# Patient Record
Sex: Female | Born: 1950
Health system: Southern US, Community
[De-identification: ages and names within clinical notes are randomized; demographics above are authoritative.]

## PROBLEM LIST (undated history)

## (undated) DIAGNOSIS — J449 Chronic obstructive pulmonary disease, unspecified: Secondary | ICD-10-CM

## (undated) DIAGNOSIS — E785 Hyperlipidemia, unspecified: Secondary | ICD-10-CM

## (undated) DIAGNOSIS — E1169 Type 2 diabetes mellitus with other specified complication: Secondary | ICD-10-CM

## (undated) DIAGNOSIS — C50919 Malignant neoplasm of unspecified site of unspecified female breast: Secondary | ICD-10-CM

## (undated) DIAGNOSIS — I10 Essential (primary) hypertension: Secondary | ICD-10-CM

---

## 2006-08-09 ENCOUNTER — Ambulatory Visit: Payer: Self-pay | Admitting: Oncology

## 2006-08-14 ENCOUNTER — Ambulatory Visit: Payer: Self-pay | Admitting: Oncology

## 2006-08-17 ENCOUNTER — Ambulatory Visit: Admission: RE | Admit: 2006-08-17 | Discharge: 2006-10-11 | Payer: Self-pay | Admitting: Radiation Oncology

## 2006-11-01 ENCOUNTER — Ambulatory Visit: Payer: Self-pay | Admitting: Oncology

## 2006-11-07 ENCOUNTER — Ambulatory Visit: Admission: RE | Admit: 2006-11-07 | Discharge: 2006-12-19 | Payer: Self-pay | Admitting: Radiation Oncology

## 2007-02-01 ENCOUNTER — Ambulatory Visit: Payer: Self-pay | Admitting: Oncology

## 2007-02-22 ENCOUNTER — Encounter (INDEPENDENT_AMBULATORY_CARE_PROVIDER_SITE_OTHER): Payer: Self-pay | Admitting: Diagnostic Radiology

## 2007-02-22 ENCOUNTER — Encounter: Admission: RE | Admit: 2007-02-22 | Discharge: 2007-02-22 | Payer: Self-pay | Admitting: Oncology

## 2010-05-29 ENCOUNTER — Encounter: Payer: Self-pay | Admitting: Oncology

## 2014-05-22 DIAGNOSIS — Z23 Encounter for immunization: Secondary | ICD-10-CM | POA: Diagnosis not present

## 2014-05-22 DIAGNOSIS — J449 Chronic obstructive pulmonary disease, unspecified: Secondary | ICD-10-CM | POA: Diagnosis not present

## 2014-05-22 DIAGNOSIS — E780 Pure hypercholesterolemia: Secondary | ICD-10-CM | POA: Diagnosis not present

## 2014-05-22 DIAGNOSIS — E119 Type 2 diabetes mellitus without complications: Secondary | ICD-10-CM | POA: Diagnosis not present

## 2014-05-22 DIAGNOSIS — I5032 Chronic diastolic (congestive) heart failure: Secondary | ICD-10-CM | POA: Diagnosis not present

## 2014-05-22 DIAGNOSIS — I10 Essential (primary) hypertension: Secondary | ICD-10-CM | POA: Diagnosis not present

## 2014-08-21 DIAGNOSIS — Z79899 Other long term (current) drug therapy: Secondary | ICD-10-CM | POA: Diagnosis not present

## 2014-08-21 DIAGNOSIS — I10 Essential (primary) hypertension: Secondary | ICD-10-CM | POA: Diagnosis not present

## 2014-08-21 DIAGNOSIS — Z1239 Encounter for other screening for malignant neoplasm of breast: Secondary | ICD-10-CM | POA: Diagnosis not present

## 2014-08-21 DIAGNOSIS — E780 Pure hypercholesterolemia: Secondary | ICD-10-CM | POA: Diagnosis not present

## 2014-08-21 DIAGNOSIS — J449 Chronic obstructive pulmonary disease, unspecified: Secondary | ICD-10-CM | POA: Diagnosis not present

## 2014-08-21 DIAGNOSIS — I5032 Chronic diastolic (congestive) heart failure: Secondary | ICD-10-CM | POA: Diagnosis not present

## 2014-08-21 DIAGNOSIS — E1129 Type 2 diabetes mellitus with other diabetic kidney complication: Secondary | ICD-10-CM | POA: Diagnosis not present

## 2014-08-26 DIAGNOSIS — J9610 Chronic respiratory failure, unspecified whether with hypoxia or hypercapnia: Secondary | ICD-10-CM | POA: Diagnosis not present

## 2014-08-26 DIAGNOSIS — J449 Chronic obstructive pulmonary disease, unspecified: Secondary | ICD-10-CM | POA: Diagnosis not present

## 2014-09-04 DIAGNOSIS — Z1231 Encounter for screening mammogram for malignant neoplasm of breast: Secondary | ICD-10-CM | POA: Diagnosis not present

## 2015-01-14 DIAGNOSIS — M25561 Pain in right knee: Secondary | ICD-10-CM | POA: Diagnosis not present

## 2015-03-12 DIAGNOSIS — Z23 Encounter for immunization: Secondary | ICD-10-CM | POA: Diagnosis not present

## 2015-06-11 DIAGNOSIS — I10 Essential (primary) hypertension: Secondary | ICD-10-CM | POA: Diagnosis not present

## 2015-06-11 DIAGNOSIS — E1165 Type 2 diabetes mellitus with hyperglycemia: Secondary | ICD-10-CM | POA: Diagnosis not present

## 2015-06-11 DIAGNOSIS — E7800 Pure hypercholesterolemia, unspecified: Secondary | ICD-10-CM | POA: Diagnosis not present

## 2015-06-11 DIAGNOSIS — Z79899 Other long term (current) drug therapy: Secondary | ICD-10-CM | POA: Diagnosis not present

## 2015-06-14 DIAGNOSIS — E7800 Pure hypercholesterolemia, unspecified: Secondary | ICD-10-CM | POA: Diagnosis not present

## 2015-06-14 DIAGNOSIS — E1165 Type 2 diabetes mellitus with hyperglycemia: Secondary | ICD-10-CM | POA: Diagnosis not present

## 2015-06-14 DIAGNOSIS — Z79899 Other long term (current) drug therapy: Secondary | ICD-10-CM | POA: Diagnosis not present

## 2015-07-30 DIAGNOSIS — L98491 Non-pressure chronic ulcer of skin of other sites limited to breakdown of skin: Secondary | ICD-10-CM | POA: Diagnosis not present

## 2015-07-30 DIAGNOSIS — E1129 Type 2 diabetes mellitus with other diabetic kidney complication: Secondary | ICD-10-CM | POA: Diagnosis not present

## 2015-08-04 DIAGNOSIS — L98491 Non-pressure chronic ulcer of skin of other sites limited to breakdown of skin: Secondary | ICD-10-CM | POA: Diagnosis not present

## 2015-09-09 DIAGNOSIS — Z1239 Encounter for other screening for malignant neoplasm of breast: Secondary | ICD-10-CM | POA: Diagnosis not present

## 2015-09-09 DIAGNOSIS — I10 Essential (primary) hypertension: Secondary | ICD-10-CM | POA: Diagnosis not present

## 2015-09-09 DIAGNOSIS — E7800 Pure hypercholesterolemia, unspecified: Secondary | ICD-10-CM | POA: Diagnosis not present

## 2015-09-09 DIAGNOSIS — J449 Chronic obstructive pulmonary disease, unspecified: Secondary | ICD-10-CM | POA: Diagnosis not present

## 2015-09-09 DIAGNOSIS — E1129 Type 2 diabetes mellitus with other diabetic kidney complication: Secondary | ICD-10-CM | POA: Diagnosis not present

## 2015-09-14 DIAGNOSIS — Z1231 Encounter for screening mammogram for malignant neoplasm of breast: Secondary | ICD-10-CM | POA: Diagnosis not present

## 2015-09-21 DIAGNOSIS — N63 Unspecified lump in breast: Secondary | ICD-10-CM | POA: Diagnosis not present

## 2015-09-21 DIAGNOSIS — R928 Other abnormal and inconclusive findings on diagnostic imaging of breast: Secondary | ICD-10-CM | POA: Diagnosis not present

## 2015-09-21 DIAGNOSIS — N6489 Other specified disorders of breast: Secondary | ICD-10-CM | POA: Diagnosis not present

## 2015-10-04 DIAGNOSIS — C50512 Malignant neoplasm of lower-outer quadrant of left female breast: Secondary | ICD-10-CM | POA: Diagnosis not present

## 2015-10-04 DIAGNOSIS — N63 Unspecified lump in breast: Secondary | ICD-10-CM | POA: Diagnosis not present

## 2015-10-04 DIAGNOSIS — C50912 Malignant neoplasm of unspecified site of left female breast: Secondary | ICD-10-CM | POA: Diagnosis not present

## 2015-10-25 DIAGNOSIS — Z9889 Other specified postprocedural states: Secondary | ICD-10-CM | POA: Diagnosis not present

## 2015-10-25 DIAGNOSIS — G4733 Obstructive sleep apnea (adult) (pediatric): Secondary | ICD-10-CM | POA: Diagnosis not present

## 2015-10-25 DIAGNOSIS — J439 Emphysema, unspecified: Secondary | ICD-10-CM | POA: Diagnosis not present

## 2015-10-25 DIAGNOSIS — Z6841 Body Mass Index (BMI) 40.0 and over, adult: Secondary | ICD-10-CM | POA: Diagnosis not present

## 2015-10-25 DIAGNOSIS — C50912 Malignant neoplasm of unspecified site of left female breast: Secondary | ICD-10-CM | POA: Diagnosis not present

## 2015-10-25 DIAGNOSIS — Z923 Personal history of irradiation: Secondary | ICD-10-CM | POA: Diagnosis not present

## 2015-10-29 DIAGNOSIS — G4733 Obstructive sleep apnea (adult) (pediatric): Secondary | ICD-10-CM | POA: Insufficient documentation

## 2015-10-29 DIAGNOSIS — Z923 Personal history of irradiation: Secondary | ICD-10-CM | POA: Insufficient documentation

## 2015-10-29 DIAGNOSIS — J439 Emphysema, unspecified: Secondary | ICD-10-CM | POA: Insufficient documentation

## 2015-10-29 DIAGNOSIS — C773 Secondary and unspecified malignant neoplasm of axilla and upper limb lymph nodes: Secondary | ICD-10-CM

## 2015-10-29 DIAGNOSIS — C50912 Malignant neoplasm of unspecified site of left female breast: Secondary | ICD-10-CM | POA: Insufficient documentation

## 2015-10-29 DIAGNOSIS — E118 Type 2 diabetes mellitus with unspecified complications: Secondary | ICD-10-CM | POA: Insufficient documentation

## 2015-10-29 DIAGNOSIS — Z6841 Body Mass Index (BMI) 40.0 and over, adult: Secondary | ICD-10-CM | POA: Insufficient documentation

## 2015-11-01 DIAGNOSIS — Z01818 Encounter for other preprocedural examination: Secondary | ICD-10-CM | POA: Diagnosis not present

## 2015-11-01 DIAGNOSIS — C50912 Malignant neoplasm of unspecified site of left female breast: Secondary | ICD-10-CM | POA: Diagnosis not present

## 2015-11-02 DIAGNOSIS — C50912 Malignant neoplasm of unspecified site of left female breast: Secondary | ICD-10-CM | POA: Diagnosis not present

## 2015-11-02 DIAGNOSIS — E119 Type 2 diabetes mellitus without complications: Secondary | ICD-10-CM | POA: Diagnosis present

## 2015-11-02 DIAGNOSIS — C50512 Malignant neoplasm of lower-outer quadrant of left female breast: Secondary | ICD-10-CM | POA: Diagnosis not present

## 2015-11-02 DIAGNOSIS — G4733 Obstructive sleep apnea (adult) (pediatric): Secondary | ICD-10-CM | POA: Diagnosis present

## 2015-11-02 DIAGNOSIS — Z6841 Body Mass Index (BMI) 40.0 and over, adult: Secondary | ICD-10-CM | POA: Diagnosis not present

## 2015-11-02 DIAGNOSIS — Z794 Long term (current) use of insulin: Secondary | ICD-10-CM | POA: Diagnosis not present

## 2015-11-02 DIAGNOSIS — I10 Essential (primary) hypertension: Secondary | ICD-10-CM | POA: Diagnosis present

## 2015-11-02 DIAGNOSIS — E785 Hyperlipidemia, unspecified: Secondary | ICD-10-CM | POA: Diagnosis present

## 2015-11-02 DIAGNOSIS — Z7982 Long term (current) use of aspirin: Secondary | ICD-10-CM | POA: Diagnosis not present

## 2015-11-02 DIAGNOSIS — Z803 Family history of malignant neoplasm of breast: Secondary | ICD-10-CM | POA: Diagnosis not present

## 2015-11-02 DIAGNOSIS — Z800 Family history of malignant neoplasm of digestive organs: Secondary | ICD-10-CM | POA: Diagnosis not present

## 2015-11-02 DIAGNOSIS — M1990 Unspecified osteoarthritis, unspecified site: Secondary | ICD-10-CM | POA: Diagnosis present

## 2015-11-02 DIAGNOSIS — Z170 Estrogen receptor positive status [ER+]: Secondary | ICD-10-CM | POA: Diagnosis not present

## 2015-11-02 DIAGNOSIS — J449 Chronic obstructive pulmonary disease, unspecified: Secondary | ICD-10-CM | POA: Diagnosis present

## 2015-11-02 DIAGNOSIS — Z01818 Encounter for other preprocedural examination: Secondary | ICD-10-CM | POA: Diagnosis not present

## 2015-11-19 ENCOUNTER — Encounter: Payer: Self-pay | Admitting: Genetic Counselor

## 2015-11-22 DIAGNOSIS — Z86000 Personal history of in-situ neoplasm of breast: Secondary | ICD-10-CM | POA: Diagnosis not present

## 2015-11-22 DIAGNOSIS — Z803 Family history of malignant neoplasm of breast: Secondary | ICD-10-CM | POA: Diagnosis not present

## 2015-11-22 DIAGNOSIS — C50912 Malignant neoplasm of unspecified site of left female breast: Secondary | ICD-10-CM | POA: Diagnosis not present

## 2015-11-22 DIAGNOSIS — C778 Secondary and unspecified malignant neoplasm of lymph nodes of multiple regions: Secondary | ICD-10-CM | POA: Diagnosis not present

## 2015-11-22 DIAGNOSIS — Z800 Family history of malignant neoplasm of digestive organs: Secondary | ICD-10-CM | POA: Diagnosis not present

## 2015-11-22 DIAGNOSIS — C50512 Malignant neoplasm of lower-outer quadrant of left female breast: Secondary | ICD-10-CM | POA: Diagnosis not present

## 2015-11-23 DIAGNOSIS — C50919 Malignant neoplasm of unspecified site of unspecified female breast: Secondary | ICD-10-CM | POA: Diagnosis not present

## 2015-11-23 DIAGNOSIS — C50912 Malignant neoplasm of unspecified site of left female breast: Secondary | ICD-10-CM | POA: Diagnosis not present

## 2015-11-23 DIAGNOSIS — Z9012 Acquired absence of left breast and nipple: Secondary | ICD-10-CM | POA: Diagnosis not present

## 2015-11-29 DIAGNOSIS — G8918 Other acute postprocedural pain: Secondary | ICD-10-CM | POA: Insufficient documentation

## 2015-12-01 DIAGNOSIS — T451X1A Poisoning by antineoplastic and immunosuppressive drugs, accidental (unintentional), initial encounter: Secondary | ICD-10-CM | POA: Diagnosis not present

## 2015-12-01 DIAGNOSIS — I517 Cardiomegaly: Secondary | ICD-10-CM | POA: Diagnosis not present

## 2015-12-01 DIAGNOSIS — M8589 Other specified disorders of bone density and structure, multiple sites: Secondary | ICD-10-CM | POA: Diagnosis not present

## 2015-12-01 DIAGNOSIS — M8588 Other specified disorders of bone density and structure, other site: Secondary | ICD-10-CM | POA: Diagnosis not present

## 2015-12-01 DIAGNOSIS — C50512 Malignant neoplasm of lower-outer quadrant of left female breast: Secondary | ICD-10-CM | POA: Diagnosis not present

## 2015-12-03 DIAGNOSIS — Z853 Personal history of malignant neoplasm of breast: Secondary | ICD-10-CM | POA: Diagnosis not present

## 2015-12-03 DIAGNOSIS — C50512 Malignant neoplasm of lower-outer quadrant of left female breast: Secondary | ICD-10-CM | POA: Diagnosis not present

## 2015-12-09 DIAGNOSIS — E785 Hyperlipidemia, unspecified: Secondary | ICD-10-CM | POA: Diagnosis not present

## 2015-12-09 DIAGNOSIS — I10 Essential (primary) hypertension: Secondary | ICD-10-CM | POA: Diagnosis not present

## 2015-12-09 DIAGNOSIS — Z86000 Personal history of in-situ neoplasm of breast: Secondary | ICD-10-CM | POA: Diagnosis not present

## 2015-12-09 DIAGNOSIS — Z923 Personal history of irradiation: Secondary | ICD-10-CM | POA: Diagnosis not present

## 2015-12-09 DIAGNOSIS — E118 Type 2 diabetes mellitus with unspecified complications: Secondary | ICD-10-CM | POA: Diagnosis not present

## 2015-12-09 DIAGNOSIS — Z79899 Other long term (current) drug therapy: Secondary | ICD-10-CM | POA: Diagnosis not present

## 2015-12-09 DIAGNOSIS — Z6841 Body Mass Index (BMI) 40.0 and over, adult: Secondary | ICD-10-CM | POA: Diagnosis not present

## 2015-12-09 DIAGNOSIS — Z170 Estrogen receptor positive status [ER+]: Secondary | ICD-10-CM | POA: Diagnosis not present

## 2015-12-09 DIAGNOSIS — J439 Emphysema, unspecified: Secondary | ICD-10-CM | POA: Diagnosis not present

## 2015-12-09 DIAGNOSIS — Z7982 Long term (current) use of aspirin: Secondary | ICD-10-CM | POA: Diagnosis not present

## 2015-12-09 DIAGNOSIS — G4733 Obstructive sleep apnea (adult) (pediatric): Secondary | ICD-10-CM | POA: Diagnosis not present

## 2015-12-09 DIAGNOSIS — T85628A Displacement of other specified internal prosthetic devices, implants and grafts, initial encounter: Secondary | ICD-10-CM | POA: Diagnosis not present

## 2015-12-09 DIAGNOSIS — C50912 Malignant neoplasm of unspecified site of left female breast: Secondary | ICD-10-CM | POA: Diagnosis not present

## 2015-12-09 DIAGNOSIS — D485 Neoplasm of uncertain behavior of skin: Secondary | ICD-10-CM | POA: Diagnosis not present

## 2015-12-09 DIAGNOSIS — E669 Obesity, unspecified: Secondary | ICD-10-CM | POA: Diagnosis not present

## 2015-12-09 DIAGNOSIS — Z9981 Dependence on supplemental oxygen: Secondary | ICD-10-CM | POA: Diagnosis not present

## 2015-12-09 DIAGNOSIS — Z7984 Long term (current) use of oral hypoglycemic drugs: Secondary | ICD-10-CM | POA: Diagnosis not present

## 2015-12-09 DIAGNOSIS — D2361 Other benign neoplasm of skin of right upper limb, including shoulder: Secondary | ICD-10-CM | POA: Diagnosis not present

## 2015-12-09 DIAGNOSIS — Z5111 Encounter for antineoplastic chemotherapy: Secondary | ICD-10-CM | POA: Diagnosis not present

## 2015-12-09 DIAGNOSIS — Z9012 Acquired absence of left breast and nipple: Secondary | ICD-10-CM | POA: Diagnosis not present

## 2015-12-09 DIAGNOSIS — D2261 Melanocytic nevi of right upper limb, including shoulder: Secondary | ICD-10-CM | POA: Diagnosis not present

## 2015-12-09 DIAGNOSIS — Z794 Long term (current) use of insulin: Secondary | ICD-10-CM | POA: Diagnosis not present

## 2015-12-09 DIAGNOSIS — C773 Secondary and unspecified malignant neoplasm of axilla and upper limb lymph nodes: Secondary | ICD-10-CM | POA: Diagnosis not present

## 2015-12-10 DIAGNOSIS — T85628A Displacement of other specified internal prosthetic devices, implants and grafts, initial encounter: Secondary | ICD-10-CM | POA: Diagnosis not present

## 2015-12-10 DIAGNOSIS — Z79899 Other long term (current) drug therapy: Secondary | ICD-10-CM | POA: Diagnosis not present

## 2015-12-10 DIAGNOSIS — C773 Secondary and unspecified malignant neoplasm of axilla and upper limb lymph nodes: Secondary | ICD-10-CM | POA: Diagnosis not present

## 2015-12-10 DIAGNOSIS — C50912 Malignant neoplasm of unspecified site of left female breast: Secondary | ICD-10-CM | POA: Diagnosis not present

## 2015-12-10 DIAGNOSIS — E118 Type 2 diabetes mellitus with unspecified complications: Secondary | ICD-10-CM | POA: Diagnosis not present

## 2015-12-10 DIAGNOSIS — D2361 Other benign neoplasm of skin of right upper limb, including shoulder: Secondary | ICD-10-CM | POA: Diagnosis not present

## 2015-12-29 DIAGNOSIS — Z800 Family history of malignant neoplasm of digestive organs: Secondary | ICD-10-CM | POA: Diagnosis not present

## 2015-12-29 DIAGNOSIS — Z803 Family history of malignant neoplasm of breast: Secondary | ICD-10-CM | POA: Diagnosis not present

## 2015-12-29 DIAGNOSIS — C50512 Malignant neoplasm of lower-outer quadrant of left female breast: Secondary | ICD-10-CM | POA: Diagnosis not present

## 2015-12-29 DIAGNOSIS — M8580 Other specified disorders of bone density and structure, unspecified site: Secondary | ICD-10-CM | POA: Diagnosis not present

## 2015-12-29 DIAGNOSIS — C773 Secondary and unspecified malignant neoplasm of axilla and upper limb lymph nodes: Secondary | ICD-10-CM | POA: Diagnosis not present

## 2015-12-29 DIAGNOSIS — Z853 Personal history of malignant neoplasm of breast: Secondary | ICD-10-CM | POA: Diagnosis not present

## 2016-01-04 DIAGNOSIS — C50212 Malignant neoplasm of upper-inner quadrant of left female breast: Secondary | ICD-10-CM | POA: Diagnosis not present

## 2016-01-05 DIAGNOSIS — C50512 Malignant neoplasm of lower-outer quadrant of left female breast: Secondary | ICD-10-CM | POA: Diagnosis not present

## 2016-01-05 DIAGNOSIS — Z8679 Personal history of other diseases of the circulatory system: Secondary | ICD-10-CM | POA: Diagnosis not present

## 2016-01-05 DIAGNOSIS — Z5111 Encounter for antineoplastic chemotherapy: Secondary | ICD-10-CM | POA: Diagnosis not present

## 2016-01-11 DIAGNOSIS — C50512 Malignant neoplasm of lower-outer quadrant of left female breast: Secondary | ICD-10-CM | POA: Diagnosis not present

## 2016-01-11 DIAGNOSIS — R509 Fever, unspecified: Secondary | ICD-10-CM | POA: Diagnosis not present

## 2016-01-12 ENCOUNTER — Other Ambulatory Visit: Payer: Self-pay

## 2016-01-14 DIAGNOSIS — C50512 Malignant neoplasm of lower-outer quadrant of left female breast: Secondary | ICD-10-CM | POA: Diagnosis not present

## 2016-01-19 DIAGNOSIS — Z23 Encounter for immunization: Secondary | ICD-10-CM | POA: Diagnosis not present

## 2016-01-19 DIAGNOSIS — E7800 Pure hypercholesterolemia, unspecified: Secondary | ICD-10-CM | POA: Diagnosis not present

## 2016-01-19 DIAGNOSIS — I83008 Varicose veins of unspecified lower extremity with ulcer other part of lower leg: Secondary | ICD-10-CM | POA: Diagnosis not present

## 2016-01-19 DIAGNOSIS — J449 Chronic obstructive pulmonary disease, unspecified: Secondary | ICD-10-CM | POA: Diagnosis not present

## 2016-01-19 DIAGNOSIS — E1165 Type 2 diabetes mellitus with hyperglycemia: Secondary | ICD-10-CM | POA: Diagnosis not present

## 2016-01-19 DIAGNOSIS — Z853 Personal history of malignant neoplasm of breast: Secondary | ICD-10-CM | POA: Diagnosis not present

## 2016-01-19 DIAGNOSIS — I10 Essential (primary) hypertension: Secondary | ICD-10-CM | POA: Diagnosis not present

## 2016-01-26 DIAGNOSIS — Z794 Long term (current) use of insulin: Secondary | ICD-10-CM | POA: Diagnosis not present

## 2016-01-26 DIAGNOSIS — C50512 Malignant neoplasm of lower-outer quadrant of left female breast: Secondary | ICD-10-CM | POA: Diagnosis not present

## 2016-01-26 DIAGNOSIS — Z7689 Persons encountering health services in other specified circumstances: Secondary | ICD-10-CM | POA: Diagnosis not present

## 2016-01-26 DIAGNOSIS — E119 Type 2 diabetes mellitus without complications: Secondary | ICD-10-CM | POA: Diagnosis not present

## 2016-01-26 DIAGNOSIS — Z86000 Personal history of in-situ neoplasm of breast: Secondary | ICD-10-CM | POA: Diagnosis not present

## 2016-01-26 DIAGNOSIS — C773 Secondary and unspecified malignant neoplasm of axilla and upper limb lymph nodes: Secondary | ICD-10-CM | POA: Diagnosis not present

## 2016-02-05 DIAGNOSIS — A419 Sepsis, unspecified organism: Secondary | ICD-10-CM | POA: Diagnosis not present

## 2016-02-05 DIAGNOSIS — R42 Dizziness and giddiness: Secondary | ICD-10-CM | POA: Diagnosis not present

## 2016-02-05 DIAGNOSIS — C50911 Malignant neoplasm of unspecified site of right female breast: Secondary | ICD-10-CM | POA: Diagnosis not present

## 2016-02-05 DIAGNOSIS — R000 Tachycardia, unspecified: Secondary | ICD-10-CM | POA: Diagnosis not present

## 2016-02-05 DIAGNOSIS — C50912 Malignant neoplasm of unspecified site of left female breast: Secondary | ICD-10-CM | POA: Diagnosis not present

## 2016-02-05 DIAGNOSIS — R197 Diarrhea, unspecified: Secondary | ICD-10-CM | POA: Diagnosis not present

## 2016-02-05 DIAGNOSIS — E860 Dehydration: Secondary | ICD-10-CM | POA: Diagnosis not present

## 2016-02-06 DIAGNOSIS — I471 Supraventricular tachycardia: Secondary | ICD-10-CM | POA: Diagnosis not present

## 2016-02-06 DIAGNOSIS — E119 Type 2 diabetes mellitus without complications: Secondary | ICD-10-CM | POA: Diagnosis not present

## 2016-02-06 DIAGNOSIS — H00015 Hordeolum externum left lower eyelid: Secondary | ICD-10-CM | POA: Diagnosis not present

## 2016-02-06 DIAGNOSIS — I5032 Chronic diastolic (congestive) heart failure: Secondary | ICD-10-CM | POA: Diagnosis not present

## 2016-02-06 DIAGNOSIS — Z9981 Dependence on supplemental oxygen: Secondary | ICD-10-CM | POA: Diagnosis not present

## 2016-02-06 DIAGNOSIS — I872 Venous insufficiency (chronic) (peripheral): Secondary | ICD-10-CM | POA: Diagnosis not present

## 2016-02-06 DIAGNOSIS — R42 Dizziness and giddiness: Secondary | ICD-10-CM | POA: Diagnosis not present

## 2016-02-06 DIAGNOSIS — Z9012 Acquired absence of left breast and nipple: Secondary | ICD-10-CM | POA: Diagnosis not present

## 2016-02-06 DIAGNOSIS — Z79899 Other long term (current) drug therapy: Secondary | ICD-10-CM | POA: Diagnosis not present

## 2016-02-06 DIAGNOSIS — Z6841 Body Mass Index (BMI) 40.0 and over, adult: Secondary | ICD-10-CM | POA: Diagnosis not present

## 2016-02-06 DIAGNOSIS — Z7982 Long term (current) use of aspirin: Secondary | ICD-10-CM | POA: Diagnosis not present

## 2016-02-06 DIAGNOSIS — C50919 Malignant neoplasm of unspecified site of unspecified female breast: Secondary | ICD-10-CM | POA: Diagnosis not present

## 2016-02-06 DIAGNOSIS — J449 Chronic obstructive pulmonary disease, unspecified: Secondary | ICD-10-CM | POA: Diagnosis not present

## 2016-02-06 DIAGNOSIS — I110 Hypertensive heart disease with heart failure: Secondary | ICD-10-CM | POA: Diagnosis not present

## 2016-02-06 DIAGNOSIS — E860 Dehydration: Secondary | ICD-10-CM | POA: Diagnosis not present

## 2016-02-06 DIAGNOSIS — J9610 Chronic respiratory failure, unspecified whether with hypoxia or hypercapnia: Secondary | ICD-10-CM

## 2016-02-06 DIAGNOSIS — T451X5A Adverse effect of antineoplastic and immunosuppressive drugs, initial encounter: Secondary | ICD-10-CM | POA: Diagnosis not present

## 2016-02-06 DIAGNOSIS — I5030 Unspecified diastolic (congestive) heart failure: Secondary | ICD-10-CM | POA: Diagnosis not present

## 2016-02-06 DIAGNOSIS — Z794 Long term (current) use of insulin: Secondary | ICD-10-CM | POA: Diagnosis not present

## 2016-02-06 DIAGNOSIS — R197 Diarrhea, unspecified: Secondary | ICD-10-CM | POA: Diagnosis not present

## 2016-02-06 DIAGNOSIS — N179 Acute kidney failure, unspecified: Secondary | ICD-10-CM | POA: Diagnosis not present

## 2016-02-06 DIAGNOSIS — R112 Nausea with vomiting, unspecified: Secondary | ICD-10-CM | POA: Diagnosis not present

## 2016-02-07 DIAGNOSIS — J9610 Chronic respiratory failure, unspecified whether with hypoxia or hypercapnia: Secondary | ICD-10-CM | POA: Diagnosis not present

## 2016-02-07 DIAGNOSIS — J449 Chronic obstructive pulmonary disease, unspecified: Secondary | ICD-10-CM | POA: Diagnosis not present

## 2016-02-07 DIAGNOSIS — C50919 Malignant neoplasm of unspecified site of unspecified female breast: Secondary | ICD-10-CM | POA: Diagnosis not present

## 2016-02-07 DIAGNOSIS — I5030 Unspecified diastolic (congestive) heart failure: Secondary | ICD-10-CM | POA: Diagnosis not present

## 2016-02-07 DIAGNOSIS — E860 Dehydration: Secondary | ICD-10-CM | POA: Diagnosis not present

## 2016-02-07 DIAGNOSIS — R112 Nausea with vomiting, unspecified: Secondary | ICD-10-CM | POA: Diagnosis not present

## 2016-02-07 DIAGNOSIS — E119 Type 2 diabetes mellitus without complications: Secondary | ICD-10-CM | POA: Diagnosis not present

## 2016-02-07 DIAGNOSIS — R197 Diarrhea, unspecified: Secondary | ICD-10-CM | POA: Diagnosis not present

## 2016-02-07 DIAGNOSIS — N179 Acute kidney failure, unspecified: Secondary | ICD-10-CM | POA: Diagnosis not present

## 2016-02-14 DIAGNOSIS — H1033 Unspecified acute conjunctivitis, bilateral: Secondary | ICD-10-CM | POA: Diagnosis not present

## 2016-02-16 DIAGNOSIS — K521 Toxic gastroenteritis and colitis: Secondary | ICD-10-CM

## 2016-02-16 DIAGNOSIS — Z86000 Personal history of in-situ neoplasm of breast: Secondary | ICD-10-CM | POA: Diagnosis not present

## 2016-02-16 DIAGNOSIS — C50512 Malignant neoplasm of lower-outer quadrant of left female breast: Secondary | ICD-10-CM | POA: Diagnosis not present

## 2016-02-16 DIAGNOSIS — D098 Carcinoma in situ of other specified sites: Secondary | ICD-10-CM | POA: Diagnosis not present

## 2016-02-16 DIAGNOSIS — D6481 Anemia due to antineoplastic chemotherapy: Secondary | ICD-10-CM

## 2016-02-16 DIAGNOSIS — E860 Dehydration: Secondary | ICD-10-CM | POA: Diagnosis not present

## 2016-02-16 DIAGNOSIS — D0512 Intraductal carcinoma in situ of left breast: Secondary | ICD-10-CM | POA: Diagnosis not present

## 2016-02-18 DIAGNOSIS — C50912 Malignant neoplasm of unspecified site of left female breast: Secondary | ICD-10-CM | POA: Diagnosis not present

## 2016-02-18 DIAGNOSIS — R918 Other nonspecific abnormal finding of lung field: Secondary | ICD-10-CM | POA: Diagnosis not present

## 2016-02-18 DIAGNOSIS — C50512 Malignant neoplasm of lower-outer quadrant of left female breast: Secondary | ICD-10-CM | POA: Diagnosis not present

## 2016-02-21 DIAGNOSIS — H1033 Unspecified acute conjunctivitis, bilateral: Secondary | ICD-10-CM | POA: Diagnosis not present

## 2016-02-21 DIAGNOSIS — I10 Essential (primary) hypertension: Secondary | ICD-10-CM | POA: Diagnosis not present

## 2016-02-21 DIAGNOSIS — S90412A Abrasion, left great toe, initial encounter: Secondary | ICD-10-CM | POA: Diagnosis not present

## 2016-02-21 DIAGNOSIS — E860 Dehydration: Secondary | ICD-10-CM | POA: Diagnosis not present

## 2016-02-23 DIAGNOSIS — C50512 Malignant neoplasm of lower-outer quadrant of left female breast: Secondary | ICD-10-CM | POA: Diagnosis not present

## 2016-02-23 DIAGNOSIS — Z5111 Encounter for antineoplastic chemotherapy: Secondary | ICD-10-CM | POA: Diagnosis not present

## 2016-02-26 DIAGNOSIS — E7800 Pure hypercholesterolemia, unspecified: Secondary | ICD-10-CM | POA: Diagnosis not present

## 2016-02-26 DIAGNOSIS — C50919 Malignant neoplasm of unspecified site of unspecified female breast: Secondary | ICD-10-CM | POA: Diagnosis not present

## 2016-02-26 DIAGNOSIS — E119 Type 2 diabetes mellitus without complications: Secondary | ICD-10-CM | POA: Diagnosis not present

## 2016-02-26 DIAGNOSIS — Z79899 Other long term (current) drug therapy: Secondary | ICD-10-CM | POA: Diagnosis not present

## 2016-02-26 DIAGNOSIS — N189 Chronic kidney disease, unspecified: Secondary | ICD-10-CM | POA: Diagnosis not present

## 2016-02-26 DIAGNOSIS — J449 Chronic obstructive pulmonary disease, unspecified: Secondary | ICD-10-CM | POA: Diagnosis not present

## 2016-02-26 DIAGNOSIS — E11621 Type 2 diabetes mellitus with foot ulcer: Secondary | ICD-10-CM | POA: Diagnosis not present

## 2016-02-26 DIAGNOSIS — Z6841 Body Mass Index (BMI) 40.0 and over, adult: Secondary | ICD-10-CM | POA: Diagnosis not present

## 2016-02-26 DIAGNOSIS — I4892 Unspecified atrial flutter: Secondary | ICD-10-CM | POA: Diagnosis not present

## 2016-02-26 DIAGNOSIS — A419 Sepsis, unspecified organism: Secondary | ICD-10-CM | POA: Diagnosis not present

## 2016-02-26 DIAGNOSIS — R002 Palpitations: Secondary | ICD-10-CM | POA: Diagnosis not present

## 2016-02-26 DIAGNOSIS — L03032 Cellulitis of left toe: Secondary | ICD-10-CM | POA: Diagnosis not present

## 2016-02-26 DIAGNOSIS — R000 Tachycardia, unspecified: Secondary | ICD-10-CM | POA: Diagnosis not present

## 2016-02-26 DIAGNOSIS — C50911 Malignant neoplasm of unspecified site of right female breast: Secondary | ICD-10-CM | POA: Diagnosis not present

## 2016-02-26 DIAGNOSIS — I5030 Unspecified diastolic (congestive) heart failure: Secondary | ICD-10-CM | POA: Diagnosis not present

## 2016-02-26 DIAGNOSIS — I471 Supraventricular tachycardia: Secondary | ICD-10-CM | POA: Diagnosis not present

## 2016-02-26 DIAGNOSIS — I10 Essential (primary) hypertension: Secondary | ICD-10-CM | POA: Diagnosis not present

## 2016-02-26 DIAGNOSIS — S99922A Unspecified injury of left foot, initial encounter: Secondary | ICD-10-CM | POA: Diagnosis not present

## 2016-02-26 DIAGNOSIS — L97529 Non-pressure chronic ulcer of other part of left foot with unspecified severity: Secondary | ICD-10-CM | POA: Diagnosis not present

## 2016-02-26 DIAGNOSIS — I130 Hypertensive heart and chronic kidney disease with heart failure and stage 1 through stage 4 chronic kidney disease, or unspecified chronic kidney disease: Secondary | ICD-10-CM | POA: Diagnosis not present

## 2016-02-26 DIAGNOSIS — C50912 Malignant neoplasm of unspecified site of left female breast: Secondary | ICD-10-CM | POA: Diagnosis not present

## 2016-02-26 DIAGNOSIS — Z794 Long term (current) use of insulin: Secondary | ICD-10-CM | POA: Diagnosis not present

## 2016-02-26 DIAGNOSIS — R110 Nausea: Secondary | ICD-10-CM | POA: Diagnosis not present

## 2016-02-26 DIAGNOSIS — D72829 Elevated white blood cell count, unspecified: Secondary | ICD-10-CM | POA: Diagnosis not present

## 2016-02-26 DIAGNOSIS — I5032 Chronic diastolic (congestive) heart failure: Secondary | ICD-10-CM | POA: Diagnosis not present

## 2016-02-26 DIAGNOSIS — E1122 Type 2 diabetes mellitus with diabetic chronic kidney disease: Secondary | ICD-10-CM | POA: Diagnosis not present

## 2016-02-27 DIAGNOSIS — D72829 Elevated white blood cell count, unspecified: Secondary | ICD-10-CM | POA: Diagnosis not present

## 2016-02-27 DIAGNOSIS — E119 Type 2 diabetes mellitus without complications: Secondary | ICD-10-CM | POA: Diagnosis not present

## 2016-02-27 DIAGNOSIS — I10 Essential (primary) hypertension: Secondary | ICD-10-CM | POA: Diagnosis not present

## 2016-02-27 DIAGNOSIS — L03032 Cellulitis of left toe: Secondary | ICD-10-CM | POA: Diagnosis not present

## 2016-02-27 DIAGNOSIS — I5030 Unspecified diastolic (congestive) heart failure: Secondary | ICD-10-CM | POA: Diagnosis not present

## 2016-02-27 DIAGNOSIS — I471 Supraventricular tachycardia: Secondary | ICD-10-CM | POA: Diagnosis not present

## 2016-02-27 DIAGNOSIS — J449 Chronic obstructive pulmonary disease, unspecified: Secondary | ICD-10-CM | POA: Diagnosis not present

## 2016-02-27 DIAGNOSIS — C50919 Malignant neoplasm of unspecified site of unspecified female breast: Secondary | ICD-10-CM | POA: Diagnosis not present

## 2016-02-28 DIAGNOSIS — D72829 Elevated white blood cell count, unspecified: Secondary | ICD-10-CM | POA: Diagnosis not present

## 2016-02-28 DIAGNOSIS — I471 Supraventricular tachycardia: Secondary | ICD-10-CM | POA: Diagnosis not present

## 2016-02-28 DIAGNOSIS — R000 Tachycardia, unspecified: Secondary | ICD-10-CM | POA: Diagnosis not present

## 2016-02-28 DIAGNOSIS — J449 Chronic obstructive pulmonary disease, unspecified: Secondary | ICD-10-CM | POA: Diagnosis not present

## 2016-02-28 DIAGNOSIS — I10 Essential (primary) hypertension: Secondary | ICD-10-CM | POA: Diagnosis not present

## 2016-02-28 DIAGNOSIS — L03032 Cellulitis of left toe: Secondary | ICD-10-CM | POA: Diagnosis not present

## 2016-02-28 DIAGNOSIS — I5030 Unspecified diastolic (congestive) heart failure: Secondary | ICD-10-CM | POA: Diagnosis not present

## 2016-02-28 DIAGNOSIS — E119 Type 2 diabetes mellitus without complications: Secondary | ICD-10-CM | POA: Diagnosis not present

## 2016-02-28 DIAGNOSIS — C50919 Malignant neoplasm of unspecified site of unspecified female breast: Secondary | ICD-10-CM | POA: Diagnosis not present

## 2016-02-29 DIAGNOSIS — I491 Atrial premature depolarization: Secondary | ICD-10-CM | POA: Diagnosis not present

## 2016-02-29 DIAGNOSIS — I471 Supraventricular tachycardia: Secondary | ICD-10-CM | POA: Diagnosis not present

## 2016-02-29 DIAGNOSIS — I480 Paroxysmal atrial fibrillation: Secondary | ICD-10-CM | POA: Diagnosis not present

## 2016-03-01 DIAGNOSIS — I319 Disease of pericardium, unspecified: Secondary | ICD-10-CM | POA: Diagnosis not present

## 2016-03-01 DIAGNOSIS — I350 Nonrheumatic aortic (valve) stenosis: Secondary | ICD-10-CM | POA: Diagnosis not present

## 2016-03-01 DIAGNOSIS — I5030 Unspecified diastolic (congestive) heart failure: Secondary | ICD-10-CM | POA: Diagnosis not present

## 2016-03-01 DIAGNOSIS — C50919 Malignant neoplasm of unspecified site of unspecified female breast: Secondary | ICD-10-CM | POA: Diagnosis not present

## 2016-03-01 DIAGNOSIS — I471 Supraventricular tachycardia: Secondary | ICD-10-CM | POA: Diagnosis not present

## 2016-03-01 DIAGNOSIS — I480 Paroxysmal atrial fibrillation: Secondary | ICD-10-CM | POA: Diagnosis not present

## 2016-03-01 DIAGNOSIS — E119 Type 2 diabetes mellitus without complications: Secondary | ICD-10-CM | POA: Diagnosis not present

## 2016-03-01 DIAGNOSIS — I10 Essential (primary) hypertension: Secondary | ICD-10-CM | POA: Diagnosis not present

## 2016-03-01 DIAGNOSIS — J449 Chronic obstructive pulmonary disease, unspecified: Secondary | ICD-10-CM | POA: Diagnosis not present

## 2016-03-01 DIAGNOSIS — L03032 Cellulitis of left toe: Secondary | ICD-10-CM | POA: Diagnosis not present

## 2016-03-01 DIAGNOSIS — I119 Hypertensive heart disease without heart failure: Secondary | ICD-10-CM | POA: Diagnosis not present

## 2016-03-01 DIAGNOSIS — D72829 Elevated white blood cell count, unspecified: Secondary | ICD-10-CM | POA: Diagnosis not present

## 2016-03-01 DIAGNOSIS — I361 Nonrheumatic tricuspid (valve) insufficiency: Secondary | ICD-10-CM | POA: Diagnosis not present

## 2016-03-03 DIAGNOSIS — C50512 Malignant neoplasm of lower-outer quadrant of left female breast: Secondary | ICD-10-CM | POA: Diagnosis not present

## 2016-03-08 DIAGNOSIS — C50512 Malignant neoplasm of lower-outer quadrant of left female breast: Secondary | ICD-10-CM | POA: Diagnosis not present

## 2016-03-15 DIAGNOSIS — C773 Secondary and unspecified malignant neoplasm of axilla and upper limb lymph nodes: Secondary | ICD-10-CM | POA: Diagnosis not present

## 2016-03-15 DIAGNOSIS — H109 Unspecified conjunctivitis: Secondary | ICD-10-CM | POA: Diagnosis not present

## 2016-03-15 DIAGNOSIS — C50512 Malignant neoplasm of lower-outer quadrant of left female breast: Secondary | ICD-10-CM | POA: Diagnosis not present

## 2016-03-15 DIAGNOSIS — D6481 Anemia due to antineoplastic chemotherapy: Secondary | ICD-10-CM | POA: Diagnosis not present

## 2016-03-15 DIAGNOSIS — M8580 Other specified disorders of bone density and structure, unspecified site: Secondary | ICD-10-CM | POA: Diagnosis not present

## 2016-03-15 DIAGNOSIS — Z79899 Other long term (current) drug therapy: Secondary | ICD-10-CM | POA: Diagnosis not present

## 2016-03-15 DIAGNOSIS — Z86000 Personal history of in-situ neoplasm of breast: Secondary | ICD-10-CM

## 2016-03-15 DIAGNOSIS — R300 Dysuria: Secondary | ICD-10-CM | POA: Diagnosis not present

## 2016-03-15 DIAGNOSIS — Z0001 Encounter for general adult medical examination with abnormal findings: Secondary | ICD-10-CM | POA: Diagnosis not present

## 2016-03-15 DIAGNOSIS — D649 Anemia, unspecified: Secondary | ICD-10-CM | POA: Diagnosis not present

## 2016-04-03 DIAGNOSIS — I5032 Chronic diastolic (congestive) heart failure: Secondary | ICD-10-CM | POA: Diagnosis not present

## 2016-04-03 DIAGNOSIS — I4892 Unspecified atrial flutter: Secondary | ICD-10-CM | POA: Diagnosis not present

## 2016-04-03 DIAGNOSIS — I10 Essential (primary) hypertension: Secondary | ICD-10-CM | POA: Diagnosis not present

## 2016-04-03 DIAGNOSIS — R0602 Shortness of breath: Secondary | ICD-10-CM | POA: Diagnosis not present

## 2016-04-05 DIAGNOSIS — C50512 Malignant neoplasm of lower-outer quadrant of left female breast: Secondary | ICD-10-CM | POA: Diagnosis not present

## 2016-04-13 DIAGNOSIS — I10 Essential (primary) hypertension: Secondary | ICD-10-CM | POA: Diagnosis not present

## 2016-04-13 DIAGNOSIS — Z6841 Body Mass Index (BMI) 40.0 and over, adult: Secondary | ICD-10-CM | POA: Diagnosis not present

## 2016-04-13 DIAGNOSIS — Z79899 Other long term (current) drug therapy: Secondary | ICD-10-CM | POA: Diagnosis not present

## 2016-04-13 DIAGNOSIS — J449 Chronic obstructive pulmonary disease, unspecified: Secondary | ICD-10-CM | POA: Diagnosis not present

## 2016-04-13 DIAGNOSIS — M25561 Pain in right knee: Secondary | ICD-10-CM | POA: Diagnosis not present

## 2016-04-13 DIAGNOSIS — Z0000 Encounter for general adult medical examination without abnormal findings: Secondary | ICD-10-CM | POA: Diagnosis not present

## 2016-04-13 DIAGNOSIS — Z23 Encounter for immunization: Secondary | ICD-10-CM | POA: Diagnosis not present

## 2016-04-13 DIAGNOSIS — E7800 Pure hypercholesterolemia, unspecified: Secondary | ICD-10-CM | POA: Diagnosis not present

## 2016-04-13 DIAGNOSIS — E1165 Type 2 diabetes mellitus with hyperglycemia: Secondary | ICD-10-CM | POA: Diagnosis not present

## 2016-04-26 DIAGNOSIS — C50512 Malignant neoplasm of lower-outer quadrant of left female breast: Secondary | ICD-10-CM | POA: Diagnosis not present

## 2016-05-17 DIAGNOSIS — Z853 Personal history of malignant neoplasm of breast: Secondary | ICD-10-CM | POA: Diagnosis not present

## 2016-05-17 DIAGNOSIS — Z79811 Long term (current) use of aromatase inhibitors: Secondary | ICD-10-CM | POA: Diagnosis not present

## 2016-05-18 DIAGNOSIS — C50512 Malignant neoplasm of lower-outer quadrant of left female breast: Secondary | ICD-10-CM | POA: Diagnosis not present

## 2016-05-18 DIAGNOSIS — I071 Rheumatic tricuspid insufficiency: Secondary | ICD-10-CM | POA: Diagnosis not present

## 2016-05-18 DIAGNOSIS — T451X1A Poisoning by antineoplastic and immunosuppressive drugs, accidental (unintentional), initial encounter: Secondary | ICD-10-CM | POA: Diagnosis not present

## 2016-05-19 DIAGNOSIS — C50512 Malignant neoplasm of lower-outer quadrant of left female breast: Secondary | ICD-10-CM | POA: Diagnosis not present

## 2016-05-19 DIAGNOSIS — Z5111 Encounter for antineoplastic chemotherapy: Secondary | ICD-10-CM | POA: Diagnosis not present

## 2016-05-29 DIAGNOSIS — L03116 Cellulitis of left lower limb: Secondary | ICD-10-CM | POA: Diagnosis not present

## 2016-05-29 DIAGNOSIS — L03119 Cellulitis of unspecified part of limb: Secondary | ICD-10-CM | POA: Diagnosis not present

## 2016-05-29 DIAGNOSIS — I83008 Varicose veins of unspecified lower extremity with ulcer other part of lower leg: Secondary | ICD-10-CM | POA: Diagnosis not present

## 2016-06-02 DIAGNOSIS — L03116 Cellulitis of left lower limb: Secondary | ICD-10-CM | POA: Diagnosis not present

## 2016-06-02 DIAGNOSIS — I83008 Varicose veins of unspecified lower extremity with ulcer other part of lower leg: Secondary | ICD-10-CM | POA: Diagnosis not present

## 2016-06-07 DIAGNOSIS — Z5111 Encounter for antineoplastic chemotherapy: Secondary | ICD-10-CM | POA: Diagnosis not present

## 2016-06-07 DIAGNOSIS — C50512 Malignant neoplasm of lower-outer quadrant of left female breast: Secondary | ICD-10-CM | POA: Diagnosis not present

## 2016-06-08 DIAGNOSIS — I110 Hypertensive heart disease with heart failure: Secondary | ICD-10-CM | POA: Diagnosis not present

## 2016-06-08 DIAGNOSIS — M1990 Unspecified osteoarthritis, unspecified site: Secondary | ICD-10-CM | POA: Diagnosis not present

## 2016-06-08 DIAGNOSIS — J449 Chronic obstructive pulmonary disease, unspecified: Secondary | ICD-10-CM | POA: Diagnosis not present

## 2016-06-08 DIAGNOSIS — Z794 Long term (current) use of insulin: Secondary | ICD-10-CM | POA: Diagnosis not present

## 2016-06-08 DIAGNOSIS — C50919 Malignant neoplasm of unspecified site of unspecified female breast: Secondary | ICD-10-CM | POA: Diagnosis not present

## 2016-06-08 DIAGNOSIS — L97822 Non-pressure chronic ulcer of other part of left lower leg with fat layer exposed: Secondary | ICD-10-CM | POA: Diagnosis not present

## 2016-06-08 DIAGNOSIS — I509 Heart failure, unspecified: Secondary | ICD-10-CM | POA: Diagnosis not present

## 2016-06-08 DIAGNOSIS — Z79899 Other long term (current) drug therapy: Secondary | ICD-10-CM | POA: Diagnosis not present

## 2016-06-08 DIAGNOSIS — I87312 Chronic venous hypertension (idiopathic) with ulcer of left lower extremity: Secondary | ICD-10-CM | POA: Diagnosis not present

## 2016-06-08 DIAGNOSIS — E11622 Type 2 diabetes mellitus with other skin ulcer: Secondary | ICD-10-CM | POA: Diagnosis not present

## 2016-06-08 DIAGNOSIS — J45909 Unspecified asthma, uncomplicated: Secondary | ICD-10-CM | POA: Diagnosis not present

## 2016-06-13 DIAGNOSIS — R05 Cough: Secondary | ICD-10-CM | POA: Diagnosis not present

## 2016-06-13 DIAGNOSIS — R509 Fever, unspecified: Secondary | ICD-10-CM | POA: Diagnosis not present

## 2016-06-13 DIAGNOSIS — R0602 Shortness of breath: Secondary | ICD-10-CM | POA: Diagnosis not present

## 2016-06-13 DIAGNOSIS — C50512 Malignant neoplasm of lower-outer quadrant of left female breast: Secondary | ICD-10-CM | POA: Diagnosis not present

## 2016-06-15 DIAGNOSIS — I87312 Chronic venous hypertension (idiopathic) with ulcer of left lower extremity: Secondary | ICD-10-CM | POA: Diagnosis not present

## 2016-06-15 DIAGNOSIS — I872 Venous insufficiency (chronic) (peripheral): Secondary | ICD-10-CM | POA: Diagnosis not present

## 2016-06-15 DIAGNOSIS — L97822 Non-pressure chronic ulcer of other part of left lower leg with fat layer exposed: Secondary | ICD-10-CM | POA: Diagnosis not present

## 2016-06-15 DIAGNOSIS — L97821 Non-pressure chronic ulcer of other part of left lower leg limited to breakdown of skin: Secondary | ICD-10-CM | POA: Diagnosis not present

## 2016-06-20 DIAGNOSIS — J449 Chronic obstructive pulmonary disease, unspecified: Secondary | ICD-10-CM | POA: Diagnosis not present

## 2016-06-20 DIAGNOSIS — L97822 Non-pressure chronic ulcer of other part of left lower leg with fat layer exposed: Secondary | ICD-10-CM | POA: Diagnosis not present

## 2016-06-20 DIAGNOSIS — I87312 Chronic venous hypertension (idiopathic) with ulcer of left lower extremity: Secondary | ICD-10-CM | POA: Diagnosis not present

## 2016-06-20 DIAGNOSIS — I110 Hypertensive heart disease with heart failure: Secondary | ICD-10-CM | POA: Diagnosis not present

## 2016-06-20 DIAGNOSIS — J45909 Unspecified asthma, uncomplicated: Secondary | ICD-10-CM | POA: Diagnosis not present

## 2016-06-20 DIAGNOSIS — E11621 Type 2 diabetes mellitus with foot ulcer: Secondary | ICD-10-CM | POA: Diagnosis not present

## 2016-06-20 DIAGNOSIS — L97222 Non-pressure chronic ulcer of left calf with fat layer exposed: Secondary | ICD-10-CM | POA: Diagnosis not present

## 2016-06-20 DIAGNOSIS — M1990 Unspecified osteoarthritis, unspecified site: Secondary | ICD-10-CM | POA: Diagnosis not present

## 2016-06-20 DIAGNOSIS — I509 Heart failure, unspecified: Secondary | ICD-10-CM | POA: Diagnosis not present

## 2016-06-20 DIAGNOSIS — Z9221 Personal history of antineoplastic chemotherapy: Secondary | ICD-10-CM | POA: Diagnosis not present

## 2016-06-20 DIAGNOSIS — I87321 Chronic venous hypertension (idiopathic) with inflammation of right lower extremity: Secondary | ICD-10-CM | POA: Diagnosis not present

## 2016-06-20 DIAGNOSIS — L97129 Non-pressure chronic ulcer of left thigh with unspecified severity: Secondary | ICD-10-CM | POA: Diagnosis not present

## 2016-06-26 DIAGNOSIS — I87312 Chronic venous hypertension (idiopathic) with ulcer of left lower extremity: Secondary | ICD-10-CM | POA: Diagnosis not present

## 2016-06-26 DIAGNOSIS — Z09 Encounter for follow-up examination after completed treatment for conditions other than malignant neoplasm: Secondary | ICD-10-CM | POA: Diagnosis not present

## 2016-06-26 DIAGNOSIS — L97829 Non-pressure chronic ulcer of other part of left lower leg with unspecified severity: Secondary | ICD-10-CM | POA: Diagnosis not present

## 2016-06-28 DIAGNOSIS — C50512 Malignant neoplasm of lower-outer quadrant of left female breast: Secondary | ICD-10-CM | POA: Diagnosis not present

## 2016-06-29 DIAGNOSIS — I87312 Chronic venous hypertension (idiopathic) with ulcer of left lower extremity: Secondary | ICD-10-CM | POA: Diagnosis not present

## 2016-06-29 DIAGNOSIS — I10 Essential (primary) hypertension: Secondary | ICD-10-CM | POA: Diagnosis not present

## 2016-06-29 DIAGNOSIS — I70202 Unspecified atherosclerosis of native arteries of extremities, left leg: Secondary | ICD-10-CM | POA: Diagnosis not present

## 2016-07-19 DIAGNOSIS — C773 Secondary and unspecified malignant neoplasm of axilla and upper limb lymph nodes: Secondary | ICD-10-CM | POA: Diagnosis not present

## 2016-07-19 DIAGNOSIS — M25562 Pain in left knee: Secondary | ICD-10-CM | POA: Diagnosis not present

## 2016-07-19 DIAGNOSIS — C50512 Malignant neoplasm of lower-outer quadrant of left female breast: Secondary | ICD-10-CM | POA: Diagnosis not present

## 2016-07-19 DIAGNOSIS — M25561 Pain in right knee: Secondary | ICD-10-CM | POA: Diagnosis not present

## 2016-07-19 DIAGNOSIS — M8580 Other specified disorders of bone density and structure, unspecified site: Secondary | ICD-10-CM | POA: Diagnosis not present

## 2016-07-19 DIAGNOSIS — Z86000 Personal history of in-situ neoplasm of breast: Secondary | ICD-10-CM | POA: Diagnosis not present

## 2016-07-19 DIAGNOSIS — C50912 Malignant neoplasm of unspecified site of left female breast: Secondary | ICD-10-CM | POA: Diagnosis not present

## 2016-07-19 DIAGNOSIS — Z800 Family history of malignant neoplasm of digestive organs: Secondary | ICD-10-CM | POA: Diagnosis not present

## 2016-07-31 DIAGNOSIS — I872 Venous insufficiency (chronic) (peripheral): Secondary | ICD-10-CM | POA: Diagnosis not present

## 2016-07-31 DIAGNOSIS — L97811 Non-pressure chronic ulcer of other part of right lower leg limited to breakdown of skin: Secondary | ICD-10-CM | POA: Diagnosis not present

## 2016-07-31 DIAGNOSIS — L97822 Non-pressure chronic ulcer of other part of left lower leg with fat layer exposed: Secondary | ICD-10-CM | POA: Diagnosis not present

## 2016-07-31 DIAGNOSIS — L97821 Non-pressure chronic ulcer of other part of left lower leg limited to breakdown of skin: Secondary | ICD-10-CM | POA: Diagnosis not present

## 2016-07-31 DIAGNOSIS — I87313 Chronic venous hypertension (idiopathic) with ulcer of bilateral lower extremity: Secondary | ICD-10-CM | POA: Diagnosis not present

## 2016-07-31 DIAGNOSIS — I890 Lymphedema, not elsewhere classified: Secondary | ICD-10-CM | POA: Diagnosis not present

## 2016-08-07 DIAGNOSIS — L97821 Non-pressure chronic ulcer of other part of left lower leg limited to breakdown of skin: Secondary | ICD-10-CM | POA: Diagnosis not present

## 2016-08-07 DIAGNOSIS — L97822 Non-pressure chronic ulcer of other part of left lower leg with fat layer exposed: Secondary | ICD-10-CM | POA: Diagnosis not present

## 2016-08-07 DIAGNOSIS — I872 Venous insufficiency (chronic) (peripheral): Secondary | ICD-10-CM | POA: Diagnosis not present

## 2016-08-07 DIAGNOSIS — I890 Lymphedema, not elsewhere classified: Secondary | ICD-10-CM | POA: Diagnosis not present

## 2016-08-07 DIAGNOSIS — I87312 Chronic venous hypertension (idiopathic) with ulcer of left lower extremity: Secondary | ICD-10-CM | POA: Diagnosis not present

## 2016-08-09 DIAGNOSIS — C50212 Malignant neoplasm of upper-inner quadrant of left female breast: Secondary | ICD-10-CM | POA: Diagnosis not present

## 2016-08-14 DIAGNOSIS — I872 Venous insufficiency (chronic) (peripheral): Secondary | ICD-10-CM | POA: Diagnosis not present

## 2016-08-14 DIAGNOSIS — I890 Lymphedema, not elsewhere classified: Secondary | ICD-10-CM | POA: Diagnosis not present

## 2016-08-14 DIAGNOSIS — L97822 Non-pressure chronic ulcer of other part of left lower leg with fat layer exposed: Secondary | ICD-10-CM | POA: Diagnosis not present

## 2016-08-14 DIAGNOSIS — I87312 Chronic venous hypertension (idiopathic) with ulcer of left lower extremity: Secondary | ICD-10-CM | POA: Diagnosis not present

## 2016-08-14 DIAGNOSIS — L97821 Non-pressure chronic ulcer of other part of left lower leg limited to breakdown of skin: Secondary | ICD-10-CM | POA: Diagnosis not present

## 2016-08-29 DIAGNOSIS — I361 Nonrheumatic tricuspid (valve) insufficiency: Secondary | ICD-10-CM | POA: Diagnosis not present

## 2016-08-29 DIAGNOSIS — C50512 Malignant neoplasm of lower-outer quadrant of left female breast: Secondary | ICD-10-CM | POA: Diagnosis not present

## 2016-08-29 DIAGNOSIS — L97829 Non-pressure chronic ulcer of other part of left lower leg with unspecified severity: Secondary | ICD-10-CM | POA: Diagnosis not present

## 2016-08-29 DIAGNOSIS — I071 Rheumatic tricuspid insufficiency: Secondary | ICD-10-CM | POA: Diagnosis not present

## 2016-08-29 DIAGNOSIS — T451X1D Poisoning by antineoplastic and immunosuppressive drugs, accidental (unintentional), subsequent encounter: Secondary | ICD-10-CM | POA: Diagnosis not present

## 2016-08-29 DIAGNOSIS — I358 Other nonrheumatic aortic valve disorders: Secondary | ICD-10-CM | POA: Diagnosis not present

## 2016-08-29 DIAGNOSIS — I272 Pulmonary hypertension, unspecified: Secondary | ICD-10-CM | POA: Diagnosis not present

## 2016-08-29 DIAGNOSIS — I890 Lymphedema, not elsewhere classified: Secondary | ICD-10-CM | POA: Diagnosis not present

## 2016-08-29 DIAGNOSIS — I87312 Chronic venous hypertension (idiopathic) with ulcer of left lower extremity: Secondary | ICD-10-CM | POA: Diagnosis not present

## 2016-08-30 DIAGNOSIS — C50512 Malignant neoplasm of lower-outer quadrant of left female breast: Secondary | ICD-10-CM | POA: Diagnosis not present

## 2016-08-30 DIAGNOSIS — R609 Edema, unspecified: Secondary | ICD-10-CM | POA: Diagnosis not present

## 2016-08-30 DIAGNOSIS — C50912 Malignant neoplasm of unspecified site of left female breast: Secondary | ICD-10-CM | POA: Diagnosis not present

## 2016-08-30 DIAGNOSIS — Z9012 Acquired absence of left breast and nipple: Secondary | ICD-10-CM | POA: Diagnosis not present

## 2016-08-30 DIAGNOSIS — Z170 Estrogen receptor positive status [ER+]: Secondary | ICD-10-CM | POA: Diagnosis not present

## 2016-08-30 DIAGNOSIS — B372 Candidiasis of skin and nail: Secondary | ICD-10-CM | POA: Diagnosis not present

## 2016-09-05 DIAGNOSIS — I87312 Chronic venous hypertension (idiopathic) with ulcer of left lower extremity: Secondary | ICD-10-CM | POA: Diagnosis not present

## 2016-09-05 DIAGNOSIS — L97222 Non-pressure chronic ulcer of left calf with fat layer exposed: Secondary | ICD-10-CM | POA: Diagnosis not present

## 2016-09-05 DIAGNOSIS — I872 Venous insufficiency (chronic) (peripheral): Secondary | ICD-10-CM | POA: Diagnosis not present

## 2016-09-05 DIAGNOSIS — L97821 Non-pressure chronic ulcer of other part of left lower leg limited to breakdown of skin: Secondary | ICD-10-CM | POA: Diagnosis not present

## 2016-09-12 DIAGNOSIS — I890 Lymphedema, not elsewhere classified: Secondary | ICD-10-CM | POA: Diagnosis not present

## 2016-09-12 DIAGNOSIS — I87313 Chronic venous hypertension (idiopathic) with ulcer of bilateral lower extremity: Secondary | ICD-10-CM | POA: Diagnosis not present

## 2016-09-12 DIAGNOSIS — Z09 Encounter for follow-up examination after completed treatment for conditions other than malignant neoplasm: Secondary | ICD-10-CM | POA: Diagnosis not present

## 2016-09-12 DIAGNOSIS — L97819 Non-pressure chronic ulcer of other part of right lower leg with unspecified severity: Secondary | ICD-10-CM | POA: Diagnosis not present

## 2016-09-12 DIAGNOSIS — L97229 Non-pressure chronic ulcer of left calf with unspecified severity: Secondary | ICD-10-CM | POA: Diagnosis not present

## 2016-09-20 DIAGNOSIS — C50419 Malignant neoplasm of upper-outer quadrant of unspecified female breast: Secondary | ICD-10-CM | POA: Diagnosis not present

## 2016-09-20 DIAGNOSIS — C50512 Malignant neoplasm of lower-outer quadrant of left female breast: Secondary | ICD-10-CM | POA: Diagnosis not present

## 2016-09-20 DIAGNOSIS — C773 Secondary and unspecified malignant neoplasm of axilla and upper limb lymph nodes: Secondary | ICD-10-CM | POA: Diagnosis not present

## 2016-10-11 DIAGNOSIS — C50512 Malignant neoplasm of lower-outer quadrant of left female breast: Secondary | ICD-10-CM | POA: Diagnosis not present

## 2016-10-18 DIAGNOSIS — E119 Type 2 diabetes mellitus without complications: Secondary | ICD-10-CM | POA: Diagnosis not present

## 2016-10-18 DIAGNOSIS — L97222 Non-pressure chronic ulcer of left calf with fat layer exposed: Secondary | ICD-10-CM | POA: Diagnosis not present

## 2016-10-18 DIAGNOSIS — I509 Heart failure, unspecified: Secondary | ICD-10-CM | POA: Diagnosis not present

## 2016-10-18 DIAGNOSIS — I110 Hypertensive heart disease with heart failure: Secondary | ICD-10-CM | POA: Diagnosis not present

## 2016-10-18 DIAGNOSIS — L97212 Non-pressure chronic ulcer of right calf with fat layer exposed: Secondary | ICD-10-CM | POA: Diagnosis not present

## 2016-10-18 DIAGNOSIS — J449 Chronic obstructive pulmonary disease, unspecified: Secondary | ICD-10-CM | POA: Diagnosis not present

## 2016-10-18 DIAGNOSIS — I890 Lymphedema, not elsewhere classified: Secondary | ICD-10-CM | POA: Diagnosis not present

## 2016-10-18 DIAGNOSIS — I87313 Chronic venous hypertension (idiopathic) with ulcer of bilateral lower extremity: Secondary | ICD-10-CM | POA: Diagnosis not present

## 2016-10-18 DIAGNOSIS — L97822 Non-pressure chronic ulcer of other part of left lower leg with fat layer exposed: Secondary | ICD-10-CM | POA: Diagnosis not present

## 2016-10-18 DIAGNOSIS — J45909 Unspecified asthma, uncomplicated: Secondary | ICD-10-CM | POA: Diagnosis not present

## 2016-10-18 DIAGNOSIS — L97812 Non-pressure chronic ulcer of other part of right lower leg with fat layer exposed: Secondary | ICD-10-CM | POA: Diagnosis not present

## 2016-10-18 DIAGNOSIS — Z794 Long term (current) use of insulin: Secondary | ICD-10-CM | POA: Diagnosis not present

## 2016-10-18 DIAGNOSIS — M1990 Unspecified osteoarthritis, unspecified site: Secondary | ICD-10-CM | POA: Diagnosis not present

## 2016-10-25 DIAGNOSIS — I872 Venous insufficiency (chronic) (peripheral): Secondary | ICD-10-CM | POA: Diagnosis not present

## 2016-10-25 DIAGNOSIS — E11622 Type 2 diabetes mellitus with other skin ulcer: Secondary | ICD-10-CM | POA: Diagnosis not present

## 2016-10-25 DIAGNOSIS — L97222 Non-pressure chronic ulcer of left calf with fat layer exposed: Secondary | ICD-10-CM | POA: Diagnosis not present

## 2016-10-25 DIAGNOSIS — L97812 Non-pressure chronic ulcer of other part of right lower leg with fat layer exposed: Secondary | ICD-10-CM | POA: Diagnosis not present

## 2016-10-25 DIAGNOSIS — I87313 Chronic venous hypertension (idiopathic) with ulcer of bilateral lower extremity: Secondary | ICD-10-CM | POA: Diagnosis not present

## 2016-10-25 DIAGNOSIS — Z794 Long term (current) use of insulin: Secondary | ICD-10-CM | POA: Diagnosis not present

## 2016-10-25 DIAGNOSIS — L97822 Non-pressure chronic ulcer of other part of left lower leg with fat layer exposed: Secondary | ICD-10-CM | POA: Diagnosis not present

## 2016-10-25 DIAGNOSIS — I890 Lymphedema, not elsewhere classified: Secondary | ICD-10-CM | POA: Diagnosis not present

## 2016-10-25 DIAGNOSIS — L97212 Non-pressure chronic ulcer of right calf with fat layer exposed: Secondary | ICD-10-CM | POA: Diagnosis not present

## 2016-11-01 DIAGNOSIS — C50512 Malignant neoplasm of lower-outer quadrant of left female breast: Secondary | ICD-10-CM | POA: Diagnosis not present

## 2016-11-01 DIAGNOSIS — I87313 Chronic venous hypertension (idiopathic) with ulcer of bilateral lower extremity: Secondary | ICD-10-CM | POA: Diagnosis not present

## 2016-11-01 DIAGNOSIS — L97829 Non-pressure chronic ulcer of other part of left lower leg with unspecified severity: Secondary | ICD-10-CM | POA: Diagnosis not present

## 2016-11-01 DIAGNOSIS — L97819 Non-pressure chronic ulcer of other part of right lower leg with unspecified severity: Secondary | ICD-10-CM | POA: Diagnosis not present

## 2016-11-07 DIAGNOSIS — I890 Lymphedema, not elsewhere classified: Secondary | ICD-10-CM | POA: Diagnosis not present

## 2016-11-07 DIAGNOSIS — L97222 Non-pressure chronic ulcer of left calf with fat layer exposed: Secondary | ICD-10-CM | POA: Diagnosis not present

## 2016-11-07 DIAGNOSIS — L97822 Non-pressure chronic ulcer of other part of left lower leg with fat layer exposed: Secondary | ICD-10-CM | POA: Diagnosis not present

## 2016-11-07 DIAGNOSIS — I87313 Chronic venous hypertension (idiopathic) with ulcer of bilateral lower extremity: Secondary | ICD-10-CM | POA: Diagnosis not present

## 2016-11-07 DIAGNOSIS — I872 Venous insufficiency (chronic) (peripheral): Secondary | ICD-10-CM | POA: Diagnosis not present

## 2016-11-07 DIAGNOSIS — E11622 Type 2 diabetes mellitus with other skin ulcer: Secondary | ICD-10-CM | POA: Diagnosis not present

## 2016-11-13 DIAGNOSIS — I319 Disease of pericardium, unspecified: Secondary | ICD-10-CM | POA: Diagnosis not present

## 2016-11-13 DIAGNOSIS — T451X1D Poisoning by antineoplastic and immunosuppressive drugs, accidental (unintentional), subsequent encounter: Secondary | ICD-10-CM | POA: Diagnosis not present

## 2016-11-13 DIAGNOSIS — C50512 Malignant neoplasm of lower-outer quadrant of left female breast: Secondary | ICD-10-CM | POA: Diagnosis not present

## 2016-11-14 DIAGNOSIS — I872 Venous insufficiency (chronic) (peripheral): Secondary | ICD-10-CM | POA: Diagnosis not present

## 2016-11-14 DIAGNOSIS — I87312 Chronic venous hypertension (idiopathic) with ulcer of left lower extremity: Secondary | ICD-10-CM | POA: Diagnosis not present

## 2016-11-14 DIAGNOSIS — L97222 Non-pressure chronic ulcer of left calf with fat layer exposed: Secondary | ICD-10-CM | POA: Diagnosis not present

## 2016-11-21 DIAGNOSIS — I87311 Chronic venous hypertension (idiopathic) with ulcer of right lower extremity: Secondary | ICD-10-CM | POA: Diagnosis not present

## 2016-11-21 DIAGNOSIS — L97812 Non-pressure chronic ulcer of other part of right lower leg with fat layer exposed: Secondary | ICD-10-CM | POA: Diagnosis not present

## 2016-11-22 DIAGNOSIS — Z9012 Acquired absence of left breast and nipple: Secondary | ICD-10-CM | POA: Diagnosis not present

## 2016-11-22 DIAGNOSIS — C50912 Malignant neoplasm of unspecified site of left female breast: Secondary | ICD-10-CM | POA: Diagnosis not present

## 2016-11-22 DIAGNOSIS — D649 Anemia, unspecified: Secondary | ICD-10-CM | POA: Diagnosis not present

## 2016-11-22 DIAGNOSIS — C50512 Malignant neoplasm of lower-outer quadrant of left female breast: Secondary | ICD-10-CM | POA: Diagnosis not present

## 2016-11-22 DIAGNOSIS — Z170 Estrogen receptor positive status [ER+]: Secondary | ICD-10-CM | POA: Diagnosis not present

## 2016-11-28 DIAGNOSIS — R0902 Hypoxemia: Secondary | ICD-10-CM | POA: Diagnosis not present

## 2016-11-28 DIAGNOSIS — I872 Venous insufficiency (chronic) (peripheral): Secondary | ICD-10-CM | POA: Diagnosis not present

## 2016-11-28 DIAGNOSIS — I87311 Chronic venous hypertension (idiopathic) with ulcer of right lower extremity: Secondary | ICD-10-CM | POA: Diagnosis not present

## 2016-11-28 DIAGNOSIS — R51 Headache: Secondary | ICD-10-CM | POA: Diagnosis not present

## 2016-11-28 DIAGNOSIS — L97812 Non-pressure chronic ulcer of other part of right lower leg with fat layer exposed: Secondary | ICD-10-CM | POA: Diagnosis not present

## 2016-11-28 DIAGNOSIS — R42 Dizziness and giddiness: Secondary | ICD-10-CM | POA: Diagnosis not present

## 2016-11-28 DIAGNOSIS — J449 Chronic obstructive pulmonary disease, unspecified: Secondary | ICD-10-CM | POA: Diagnosis not present

## 2016-12-05 DIAGNOSIS — L97812 Non-pressure chronic ulcer of other part of right lower leg with fat layer exposed: Secondary | ICD-10-CM | POA: Diagnosis not present

## 2016-12-05 DIAGNOSIS — I872 Venous insufficiency (chronic) (peripheral): Secondary | ICD-10-CM | POA: Diagnosis not present

## 2016-12-05 DIAGNOSIS — I87311 Chronic venous hypertension (idiopathic) with ulcer of right lower extremity: Secondary | ICD-10-CM | POA: Diagnosis not present

## 2016-12-05 DIAGNOSIS — I890 Lymphedema, not elsewhere classified: Secondary | ICD-10-CM | POA: Diagnosis not present

## 2016-12-06 DIAGNOSIS — I4819 Other persistent atrial fibrillation: Secondary | ICD-10-CM | POA: Insufficient documentation

## 2016-12-06 DIAGNOSIS — Z6841 Body Mass Index (BMI) 40.0 and over, adult: Secondary | ICD-10-CM | POA: Diagnosis not present

## 2016-12-06 DIAGNOSIS — Z794 Long term (current) use of insulin: Secondary | ICD-10-CM | POA: Diagnosis not present

## 2016-12-06 DIAGNOSIS — J438 Other emphysema: Secondary | ICD-10-CM | POA: Diagnosis not present

## 2016-12-06 DIAGNOSIS — D0511 Intraductal carcinoma in situ of right breast: Secondary | ICD-10-CM | POA: Diagnosis not present

## 2016-12-06 DIAGNOSIS — I481 Persistent atrial fibrillation: Secondary | ICD-10-CM | POA: Diagnosis not present

## 2016-12-06 DIAGNOSIS — E118 Type 2 diabetes mellitus with unspecified complications: Secondary | ICD-10-CM | POA: Diagnosis not present

## 2016-12-06 DIAGNOSIS — I272 Pulmonary hypertension, unspecified: Secondary | ICD-10-CM | POA: Insufficient documentation

## 2016-12-13 DIAGNOSIS — I872 Venous insufficiency (chronic) (peripheral): Secondary | ICD-10-CM | POA: Diagnosis not present

## 2016-12-13 DIAGNOSIS — I87311 Chronic venous hypertension (idiopathic) with ulcer of right lower extremity: Secondary | ICD-10-CM | POA: Diagnosis not present

## 2016-12-13 DIAGNOSIS — C50512 Malignant neoplasm of lower-outer quadrant of left female breast: Secondary | ICD-10-CM | POA: Diagnosis not present

## 2016-12-13 DIAGNOSIS — Z79811 Long term (current) use of aromatase inhibitors: Secondary | ICD-10-CM | POA: Diagnosis not present

## 2016-12-13 DIAGNOSIS — I890 Lymphedema, not elsewhere classified: Secondary | ICD-10-CM | POA: Diagnosis not present

## 2016-12-13 DIAGNOSIS — L97812 Non-pressure chronic ulcer of other part of right lower leg with fat layer exposed: Secondary | ICD-10-CM | POA: Diagnosis not present

## 2016-12-13 DIAGNOSIS — R531 Weakness: Secondary | ICD-10-CM | POA: Diagnosis not present

## 2016-12-25 DIAGNOSIS — Z09 Encounter for follow-up examination after completed treatment for conditions other than malignant neoplasm: Secondary | ICD-10-CM | POA: Diagnosis not present

## 2016-12-25 DIAGNOSIS — L97819 Non-pressure chronic ulcer of other part of right lower leg with unspecified severity: Secondary | ICD-10-CM | POA: Diagnosis not present

## 2016-12-25 DIAGNOSIS — I890 Lymphedema, not elsewhere classified: Secondary | ICD-10-CM | POA: Diagnosis not present

## 2016-12-25 DIAGNOSIS — I87311 Chronic venous hypertension (idiopathic) with ulcer of right lower extremity: Secondary | ICD-10-CM | POA: Diagnosis not present

## 2016-12-31 DIAGNOSIS — J9621 Acute and chronic respiratory failure with hypoxia: Secondary | ICD-10-CM | POA: Diagnosis not present

## 2016-12-31 DIAGNOSIS — J441 Chronic obstructive pulmonary disease with (acute) exacerbation: Secondary | ICD-10-CM | POA: Diagnosis not present

## 2016-12-31 DIAGNOSIS — Z6841 Body Mass Index (BMI) 40.0 and over, adult: Secondary | ICD-10-CM | POA: Diagnosis not present

## 2016-12-31 DIAGNOSIS — I482 Chronic atrial fibrillation: Secondary | ICD-10-CM | POA: Diagnosis present

## 2016-12-31 DIAGNOSIS — I110 Hypertensive heart disease with heart failure: Secondary | ICD-10-CM | POA: Diagnosis present

## 2016-12-31 DIAGNOSIS — J9610 Chronic respiratory failure, unspecified whether with hypoxia or hypercapnia: Secondary | ICD-10-CM | POA: Diagnosis not present

## 2016-12-31 DIAGNOSIS — J189 Pneumonia, unspecified organism: Secondary | ICD-10-CM | POA: Diagnosis not present

## 2016-12-31 DIAGNOSIS — R0602 Shortness of breath: Secondary | ICD-10-CM | POA: Diagnosis not present

## 2016-12-31 DIAGNOSIS — R0603 Acute respiratory distress: Secondary | ICD-10-CM | POA: Diagnosis not present

## 2016-12-31 DIAGNOSIS — I5030 Unspecified diastolic (congestive) heart failure: Secondary | ICD-10-CM | POA: Diagnosis not present

## 2016-12-31 DIAGNOSIS — Z888 Allergy status to other drugs, medicaments and biological substances status: Secondary | ICD-10-CM | POA: Diagnosis not present

## 2016-12-31 DIAGNOSIS — E119 Type 2 diabetes mellitus without complications: Secondary | ICD-10-CM | POA: Diagnosis not present

## 2016-12-31 DIAGNOSIS — Z794 Long term (current) use of insulin: Secondary | ICD-10-CM | POA: Diagnosis not present

## 2016-12-31 DIAGNOSIS — N179 Acute kidney failure, unspecified: Secondary | ICD-10-CM | POA: Diagnosis not present

## 2016-12-31 DIAGNOSIS — Z885 Allergy status to narcotic agent status: Secondary | ICD-10-CM | POA: Diagnosis not present

## 2016-12-31 DIAGNOSIS — Z79899 Other long term (current) drug therapy: Secondary | ICD-10-CM | POA: Diagnosis not present

## 2016-12-31 DIAGNOSIS — I10 Essential (primary) hypertension: Secondary | ICD-10-CM | POA: Diagnosis not present

## 2016-12-31 DIAGNOSIS — C50919 Malignant neoplasm of unspecified site of unspecified female breast: Secondary | ICD-10-CM | POA: Diagnosis not present

## 2016-12-31 DIAGNOSIS — Z9981 Dependence on supplemental oxygen: Secondary | ICD-10-CM | POA: Diagnosis not present

## 2016-12-31 DIAGNOSIS — Z9012 Acquired absence of left breast and nipple: Secondary | ICD-10-CM | POA: Diagnosis not present

## 2016-12-31 DIAGNOSIS — E7800 Pure hypercholesterolemia, unspecified: Secondary | ICD-10-CM | POA: Diagnosis present

## 2016-12-31 DIAGNOSIS — I5032 Chronic diastolic (congestive) heart failure: Secondary | ICD-10-CM | POA: Diagnosis present

## 2016-12-31 DIAGNOSIS — R05 Cough: Secondary | ICD-10-CM | POA: Diagnosis not present

## 2016-12-31 DIAGNOSIS — J449 Chronic obstructive pulmonary disease, unspecified: Secondary | ICD-10-CM | POA: Diagnosis not present

## 2016-12-31 DIAGNOSIS — J440 Chronic obstructive pulmonary disease with acute lower respiratory infection: Secondary | ICD-10-CM | POA: Diagnosis present

## 2016-12-31 DIAGNOSIS — M1990 Unspecified osteoarthritis, unspecified site: Secondary | ICD-10-CM | POA: Diagnosis present

## 2017-01-04 DIAGNOSIS — Z7982 Long term (current) use of aspirin: Secondary | ICD-10-CM | POA: Diagnosis not present

## 2017-01-04 DIAGNOSIS — Z794 Long term (current) use of insulin: Secondary | ICD-10-CM | POA: Diagnosis not present

## 2017-01-04 DIAGNOSIS — I5030 Unspecified diastolic (congestive) heart failure: Secondary | ICD-10-CM | POA: Diagnosis not present

## 2017-01-04 DIAGNOSIS — J9610 Chronic respiratory failure, unspecified whether with hypoxia or hypercapnia: Secondary | ICD-10-CM | POA: Diagnosis not present

## 2017-01-04 DIAGNOSIS — E119 Type 2 diabetes mellitus without complications: Secondary | ICD-10-CM | POA: Diagnosis not present

## 2017-01-04 DIAGNOSIS — C50919 Malignant neoplasm of unspecified site of unspecified female breast: Secondary | ICD-10-CM | POA: Diagnosis not present

## 2017-01-04 DIAGNOSIS — Z9981 Dependence on supplemental oxygen: Secondary | ICD-10-CM | POA: Diagnosis not present

## 2017-01-04 DIAGNOSIS — I110 Hypertensive heart disease with heart failure: Secondary | ICD-10-CM | POA: Diagnosis not present

## 2017-01-04 DIAGNOSIS — J441 Chronic obstructive pulmonary disease with (acute) exacerbation: Secondary | ICD-10-CM | POA: Diagnosis not present

## 2017-01-04 DIAGNOSIS — J189 Pneumonia, unspecified organism: Secondary | ICD-10-CM | POA: Diagnosis not present

## 2017-01-04 DIAGNOSIS — J440 Chronic obstructive pulmonary disease with acute lower respiratory infection: Secondary | ICD-10-CM | POA: Diagnosis not present

## 2017-01-05 DIAGNOSIS — Z79811 Long term (current) use of aromatase inhibitors: Secondary | ICD-10-CM | POA: Diagnosis not present

## 2017-01-05 DIAGNOSIS — J441 Chronic obstructive pulmonary disease with (acute) exacerbation: Secondary | ICD-10-CM | POA: Diagnosis not present

## 2017-01-05 DIAGNOSIS — D649 Anemia, unspecified: Secondary | ICD-10-CM | POA: Diagnosis not present

## 2017-01-05 DIAGNOSIS — C50919 Malignant neoplasm of unspecified site of unspecified female breast: Secondary | ICD-10-CM | POA: Diagnosis not present

## 2017-01-05 DIAGNOSIS — C50912 Malignant neoplasm of unspecified site of left female breast: Secondary | ICD-10-CM | POA: Diagnosis not present

## 2017-01-05 DIAGNOSIS — J9610 Chronic respiratory failure, unspecified whether with hypoxia or hypercapnia: Secondary | ICD-10-CM | POA: Diagnosis not present

## 2017-01-05 DIAGNOSIS — J189 Pneumonia, unspecified organism: Secondary | ICD-10-CM | POA: Diagnosis not present

## 2017-01-05 DIAGNOSIS — C50512 Malignant neoplasm of lower-outer quadrant of left female breast: Secondary | ICD-10-CM | POA: Diagnosis not present

## 2017-01-05 DIAGNOSIS — J440 Chronic obstructive pulmonary disease with acute lower respiratory infection: Secondary | ICD-10-CM | POA: Diagnosis not present

## 2017-01-05 DIAGNOSIS — I110 Hypertensive heart disease with heart failure: Secondary | ICD-10-CM | POA: Diagnosis not present

## 2017-01-05 DIAGNOSIS — Z170 Estrogen receptor positive status [ER+]: Secondary | ICD-10-CM | POA: Diagnosis not present

## 2017-01-09 DIAGNOSIS — J441 Chronic obstructive pulmonary disease with (acute) exacerbation: Secondary | ICD-10-CM | POA: Diagnosis not present

## 2017-01-09 DIAGNOSIS — J189 Pneumonia, unspecified organism: Secondary | ICD-10-CM | POA: Diagnosis not present

## 2017-01-09 DIAGNOSIS — J9610 Chronic respiratory failure, unspecified whether with hypoxia or hypercapnia: Secondary | ICD-10-CM | POA: Diagnosis not present

## 2017-01-09 DIAGNOSIS — I110 Hypertensive heart disease with heart failure: Secondary | ICD-10-CM | POA: Diagnosis not present

## 2017-01-09 DIAGNOSIS — J440 Chronic obstructive pulmonary disease with acute lower respiratory infection: Secondary | ICD-10-CM | POA: Diagnosis not present

## 2017-01-09 DIAGNOSIS — C50919 Malignant neoplasm of unspecified site of unspecified female breast: Secondary | ICD-10-CM | POA: Diagnosis not present

## 2017-01-10 DIAGNOSIS — L97812 Non-pressure chronic ulcer of other part of right lower leg with fat layer exposed: Secondary | ICD-10-CM | POA: Diagnosis not present

## 2017-01-10 DIAGNOSIS — I87313 Chronic venous hypertension (idiopathic) with ulcer of bilateral lower extremity: Secondary | ICD-10-CM | POA: Diagnosis not present

## 2017-01-10 DIAGNOSIS — L97822 Non-pressure chronic ulcer of other part of left lower leg with fat layer exposed: Secondary | ICD-10-CM | POA: Diagnosis not present

## 2017-01-11 DIAGNOSIS — C50919 Malignant neoplasm of unspecified site of unspecified female breast: Secondary | ICD-10-CM | POA: Diagnosis not present

## 2017-01-11 DIAGNOSIS — J189 Pneumonia, unspecified organism: Secondary | ICD-10-CM | POA: Diagnosis not present

## 2017-01-11 DIAGNOSIS — J9610 Chronic respiratory failure, unspecified whether with hypoxia or hypercapnia: Secondary | ICD-10-CM | POA: Diagnosis not present

## 2017-01-11 DIAGNOSIS — I110 Hypertensive heart disease with heart failure: Secondary | ICD-10-CM | POA: Diagnosis not present

## 2017-01-11 DIAGNOSIS — J440 Chronic obstructive pulmonary disease with acute lower respiratory infection: Secondary | ICD-10-CM | POA: Diagnosis not present

## 2017-01-11 DIAGNOSIS — J441 Chronic obstructive pulmonary disease with (acute) exacerbation: Secondary | ICD-10-CM | POA: Diagnosis not present

## 2017-01-12 DIAGNOSIS — E7800 Pure hypercholesterolemia, unspecified: Secondary | ICD-10-CM | POA: Diagnosis not present

## 2017-01-12 DIAGNOSIS — Z23 Encounter for immunization: Secondary | ICD-10-CM | POA: Diagnosis not present

## 2017-01-12 DIAGNOSIS — J189 Pneumonia, unspecified organism: Secondary | ICD-10-CM | POA: Diagnosis not present

## 2017-01-12 DIAGNOSIS — E1165 Type 2 diabetes mellitus with hyperglycemia: Secondary | ICD-10-CM | POA: Diagnosis not present

## 2017-01-12 DIAGNOSIS — J449 Chronic obstructive pulmonary disease, unspecified: Secondary | ICD-10-CM | POA: Diagnosis not present

## 2017-01-12 DIAGNOSIS — B3789 Other sites of candidiasis: Secondary | ICD-10-CM | POA: Diagnosis not present

## 2017-01-15 DIAGNOSIS — J9610 Chronic respiratory failure, unspecified whether with hypoxia or hypercapnia: Secondary | ICD-10-CM | POA: Diagnosis not present

## 2017-01-15 DIAGNOSIS — J440 Chronic obstructive pulmonary disease with acute lower respiratory infection: Secondary | ICD-10-CM | POA: Diagnosis not present

## 2017-01-15 DIAGNOSIS — J441 Chronic obstructive pulmonary disease with (acute) exacerbation: Secondary | ICD-10-CM | POA: Diagnosis not present

## 2017-01-15 DIAGNOSIS — J189 Pneumonia, unspecified organism: Secondary | ICD-10-CM | POA: Diagnosis not present

## 2017-01-16 DIAGNOSIS — J189 Pneumonia, unspecified organism: Secondary | ICD-10-CM | POA: Diagnosis not present

## 2017-01-16 DIAGNOSIS — I110 Hypertensive heart disease with heart failure: Secondary | ICD-10-CM | POA: Diagnosis not present

## 2017-01-16 DIAGNOSIS — E1165 Type 2 diabetes mellitus with hyperglycemia: Secondary | ICD-10-CM | POA: Diagnosis not present

## 2017-01-16 DIAGNOSIS — E7800 Pure hypercholesterolemia, unspecified: Secondary | ICD-10-CM | POA: Diagnosis not present

## 2017-01-16 DIAGNOSIS — J440 Chronic obstructive pulmonary disease with acute lower respiratory infection: Secondary | ICD-10-CM | POA: Diagnosis not present

## 2017-01-16 DIAGNOSIS — J441 Chronic obstructive pulmonary disease with (acute) exacerbation: Secondary | ICD-10-CM | POA: Diagnosis not present

## 2017-01-16 DIAGNOSIS — J9610 Chronic respiratory failure, unspecified whether with hypoxia or hypercapnia: Secondary | ICD-10-CM | POA: Diagnosis not present

## 2017-01-16 DIAGNOSIS — C50919 Malignant neoplasm of unspecified site of unspecified female breast: Secondary | ICD-10-CM | POA: Diagnosis not present

## 2017-01-17 DIAGNOSIS — Z872 Personal history of diseases of the skin and subcutaneous tissue: Secondary | ICD-10-CM | POA: Diagnosis not present

## 2017-01-17 DIAGNOSIS — I87313 Chronic venous hypertension (idiopathic) with ulcer of bilateral lower extremity: Secondary | ICD-10-CM | POA: Diagnosis not present

## 2017-01-17 DIAGNOSIS — I890 Lymphedema, not elsewhere classified: Secondary | ICD-10-CM | POA: Diagnosis not present

## 2017-01-17 DIAGNOSIS — L97829 Non-pressure chronic ulcer of other part of left lower leg with unspecified severity: Secondary | ICD-10-CM | POA: Diagnosis not present

## 2017-01-17 DIAGNOSIS — L97819 Non-pressure chronic ulcer of other part of right lower leg with unspecified severity: Secondary | ICD-10-CM | POA: Diagnosis not present

## 2017-01-17 DIAGNOSIS — Z09 Encounter for follow-up examination after completed treatment for conditions other than malignant neoplasm: Secondary | ICD-10-CM | POA: Diagnosis not present

## 2017-01-18 DIAGNOSIS — I110 Hypertensive heart disease with heart failure: Secondary | ICD-10-CM | POA: Diagnosis not present

## 2017-01-18 DIAGNOSIS — J9610 Chronic respiratory failure, unspecified whether with hypoxia or hypercapnia: Secondary | ICD-10-CM | POA: Diagnosis not present

## 2017-01-18 DIAGNOSIS — J189 Pneumonia, unspecified organism: Secondary | ICD-10-CM | POA: Diagnosis not present

## 2017-01-18 DIAGNOSIS — J440 Chronic obstructive pulmonary disease with acute lower respiratory infection: Secondary | ICD-10-CM | POA: Diagnosis not present

## 2017-01-18 DIAGNOSIS — C50919 Malignant neoplasm of unspecified site of unspecified female breast: Secondary | ICD-10-CM | POA: Diagnosis not present

## 2017-01-18 DIAGNOSIS — J441 Chronic obstructive pulmonary disease with (acute) exacerbation: Secondary | ICD-10-CM | POA: Diagnosis not present

## 2017-01-23 DIAGNOSIS — J189 Pneumonia, unspecified organism: Secondary | ICD-10-CM | POA: Diagnosis not present

## 2017-01-23 DIAGNOSIS — I110 Hypertensive heart disease with heart failure: Secondary | ICD-10-CM | POA: Diagnosis not present

## 2017-01-23 DIAGNOSIS — J9610 Chronic respiratory failure, unspecified whether with hypoxia or hypercapnia: Secondary | ICD-10-CM | POA: Diagnosis not present

## 2017-01-23 DIAGNOSIS — J440 Chronic obstructive pulmonary disease with acute lower respiratory infection: Secondary | ICD-10-CM | POA: Diagnosis not present

## 2017-01-23 DIAGNOSIS — J441 Chronic obstructive pulmonary disease with (acute) exacerbation: Secondary | ICD-10-CM | POA: Diagnosis not present

## 2017-01-23 DIAGNOSIS — C50919 Malignant neoplasm of unspecified site of unspecified female breast: Secondary | ICD-10-CM | POA: Diagnosis not present

## 2017-01-25 DIAGNOSIS — I110 Hypertensive heart disease with heart failure: Secondary | ICD-10-CM | POA: Diagnosis not present

## 2017-01-25 DIAGNOSIS — J189 Pneumonia, unspecified organism: Secondary | ICD-10-CM | POA: Diagnosis not present

## 2017-01-25 DIAGNOSIS — C50919 Malignant neoplasm of unspecified site of unspecified female breast: Secondary | ICD-10-CM | POA: Diagnosis not present

## 2017-01-25 DIAGNOSIS — J441 Chronic obstructive pulmonary disease with (acute) exacerbation: Secondary | ICD-10-CM | POA: Diagnosis not present

## 2017-01-25 DIAGNOSIS — J440 Chronic obstructive pulmonary disease with acute lower respiratory infection: Secondary | ICD-10-CM | POA: Diagnosis not present

## 2017-01-25 DIAGNOSIS — J9610 Chronic respiratory failure, unspecified whether with hypoxia or hypercapnia: Secondary | ICD-10-CM | POA: Diagnosis not present

## 2017-01-29 DIAGNOSIS — J440 Chronic obstructive pulmonary disease with acute lower respiratory infection: Secondary | ICD-10-CM | POA: Diagnosis not present

## 2017-01-29 DIAGNOSIS — J9610 Chronic respiratory failure, unspecified whether with hypoxia or hypercapnia: Secondary | ICD-10-CM | POA: Diagnosis not present

## 2017-01-29 DIAGNOSIS — I110 Hypertensive heart disease with heart failure: Secondary | ICD-10-CM | POA: Diagnosis not present

## 2017-01-29 DIAGNOSIS — J189 Pneumonia, unspecified organism: Secondary | ICD-10-CM | POA: Diagnosis not present

## 2017-01-29 DIAGNOSIS — J441 Chronic obstructive pulmonary disease with (acute) exacerbation: Secondary | ICD-10-CM | POA: Diagnosis not present

## 2017-01-29 DIAGNOSIS — C50919 Malignant neoplasm of unspecified site of unspecified female breast: Secondary | ICD-10-CM | POA: Diagnosis not present

## 2017-02-05 DIAGNOSIS — J441 Chronic obstructive pulmonary disease with (acute) exacerbation: Secondary | ICD-10-CM | POA: Diagnosis not present

## 2017-02-05 DIAGNOSIS — C50919 Malignant neoplasm of unspecified site of unspecified female breast: Secondary | ICD-10-CM | POA: Diagnosis not present

## 2017-02-05 DIAGNOSIS — I110 Hypertensive heart disease with heart failure: Secondary | ICD-10-CM | POA: Diagnosis not present

## 2017-02-05 DIAGNOSIS — J9610 Chronic respiratory failure, unspecified whether with hypoxia or hypercapnia: Secondary | ICD-10-CM | POA: Diagnosis not present

## 2017-02-05 DIAGNOSIS — J440 Chronic obstructive pulmonary disease with acute lower respiratory infection: Secondary | ICD-10-CM | POA: Diagnosis not present

## 2017-02-05 DIAGNOSIS — J189 Pneumonia, unspecified organism: Secondary | ICD-10-CM | POA: Diagnosis not present

## 2017-02-19 DIAGNOSIS — E7800 Pure hypercholesterolemia, unspecified: Secondary | ICD-10-CM | POA: Diagnosis not present

## 2017-02-19 DIAGNOSIS — J449 Chronic obstructive pulmonary disease, unspecified: Secondary | ICD-10-CM | POA: Diagnosis not present

## 2017-02-19 DIAGNOSIS — Z1211 Encounter for screening for malignant neoplasm of colon: Secondary | ICD-10-CM | POA: Diagnosis not present

## 2017-02-19 DIAGNOSIS — E1165 Type 2 diabetes mellitus with hyperglycemia: Secondary | ICD-10-CM | POA: Diagnosis not present

## 2017-02-26 DIAGNOSIS — L97822 Non-pressure chronic ulcer of other part of left lower leg with fat layer exposed: Secondary | ICD-10-CM | POA: Diagnosis not present

## 2017-02-26 DIAGNOSIS — I87312 Chronic venous hypertension (idiopathic) with ulcer of left lower extremity: Secondary | ICD-10-CM | POA: Diagnosis not present

## 2017-02-26 DIAGNOSIS — I890 Lymphedema, not elsewhere classified: Secondary | ICD-10-CM | POA: Diagnosis not present

## 2017-02-26 DIAGNOSIS — I872 Venous insufficiency (chronic) (peripheral): Secondary | ICD-10-CM | POA: Diagnosis not present

## 2017-03-02 DIAGNOSIS — C50512 Malignant neoplasm of lower-outer quadrant of left female breast: Secondary | ICD-10-CM | POA: Diagnosis not present

## 2017-03-02 DIAGNOSIS — C50919 Malignant neoplasm of unspecified site of unspecified female breast: Secondary | ICD-10-CM | POA: Diagnosis not present

## 2017-03-02 DIAGNOSIS — T451X1D Poisoning by antineoplastic and immunosuppressive drugs, accidental (unintentional), subsequent encounter: Secondary | ICD-10-CM | POA: Diagnosis not present

## 2017-03-02 DIAGNOSIS — I517 Cardiomegaly: Secondary | ICD-10-CM | POA: Diagnosis not present

## 2017-03-02 DIAGNOSIS — I272 Pulmonary hypertension, unspecified: Secondary | ICD-10-CM | POA: Diagnosis not present

## 2017-03-02 DIAGNOSIS — I081 Rheumatic disorders of both mitral and tricuspid valves: Secondary | ICD-10-CM | POA: Diagnosis not present

## 2017-03-07 DIAGNOSIS — L97829 Non-pressure chronic ulcer of other part of left lower leg with unspecified severity: Secondary | ICD-10-CM | POA: Diagnosis not present

## 2017-03-07 DIAGNOSIS — I890 Lymphedema, not elsewhere classified: Secondary | ICD-10-CM | POA: Diagnosis not present

## 2017-03-07 DIAGNOSIS — I87303 Chronic venous hypertension (idiopathic) without complications of bilateral lower extremity: Secondary | ICD-10-CM | POA: Diagnosis not present

## 2017-03-07 DIAGNOSIS — I87312 Chronic venous hypertension (idiopathic) with ulcer of left lower extremity: Secondary | ICD-10-CM | POA: Diagnosis not present

## 2017-03-07 DIAGNOSIS — Z872 Personal history of diseases of the skin and subcutaneous tissue: Secondary | ICD-10-CM | POA: Diagnosis not present

## 2017-03-07 DIAGNOSIS — Z09 Encounter for follow-up examination after completed treatment for conditions other than malignant neoplasm: Secondary | ICD-10-CM | POA: Diagnosis not present

## 2017-03-10 DIAGNOSIS — J9690 Respiratory failure, unspecified, unspecified whether with hypoxia or hypercapnia: Secondary | ICD-10-CM | POA: Diagnosis not present

## 2017-03-12 DIAGNOSIS — J449 Chronic obstructive pulmonary disease, unspecified: Secondary | ICD-10-CM | POA: Diagnosis not present

## 2017-03-12 DIAGNOSIS — D649 Anemia, unspecified: Secondary | ICD-10-CM | POA: Diagnosis not present

## 2017-03-12 DIAGNOSIS — C50512 Malignant neoplasm of lower-outer quadrant of left female breast: Secondary | ICD-10-CM | POA: Diagnosis not present

## 2017-03-12 DIAGNOSIS — Z9981 Dependence on supplemental oxygen: Secondary | ICD-10-CM | POA: Diagnosis not present

## 2017-03-13 DIAGNOSIS — L97812 Non-pressure chronic ulcer of other part of right lower leg with fat layer exposed: Secondary | ICD-10-CM | POA: Diagnosis not present

## 2017-03-13 DIAGNOSIS — L97822 Non-pressure chronic ulcer of other part of left lower leg with fat layer exposed: Secondary | ICD-10-CM | POA: Diagnosis not present

## 2017-03-13 DIAGNOSIS — I87313 Chronic venous hypertension (idiopathic) with ulcer of bilateral lower extremity: Secondary | ICD-10-CM | POA: Diagnosis not present

## 2017-03-13 DIAGNOSIS — I890 Lymphedema, not elsewhere classified: Secondary | ICD-10-CM | POA: Diagnosis not present

## 2017-03-23 DIAGNOSIS — L97812 Non-pressure chronic ulcer of other part of right lower leg with fat layer exposed: Secondary | ICD-10-CM | POA: Diagnosis not present

## 2017-03-23 DIAGNOSIS — L97822 Non-pressure chronic ulcer of other part of left lower leg with fat layer exposed: Secondary | ICD-10-CM | POA: Diagnosis not present

## 2017-03-23 DIAGNOSIS — I87313 Chronic venous hypertension (idiopathic) with ulcer of bilateral lower extremity: Secondary | ICD-10-CM | POA: Diagnosis not present

## 2017-03-23 DIAGNOSIS — I890 Lymphedema, not elsewhere classified: Secondary | ICD-10-CM | POA: Diagnosis not present

## 2017-03-30 DIAGNOSIS — I87313 Chronic venous hypertension (idiopathic) with ulcer of bilateral lower extremity: Secondary | ICD-10-CM | POA: Diagnosis not present

## 2017-03-30 DIAGNOSIS — L97829 Non-pressure chronic ulcer of other part of left lower leg with unspecified severity: Secondary | ICD-10-CM | POA: Diagnosis not present

## 2017-03-30 DIAGNOSIS — I872 Venous insufficiency (chronic) (peripheral): Secondary | ICD-10-CM | POA: Diagnosis not present

## 2017-03-30 DIAGNOSIS — L97819 Non-pressure chronic ulcer of other part of right lower leg with unspecified severity: Secondary | ICD-10-CM | POA: Diagnosis not present

## 2017-03-30 DIAGNOSIS — I890 Lymphedema, not elsewhere classified: Secondary | ICD-10-CM | POA: Diagnosis not present

## 2017-04-05 ENCOUNTER — Ambulatory Visit (INDEPENDENT_AMBULATORY_CARE_PROVIDER_SITE_OTHER)
Admission: RE | Admit: 2017-04-05 | Discharge: 2017-04-05 | Disposition: A | Discharge status: Home / Self Care | Payer: Medicare Other | Source: Ambulatory Visit | Attending: Internal Medicine | Admitting: Internal Medicine

## 2017-04-05 ENCOUNTER — Encounter: Payer: Self-pay | Admitting: Internal Medicine

## 2017-04-05 ENCOUNTER — Ambulatory Visit (INDEPENDENT_AMBULATORY_CARE_PROVIDER_SITE_OTHER): Payer: Medicare Other | Admitting: Internal Medicine

## 2017-04-05 ENCOUNTER — Other Ambulatory Visit (INDEPENDENT_AMBULATORY_CARE_PROVIDER_SITE_OTHER): Payer: Medicare Other

## 2017-04-05 VITALS — BP 132/74 | HR 77 | Ht 61.5 in | Wt 287.0 lb

## 2017-04-05 DIAGNOSIS — J45991 Cough variant asthma: Secondary | ICD-10-CM | POA: Diagnosis not present

## 2017-04-05 DIAGNOSIS — I10 Essential (primary) hypertension: Secondary | ICD-10-CM | POA: Diagnosis not present

## 2017-04-05 DIAGNOSIS — R0609 Other forms of dyspnea: Secondary | ICD-10-CM

## 2017-04-05 DIAGNOSIS — J9612 Chronic respiratory failure with hypercapnia: Secondary | ICD-10-CM | POA: Diagnosis not present

## 2017-04-05 DIAGNOSIS — J9611 Chronic respiratory failure with hypoxia: Secondary | ICD-10-CM | POA: Diagnosis not present

## 2017-04-05 DIAGNOSIS — R0602 Shortness of breath: Secondary | ICD-10-CM | POA: Diagnosis not present

## 2017-04-05 DIAGNOSIS — I2781 Cor pulmonale (chronic): Secondary | ICD-10-CM

## 2017-04-05 LAB — CBC WITH DIFFERENTIAL/PLATELET
BASOS ABS: 0.1 10*3/uL (ref 0.0–0.1)
Basophils Relative: 1.4 % (ref 0.0–3.0)
Eosinophils Absolute: 0.2 10*3/uL (ref 0.0–0.7)
Eosinophils Relative: 4.7 % (ref 0.0–5.0)
HEMATOCRIT: 38.3 % (ref 36.0–46.0)
Hemoglobin: 12.1 g/dL (ref 12.0–15.0)
LYMPHS PCT: 17 % (ref 12.0–46.0)
Lymphs Abs: 0.8 10*3/uL (ref 0.7–4.0)
MCHC: 31.6 g/dL (ref 30.0–36.0)
MCV: 87 fl (ref 78.0–100.0)
MONOS PCT: 9.8 % (ref 3.0–12.0)
Monocytes Absolute: 0.5 10*3/uL (ref 0.1–1.0)
NEUTROS ABS: 3.1 10*3/uL (ref 1.4–7.7)
Neutrophils Relative %: 67.1 % (ref 43.0–77.0)
PLATELETS: 200 10*3/uL (ref 150.0–400.0)
RBC: 4.41 Mil/uL (ref 3.87–5.11)
RDW: 14.4 % (ref 11.5–15.5)
WBC: 4.7 10*3/uL (ref 4.0–10.5)

## 2017-04-05 LAB — SEDIMENTATION RATE: SED RATE: 47 mm/h — AB (ref 0–30)

## 2017-04-05 MED ORDER — LOSARTAN POTASSIUM 100 MG PO TABS: 100.0000 mg | ORAL_TABLET | Freq: Every day | ORAL | 11 refills | Status: DC

## 2017-04-05 MED ORDER — BISOPROLOL FUMARATE 5 MG PO TABS: 5.0000 mg | ORAL_TABLET | Freq: Every day | ORAL | 2 refills | Status: DC

## 2017-04-05 NOTE — Progress Notes (Signed)
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 acei (she's not sure)   with baseline wt around 260 started on 02 at hs with dx of copd/ rx with symbicort seemed better but a lot worse since early 2018 > hosp at [place] with dx of "flu" or "double pna" summer 2018  and referred to pulmonary clinic 04/05/2017 by Dr   Burnett Sheng with chronic resp failure/ [place] on Echo 10 03/02/17     04/05/2017  f/u ov/Daren Yeagle re:  Chief Complaint  Patient presents with  . Pulmonary Consult    Referred by Dickey Gave, PA for eval of pulmonary hypertension. Pt states she has had SOB and has been on o2 "for years".  She states her breathing has been worse for the past 3 months. She states she gets SOB just walking from room to room at home.    30 degrees sleeping in recliner x years and doe x across the room so mostly w/c bound as well at this point on 2lpm 24/7 and denies ever being eval for osa but carries dx of copd in [facility] records  On chemo for breast ca summer of 8280 complicated by svt > rx amiodarone started and xarelto resulted in nose bleeds so she stopped this ? ? How long.   Has chronic daytime dry cough x years - this plus progressive sob no better on inhalers and no obvious day to day or daytime variability or assoc excess/ purulent sputum or mucus plugs or hemoptysis or cp or chest tightness, subjective wheeze or overt sinus or hb symptoms. No unusual exposure hx or h/o childhood pna/ asthma or knowledge of premature birth.  Sleeping ok in recliner at 30 degrees/ 02 2lpm  without nocturnal  or early am exacerbation  of respiratory  c/o's or need for noct saba. Also denies any obvious fluctuation of symptoms with weather or environmental changes or other aggravating or alleviating factors except as outlined above   Current Allergies, Complete  Past Medical History, Past Surgical History, Family History, and Social History were reviewed in [employer] record.  ROS  The following are not active complaints unless bolded Hoarseness, sore throat, dysphagia, dental problems, itching, sneezing,  nasal congestion or discharge of excess mucus or purulent secretions, ear ache,   fever, chills, sweats, unintended wt loss or wt gain, classically pleuritic or exertional cp,  orthopnea pnd or leg swelling, presyncope, palpitations, abdominal pain, anorexia, nausea, vomiting, diarrhea  or change in bowel habits or change in bladder habits, change in stools or change in urine, dysuria, hematuria,  rash, arthralgias, visual complaints, headache, numbness, weakness or ataxia or problems with walking or coordination,  change in mood/affect or memory.        Current Meds  Medication Sig  . acetaminophen (TYLENOL) 325 MG tablet Take 650 mg by mouth every 6 (six) hours as needed.  [name] Kitchen albuterol (PROVENTIL HFA;VENTOLIN HFA) 108 (90 Base) MCG/ACT inhaler Inhale 1 puff into the lungs every 6 (six) hours as needed for wheezing or shortness of breath.  [name] Kitchen amiodarone (PACERONE) 200 MG tablet Take 200 mg by mouth daily.  [name] Kitchen anastrozole (ARIMIDEX) 1 MG tablet Take 1 mg by mouth daily.  [name] Kitchen aspirin EC 81 MG tablet Take 81 mg by mouth daily.  [name] Kitchen atorvastatin (LIPITOR) 20 MG tablet  Take 20 mg by mouth daily.  . budesonide-formoterol (SYMBICORT) 160-4.5 MCG/ACT inhaler Inhale 2 puffs into the lungs 2 (two) times daily.  . carvedilol (COREG) 12.5 MG tablet Take 12.5 mg by mouth 2 (two) times daily with a meal.  . furosemide (LASIX) 20 MG tablet 2 am and 1 pm  . insulin glargine (LANTUS) 100 UNIT/ML injection Inject 50 Units into the skin at bedtime.  . insulin lispro (HUMALOG) 100 UNIT/ML injection Inject 10 Units into the skin daily.  [name] Kitchen ipratropium-albuterol (DUONEB) 0.5-2.5 (3) MG/3ML SOLN Take 3 mLs by nebulization every 4 (four) hours as needed.  [name] Kitchen  lisinopril (PRINIVIL,ZESTRIL) 10 MG tablet Take 10 mg by mouth daily.  . meclizine (ANTIVERT) 25 MG tablet Take 25 mg by mouth 3 (three) times daily as needed for dizziness.  . OXYGEN Oxygen 2lpm 24/7  AHC  . saxagliptin HCl (ONGLYZA) 2.5 MG TABS tablet Take 2.5 mg by mouth daily.  [name] Kitchen tiotropium (SPIRIVA) 18 MCG inhalation capsule Place 18 mcg into inhaler and inhale daily.        Review of Systems     Objective:   Physical Exam  W/c bound obese elderly wf mild voice fatigue  Wt Readings from Last 3 Encounters:  04/05/17 287 lb (130.2 kg)    Vital signs reviewed - Note on arrival 02 sats  97% on 2lpm    HEENT: nl   turbinates bilaterally, and oropharynx. Nl external ear canals without cough reflex - very poor dentition    NECK :  without JVD/Nodes/TM/ nl carotid upstrokes bilaterally   LUNGS: no acc muscle use,  Nl contour chest which is clear to A and P bilaterally without cough on insp or exp maneuvers   CV:  IRIR   no s3 or murmur or increase in P2, and legs wrapped tightly bilaterally so no examined for edema  ABD:  Massively obese/ soft and nontender with nl inspiratory excursion in the supine position. No bruits or organomegaly appreciated, bowel sounds nl  MS:   ext warm without deformities, calf tenderness, cyanosis or clubbing No obvious joint restrictions   SKIN: warm and dry without lesions  - legs wrapped to knee level so not examined   NEURO:  alert, approp, nl sensorium with  no motor or cerebellar deficits apparent.      CXR PA and Lateral:   04/05/2017 :    I personally reviewed images and  impression as follows:    small lung vol with plate like compressive atx R > L esp RML area   Labs ordered/ reviewed:      Chemistry      Component Value Date/Time   NA 140 04/05/2017 1710   K 4.2 04/05/2017 1710   CL 99 04/05/2017 1710   CO2 35 (H) 04/05/2017 1710   BUN 32 (H) 04/05/2017 1710   CREATININE 1.18 04/05/2017 1710      Component Value  Date/Time   CALCIUM 9.4 04/05/2017 1710        Lab Results  Component Value Date   WBC 4.7 04/05/2017   HGB 12.1 04/05/2017   HCT 38.3 04/05/2017   MCV 87.0 04/05/2017   PLT 200.0 04/05/2017        Lab Results  Component Value Date   TSH 1.93 04/05/2017     Lab Results  Component Value Date   PROBNP 109.0 (H) 04/05/2017       Lab Results  Component Value Date   ESRSEDRATE 47 (H) 04/05/2017  Assessment:

## 2017-04-05 NOTE — Patient Instructions (Addendum)
Plan A = Automatic = stop spiriva and continue Symbicort 160 Take 2 puffs first thing in am and then another 2 puffs about 12 hours later.    Work on inhaler technique:  relax and gently blow all the way out then take a nice smooth deep breath back in, triggering the inhaler at same time you start breathing in.  Hold for up to 5 seconds if you can. Blow out thru nose. Rinse and gargle with water when done      Plan B = Backup Only use your albuterol (Ventolin) as a rescue medication to be used if you can't catch your breath by resting or doing a relaxed purse lip breathing pattern.  - The less you use it, the better it will work when you need it. - Ok to use the inhaler up to 2 puffs  every 4 hours if you must but call for appointment if use goes up over your usual need - Don't leave home without it !!  (think of it like the spare tire for your car)   Plan C = Crisis - only use your albuterol nebulizer if you first try Plan B and it fails to help > ok to use the nebulizer up to every 4 hours but if start needing it regularly call for immediate appointment  Stop lisinopril and start losartan 100 mg daily  And bisoprolol 5 mg daily   See Tammy NP w/in 2 weeks with all your medications, even over the counter meds, separated in two separate bags, the ones you take no matter what vs the ones you stop once you feel better and take only as needed when you feel you need them.   Tammy  will generate for you a new user friendly medication calendar that will put [place] all on the same page re: your medication use.     Without this process, it simply isn't possible to assure that we are providing  your outpatient care  with  the attention to detail we feel you deserve.   If we cannot assure that you're getting that kind of care,  then we cannot manage your problem effectively from this clinic.  Once you have seen Tammy and we are sure that we're all on the same page with your medication use she will arrange  follow up with me.    Please remember to go to the lab and x-ray department downstairs in the basement  for your tests - we will call you with the results when they are available. - add:  ono on 2lpm added

## 2017-04-06 ENCOUNTER — Encounter: Payer: Self-pay | Admitting: Internal Medicine

## 2017-04-06 ENCOUNTER — Other Ambulatory Visit: Payer: Self-pay | Admitting: Internal Medicine

## 2017-04-06 DIAGNOSIS — J45909 Unspecified asthma, uncomplicated: Secondary | ICD-10-CM | POA: Insufficient documentation

## 2017-04-06 DIAGNOSIS — J9611 Chronic respiratory failure with hypoxia: Secondary | ICD-10-CM | POA: Insufficient documentation

## 2017-04-06 DIAGNOSIS — I2781 Cor pulmonale (chronic): Secondary | ICD-10-CM | POA: Insufficient documentation

## 2017-04-06 DIAGNOSIS — J9612 Chronic respiratory failure with hypercapnia: Secondary | ICD-10-CM

## 2017-04-06 DIAGNOSIS — R0609 Other forms of dyspnea: Principal | ICD-10-CM

## 2017-04-06 LAB — BASIC METABOLIC PANEL
BUN: 32 mg/dL — AB (ref 6–23)
CHLORIDE: 99 meq/L (ref 96–112)
CO2: 35 meq/L — AB (ref 19–32)
Calcium: 9.4 mg/dL (ref 8.4–10.5)
Creatinine, Ser: 1.18 mg/dL (ref 0.40–1.20)
GFR: 48.41 mL/min — ABNORMAL LOW (ref >60.00)
GLUCOSE: 87 mg/dL (ref 70–99)
POTASSIUM: 4.2 meq/L (ref 3.5–5.1)
Sodium: 140 mEq/L (ref 135–145)

## 2017-04-06 LAB — BRAIN NATRIURETIC PEPTIDE: Pro B Natriuretic peptide (BNP): 109 pg/mL — ABNORMAL HIGH (ref 0.0–100.0)

## 2017-04-06 LAB — TSH: TSH: 1.93 u[IU]/mL (ref 0.35–4.50)

## 2017-04-06 NOTE — Assessment & Plan Note (Signed)
Try off acei 04/05/2017 due to pseudocopd and off corgard due to suspected asthma   In the best review of chronic cough to date ( NEJM 2016 375 [id number]) ,  ACEi are now felt to cause cough in up to  20% of pts which is a 4 fold increase from previous reports and does not include the variety of non-specific complaints we see in pulmonary clinic in pts on ACEi but previously attributed to another dx like  Copd/asthma and  include PNDS, throat and chest congestion, "bronchitis", unexplained dyspnea and noct "strangling" sensations, and hoarseness, but also  atypical /refractory GERD symptoms like dysphagia and "bad heartburn"   The only way I know  to prove this is not an "ACEi Case" is a trial off ACEi x a minimum of 6 weeks then regroup.   Try losartan 100 mg x 6 weeks   In the setting of respiratory symptoms of unknown etiology,  It would be preferable to use bystolic, the most beta -1  selective Beta blocker available in sample form, with bisoprolol the most selective generic choice  on the market, at least on a trial basis, to make sure the spillover Beta 2 effects of the less specific Beta blockers are not contributing to this patient's symptoms.  Try bisoprolol 27m daily and recheck in 2 weeks

## 2017-04-06 NOTE — Progress Notes (Signed)
Spoke with pt and notified of results per Dr. Wert. Pt verbalized understanding and denied any questions. 

## 2017-04-06 NOTE — Assessment & Plan Note (Signed)
See Echo 03/02/17 with [place] in setting of moderate diastolic dysfunction with mod LAE so would need RHC to document   In meantime need to be sure we optimize lung function and assure 24h adequate 02   Start with ONO on 2lpm

## 2017-04-06 NOTE — Assessment & Plan Note (Addendum)
Body mass index is 53.35 kg/m.  -   Lab Results  Component Value Date   TSH 1.93 04/05/2017     Contributing to gerd risk/ doe/reviewed the need and the process to achieve and maintain neg calorie balance > defer f/u primary care including intermittently monitoring thyroid status

## 2017-04-06 NOTE — Assessment & Plan Note (Signed)
HCO3   04/05/2017  =    35   Though somewhat paradoxic, when the lung fails to clear C02 properly and pC02 rises the lung then becomes a more efficient scavenger of C02 allowing lower work of breathing and  better C02 clearance albeit at a higher serum pC02 level - this is why pts can look a lot better than their ABG's would suggest and why it's so difficult to prognosticate endstage dz.  It's also why I strongly rec DNI status (ventilating pts down to a nl pC02 adversely affects this compensatory mechanism)   In view of [place] may need to be more aggressive here and consider NIV but only as a last option and for now just check ono on 2lpm

## 2017-04-06 NOTE — Assessment & Plan Note (Signed)
04/05/2017  After extensive coaching HFA effectiveness =    75% from a baseline on < 25% - Spirometry 04/05/2017  FEV1 0.89 (42%)  Ratio 71 with mild curvature p am symb/spiriva   She clearly does not have copd and at this point not sure how much asthma she has either but will sort this out over the next few weeks  In meantime - Try off acei  and off corgard  And regroup in 2 weeks   see avs for instructions unique to this ov re ABC contingency plans/ reviewed

## 2017-04-06 NOTE — Assessment & Plan Note (Addendum)
Symptoms are markedly disproportionate to objective findings and not clear this is actually much of a  lung problem but pt does appear to have difficult to sort out respiratory symptoms of unknown origin for which  DDX  = almost all start with A and  include Adherence, Ace Inhibitors, Acid Reflux, Active Sinus Disease, Alpha 1 Antitripsin deficiency, Anxiety masquerading as Airways dz,  ABPA,  Allergy(esp in young), Aspiration (esp in elderly), Adverse effects of meds,  Active smokers, Anemia/ thyroid dz,  A bunch of PE's (a small clot burden can't cause this syndrome unless there is already severe underlying pulm or vascular dz with poor reserve) plus two Bs  = Bronchiectasis and Beta blocker use..and one C= CHF    Adherence is always the initial "prime suspect" and is a multilayered concern that requires a "trust but verify" approach in every patient - starting with knowing how to use medications, especially inhalers, correctly, keeping up with refills and understanding the fundamental difference between maintenance and prns vs those medications only taken for a very short course and then stopped and not refilled.  - see hfa teaching  - return in 2 weeks with all meds in hand using a trust but verify approach to confirm accurate Medication  Reconciliation The principal here is that until we are certain that the  patients are doing what we've asked, it makes no sense to ask them to do more.   ? ACEi effects > top of the usual list of suspects for "pseudoCOPD" > only way to know is try off (see separate a/p)   ? Asthma > continue symbicort 160 / no role for spiriva  ? Adverse effects of meds > note on amiodarone but no evidence of ILD  ? A bunch of PE's > high risk but nothing to suggest for now  Anemia/ thyroid dz > ruled out this ov   ? BB effects on mod doses corgard >  See hbp  ? CHF > has mod diastolic dysfunction and prob element of cor pulmale (see separate a/p)    Total time devoted to  counseling  > 50 % of initial 60 min office visit:  review case with pt/ discussion of options/alternatives/ personally creating written customized instructions  in presence of pt  then going over those specific  Instructions directly with the pt including how to use all of the meds but in particular covering each new medication in detail and the difference between the maintenance= "automatic" meds and the prns using an action plan format for the latter (If this problem/symptom => do that organization reading Left to right).  Please see AVS from this visit for a full list of these instructions which I personally wrote for this pt and  are unique to this visit.

## 2017-04-09 DIAGNOSIS — J9690 Respiratory failure, unspecified, unspecified whether with hypoxia or hypercapnia: Secondary | ICD-10-CM | POA: Diagnosis not present

## 2017-04-19 ENCOUNTER — Ambulatory Visit: Payer: Medicare Other | Admitting: Adult Health

## 2017-04-19 ENCOUNTER — Encounter: Payer: Self-pay | Admitting: Adult Health

## 2017-04-19 VITALS — BP 122/64 | HR 77 | Ht 62.0 in | Wt 284.8 lb

## 2017-04-19 DIAGNOSIS — J9612 Chronic respiratory failure with hypercapnia: Secondary | ICD-10-CM

## 2017-04-19 DIAGNOSIS — J45991 Cough variant asthma: Secondary | ICD-10-CM

## 2017-04-19 DIAGNOSIS — J9611 Chronic respiratory failure with hypoxia: Secondary | ICD-10-CM

## 2017-04-19 DIAGNOSIS — I2781 Cor pulmonale (chronic): Secondary | ICD-10-CM

## 2017-04-19 DIAGNOSIS — R0683 Snoring: Secondary | ICD-10-CM

## 2017-04-19 NOTE — Assessment & Plan Note (Addendum)
Appears to be improved control off of ACE inhibitor.  She is to continue on Symbicort twice daily. .Patient's medications were reviewed today and patient education was given. Computerized medication calendar was adjusted/completed

## 2017-04-19 NOTE — Assessment & Plan Note (Signed)
Suspect patient has some underlying OSA and OHS.  We will set patient up for a sleep study split night.  Patient is continue on oxygen to keep O2 saturations greater than 88-90%.

## 2017-04-19 NOTE — Progress Notes (Signed)
_0  ID: Tammy Mathews, female    DOB: 05/08/1951, 66 y.o.   MRN: [id number]  Chief Complaint  Patient presents with  . Follow-up    dyspnea     Referring provider: Greig Right, MD  HPI: 66 year old female never smoker seen for pulmonary consult April 05, 2017 for hypoxia and pulmonary hypertension with shortness of breath and chronic cough Breast cancer diagnosed 2018 treated with mastectomy and chemo . On Arimidex.  Previous Breast Cancer on right w/ XRT .  SVT diagnosed 2018 treated with amiodarone . Was unable to tolerate Xarelto due to epistaxis.   TEST  Spirometry 04/05/2017  FEV1 0.89 (42%)  Ratio 71 with mild curvature p am symb/spiriva  See Echo 03/02/17 with [facility] in setting of moderate diastolic dysfunction with mod LAE so would need RHC to document    04/19/2017 Follow up : O2 RF/Pulmonary HTN/Med Review  Patient returns for a 2-week follow-up.  She was seen last visit for a pulmonary consult for hypoxemia and pulmonary hypertension.  Patient says that she has been on oxygen for several years mainly at bedtime.  But recently has started needing oxygen throughout the day and night.  She is followed by cardiology in [facility].  She is suspected to have pulmonary hypertension.  Patient says she was told that she would have a sleep study but has never had this set up.  She denies any significant daytime sleepiness.  But does have some intermittent snoring.  She also has atrial fib.  Patient is a never smoker.  Says she has been told that she had COPD in the past.  She is on Symbicort.  Patient previously worked in the [employer].  Spirometry showed moderate to severe restriction with no airflow obstruction.  Patient has multiple medical problems including previous history of breast cancer hypertension diabetes and morbid obesity.  She has significant deconditioning and is very sedentary. Last visit patient was changed from lisinopril to losartan as she  was having cough and wheezing.  She says that this has improved significantly.  Her cough is almost totally resolved.  She has had decreased use of her albuterol.  She was also changed from her beta-blocker to bisoprolol.  Patient says she does feel that her breathing is somewhat improved.  She feels that her oxygen levels are not dropping as much.  And she is not as shortness of breath.  She denies any hemoptysis chest pain orthopnea.  She does have chronic leg swelling.  We reviewed all her medications.  Organize them into a medication calendar.  Patient education was given. She appears to be taking correctly.  Allergies  Allergen Reactions  . Codeine Nausea And Vomiting and Other (See Comments)    Unknown     Immunization History  Administered Date(s) Administered  . Influenza Split 03/06/2017  . Pneumococcal Conjugate-13 03/06/2017    History reviewed. No pertinent past medical history.  Tobacco History: Social History   Tobacco Use  Smoking Status Never Smoker  Smokeless Tobacco Never Used   Counseling given: Not Answered   Outpatient Encounter Medications as of 04/19/2017  Medication Sig  . acetaminophen (TYLENOL) 325 MG tablet Take 650 mg by mouth every 6 (six) hours as needed.  [name] Kitchen albuterol (PROVENTIL HFA;VENTOLIN HFA) 108 (90 Base) MCG/ACT inhaler Inhale 1 puff into the lungs every 6 (six) hours as needed for wheezing or shortness of breath.  [name] Kitchen amiodarone (PACERONE) 200 MG tablet Take 200 mg by mouth daily.  [name] Kitchen  anastrozole (ARIMIDEX) 1 MG tablet Take 1 mg by mouth daily.  [name] Kitchen aspirin EC 81 MG tablet Take 81 mg by mouth daily.  [name] Kitchen atorvastatin (LIPITOR) 20 MG tablet Take 20 mg by mouth daily.  . bisoprolol (ZEBETA) 5 MG tablet Take 1 tablet (5 mg total) by mouth daily.  . budesonide-formoterol (SYMBICORT) 160-4.5 MCG/ACT inhaler Inhale 2 puffs into the lungs 2 (two) times daily.  . furosemide (LASIX) 20 MG tablet 2 am and 1 pm  . insulin glargine (LANTUS) 100 UNIT/ML  injection Inject 50 Units into the skin at bedtime.  . insulin lispro (HUMALOG) 100 UNIT/ML injection Inject 10 Units into the skin daily.  [name] Kitchen ipratropium-albuterol (DUONEB) 0.5-2.5 (3) MG/3ML SOLN Take 3 mLs by nebulization every 4 (four) hours as needed.  [name] Kitchen losartan (COZAAR) 100 MG tablet Take 1 tablet (100 mg total) by mouth daily.  . meclizine (ANTIVERT) 25 MG tablet Take 25 mg by mouth 3 (three) times daily as needed for dizziness.  . OXYGEN Oxygen 2lpm 24/7  AHC  . saxagliptin HCl (ONGLYZA) 2.5 MG TABS tablet Take 2.5 mg by mouth daily.   No facility-administered encounter medications on file as of 04/19/2017.      Review of Systems  Constitutional:   No  weight loss, night sweats,  Fevers, chills, + fatigue, or  lassitude.  HEENT:   No headaches,  Difficulty swallowing,  Tooth/dental problems, or  Sore throat,                No sneezing, itching, ear ache, nasal congestion, post nasal drip,   CV:  No chest pain,  Orthopnea, PND, swelling in lower extremities, anasarca, dizziness, palpitations, syncope.   GI  No heartburn, indigestion, abdominal pain, nausea, vomiting, diarrhea, change in bowel habits, loss of appetite, bloody stools.   Resp:    No chest wall deformity  Skin: no rash or lesions.  GU: no dysuria, change in color of urine, no urgency or frequency.  No flank pain, no hematuria   MS:  No joint pain or swelling.  No decreased range of motion.  No back pain.    Physical Exam  BP 122/64 (BP Location: Right Arm, Cuff Size: Normal)   Pulse 77   Ht _0  (1.575 m)   Wt 284 lb 12.8 oz (129.2 kg)   SpO2 98%   BMI 52.09 kg/m   GEN: A/Ox3; pleasant , NAD, elderly , obese in wc    HEENT:  [place]/AT,  EACs-clear, TMs-wnl, NOSE-clear, THROAT-clear, no lesions, no postnasal drip or exudate noted.   NECK:  Supple w/ fair ROM; no JVD; normal carotid impulses w/o bruits; no thyromegaly or nodules palpated; no lymphadenopathy.    RESP  Clear  P & A; w/o, wheezes/ rales/  or rhonchi. no accessory muscle use, no dullness to percussion  CARD:  RRR, no m/r/g, 1+ peripheral edema, pulses intact, no cyanosis or clubbing.  GI:   Soft & nt; nml bowel sounds; no organomegaly or masses detected.   Musco: Warm bil, no deformities or joint swelling noted.   Neuro: alert, no focal deficits noted.    Skin: Warm, no lesions or rashes    Lab Results:  CBC    Component Value Date/Time   WBC 4.7 04/05/2017 1710   RBC 4.41 04/05/2017 1710   HGB 12.1 04/05/2017 1710   HCT 38.3 04/05/2017 1710   PLT 200.0 04/05/2017 1710   MCV 87.0 04/05/2017 1710   MCHC 31.6 04/05/2017 1710   RDW  14.4 04/05/2017 1710   LYMPHSABS 0.8 04/05/2017 1710   MONOABS 0.5 04/05/2017 1710   EOSABS 0.2 04/05/2017 1710   BASOSABS 0.1 04/05/2017 1710    BMET    Component Value Date/Time   NA 140 04/05/2017 1710   K 4.2 04/05/2017 1710   CL 99 04/05/2017 1710   CO2 35 (H) 04/05/2017 1710   GLUCOSE 87 04/05/2017 1710   BUN 32 (H) 04/05/2017 1710   CREATININE 1.18 04/05/2017 1710   CALCIUM 9.4 04/05/2017 1710    BNP No results found for: BNP  ProBNP    Component Value Date/Time   PROBNP 109.0 (H) 04/05/2017 1710    Imaging: Dg Chest 2 View  Result Date: 04/06/2017 CLINICAL DATA:  66 year old female with progressive chronic shortness of breath. Shortness of breath on exertion. EXAM: CHEST  2 VIEW COMPARISON:  [facility] chest radiographs 03/02/2017, and earlier. Chest CTA 12/31/2016. FINDINGS: Chronically low lung volumes, mildly improved today versus October. Stable right chest porta cath. Cardiac size and mediastinal contours are stable and within normal limits. Visualized tracheal air column is within normal limits. Chronic bilateral mid lung streaky linear opacity most resembles atelectasis and/or scarring and is stable. No pneumothorax, pulmonary edema, pleural effusion or acute pulmonary opacity. No acute osseous abnormality identified. Negative visible bowel gas  pattern. IMPRESSION: 1. Chronically low lung volumes with mid lung atelectasis or scarring. 2.  No acute cardiopulmonary abnormality. Electronically Signed   By: Genevie Ann M.D.   On: 04/06/2017 09:07     Assessment & Plan:   Chronic respiratory failure with hypoxia and hypercapnia ([place]) Suspect patient has some underlying OSA and OHS.  We will set patient up for a sleep study split night.  Patient is continue on oxygen to keep O2 saturations greater than 88-90%.   Cough variant asthma  vs UACS from ACEi  Appears to be improved control off of ACE inhibitor.  She is to continue on Symbicort twice daily. .Patient's medications were reviewed today and patient education was given. Computerized medication calendar was adjusted/completed    Cor pulmonale (chronic) ([place]) Patient does not appear to be in decompensated congestive heart failure.  She is continue on her current regimen.  She may have underlying obstructive sleep apnea that may be contributing to her P HTN . For now would set up for sleep study to see if OSA. Would do in lab study as on oxygen and D CHF .       Rexene Edison, NP 04/19/2017

## 2017-04-19 NOTE — Patient Instructions (Signed)
Continue on current regimen .  Set up for Sleep study .  Follow med calendar closely and bring to each visit.  Follow up with with Dr. Melvyn Novas  In 6-8 weeks and As needed   Please contact office for sooner follow up if symptoms do not improve or worsen or seek emergency care

## 2017-04-19 NOTE — Assessment & Plan Note (Signed)
Patient does not appear to be in decompensated congestive heart failure.  She is continue on her current regimen.  She may have underlying obstructive sleep apnea that may be contributing to her P HTN . For now would set up for sleep study to see if OSA. Would do in lab study as on oxygen and D CHF .

## 2017-04-20 DIAGNOSIS — E785 Hyperlipidemia, unspecified: Secondary | ICD-10-CM | POA: Insufficient documentation

## 2017-04-20 NOTE — Progress Notes (Signed)
Chart and office note reviewed in detail  > agree with a/p as outlined

## 2017-04-22 DIAGNOSIS — J449 Chronic obstructive pulmonary disease, unspecified: Secondary | ICD-10-CM | POA: Diagnosis not present

## 2017-04-24 NOTE — Addendum Note (Signed)
Addended by: Parke Poisson E on: 04/24/2017 04:52 PM   Modules accepted: Orders

## 2017-05-10 DIAGNOSIS — J9690 Respiratory failure, unspecified, unspecified whether with hypoxia or hypercapnia: Secondary | ICD-10-CM | POA: Diagnosis not present

## 2017-05-15 ENCOUNTER — Ambulatory Visit (HOSPITAL_BASED_OUTPATIENT_CLINIC_OR_DEPARTMENT_OTHER): Payer: Medicare Other | Attending: Adult Health | Admitting: Pulmonary Disease

## 2017-05-15 DIAGNOSIS — E119 Type 2 diabetes mellitus without complications: Secondary | ICD-10-CM | POA: Diagnosis not present

## 2017-05-15 DIAGNOSIS — Z6841 Body Mass Index (BMI) 40.0 and over, adult: Secondary | ICD-10-CM | POA: Diagnosis not present

## 2017-05-15 DIAGNOSIS — E669 Obesity, unspecified: Secondary | ICD-10-CM | POA: Diagnosis not present

## 2017-05-15 DIAGNOSIS — I10 Essential (primary) hypertension: Secondary | ICD-10-CM | POA: Insufficient documentation

## 2017-05-15 DIAGNOSIS — R0683 Snoring: Secondary | ICD-10-CM | POA: Insufficient documentation

## 2017-05-15 DIAGNOSIS — I493 Ventricular premature depolarization: Secondary | ICD-10-CM | POA: Insufficient documentation

## 2017-05-16 ENCOUNTER — Telehealth: Payer: Self-pay | Admitting: Internal Medicine

## 2017-05-16 NOTE — Telephone Encounter (Signed)
OV note has been faxed. Nothing further was needed.

## 2017-05-18 NOTE — Procedures (Signed)
Patient Name: Tammy Mathews, Tammy Mathews Date: 05/15/2017   Gender: Female  D.O.B: 18-Dec-1950  Age (years): 76  Referring Provider: Tammy Parrett  Height (inches): 61  Interpreting Physician: Chesley Mires MD, ABSM  Weight (lbs): 282  RPSGT: Carolin Coy  BMI: 31  MRN: [id number]  Neck Size: 15.50   CLINICAL INFORMATION  Sleep Study Type: NPSG Indication for sleep study: Diabetes, Hypertension, Obesity, Snoring Epworth Sleepiness Score: SLEEP STUDY TECHNIQUE  As per the AASM Manual for the Scoring of Sleep and Associated Events v2.3 (April 2016) with a hypopnea requiring 4% desaturations. The channels recorded and monitored were frontal, central and occipital EEG, electrooculogram (EOG), submentalis EMG (chin), nasal and oral airflow, thoracic and abdominal wall motion, anterior tibialis EMG, snore microphone, electrocardiogram, and pulse oximetry. MEDICATIONS  Medications self-administered by patient taken the night of the study : N/A SLEEP ARCHITECTURE  The study was initiated at 11:09:09 PM and ended at 5:08:22 AM. Sleep onset time was 19.4 minutes and the sleep efficiency was 56.1%. The total sleep time was 201.5 minutes. Stage REM latency was 114.0 minutes. The patient spent 12.66% of the night in stage N1 sleep, 65.01% in stage N2 sleep, 0.00% in stage N3 and 22.33% in REM. Alpha intrusion was absent. Supine sleep was 0.00%. RESPIRATORY PARAMETERS  The overall apnea/hypopnea index (AHI) was 1.2 per hour. There were 1 total apneas, including 1 obstructive, 0 central and 0 mixed apneas. There were 3 hypopneas and 9 RERAs. The AHI during Stage REM sleep was 5.3 per hour. AHI while supine was N/A per hour. The mean oxygen saturation was 95.97%. The minimum SpO2 during sleep was 92.00%. The study was performed with her using her baseline therapy of 2 liters oxygen. soft snoring was noted during this study. CARDIAC DATA  The 2 lead EKG demonstrated sinus rhythm. The mean  heart rate was 61.50 beats per minute. Other EKG findings include: PVCs.  LEG MOVEMENT DATA  The total PLMS were 0 with a resulting PLMS index of 0.00. Associated arousal with leg movement index was 0.0 . IMPRESSIONS  - No significant obstructive sleep apnea occurred during this study (AHI = 1.2/h). - No significant central sleep apnea occurred during this study (CAI = 0.0/h).  - Mild oxygen desaturation was noted during this study (Min O2 = 92.00%).  - The patient snored with soft snoring volume.  - EKG findings include PVCs.  - Clinically significant periodic limb movements did not occur during sleep. No significant associated arousals. DIAGNOSIS  - Snoring (R06.83) RECOMMENDATIONS  - Avoid alcohol, sedatives and other CNS depressants that may worsen sleep apnea and disrupt normal sleep architecture. - Sleep hygiene should be reviewed to assess factors that may improve sleep quality.  - Weight management and regular exercise should be initiated or continued if appropriate. [Electronically signed] 05/18/2017 04:36 AM Chesley Mires MD, ABSM  Diplomate, American Board of Sleep Medicine  NPI: [id number]

## 2017-05-23 DIAGNOSIS — J449 Chronic obstructive pulmonary disease, unspecified: Secondary | ICD-10-CM | POA: Diagnosis not present

## 2017-05-30 DIAGNOSIS — I119 Hypertensive heart disease without heart failure: Secondary | ICD-10-CM | POA: Diagnosis not present

## 2017-05-30 DIAGNOSIS — I480 Paroxysmal atrial fibrillation: Secondary | ICD-10-CM | POA: Diagnosis not present

## 2017-05-30 DIAGNOSIS — I071 Rheumatic tricuspid insufficiency: Secondary | ICD-10-CM | POA: Diagnosis not present

## 2017-05-30 DIAGNOSIS — J45909 Unspecified asthma, uncomplicated: Secondary | ICD-10-CM | POA: Diagnosis not present

## 2017-05-30 DIAGNOSIS — E785 Hyperlipidemia, unspecified: Secondary | ICD-10-CM | POA: Diagnosis not present

## 2017-05-30 DIAGNOSIS — I5189 Other ill-defined heart diseases: Secondary | ICD-10-CM | POA: Insufficient documentation

## 2017-06-01 ENCOUNTER — Encounter: Payer: Self-pay | Admitting: Internal Medicine

## 2017-06-01 ENCOUNTER — Other Ambulatory Visit (INDEPENDENT_AMBULATORY_CARE_PROVIDER_SITE_OTHER): Payer: Medicare Other

## 2017-06-01 ENCOUNTER — Ambulatory Visit (INDEPENDENT_AMBULATORY_CARE_PROVIDER_SITE_OTHER): Payer: Medicare Other | Admitting: Internal Medicine

## 2017-06-01 VITALS — BP 130/60 | HR 74 | Ht 61.5 in | Wt 290.2 lb

## 2017-06-01 DIAGNOSIS — R0609 Other forms of dyspnea: Secondary | ICD-10-CM | POA: Diagnosis not present

## 2017-06-01 DIAGNOSIS — J9612 Chronic respiratory failure with hypercapnia: Secondary | ICD-10-CM | POA: Diagnosis not present

## 2017-06-01 DIAGNOSIS — I2781 Cor pulmonale (chronic): Secondary | ICD-10-CM

## 2017-06-01 DIAGNOSIS — I10 Essential (primary) hypertension: Secondary | ICD-10-CM | POA: Diagnosis not present

## 2017-06-01 DIAGNOSIS — J45991 Cough variant asthma: Secondary | ICD-10-CM | POA: Diagnosis not present

## 2017-06-01 DIAGNOSIS — J9611 Chronic respiratory failure with hypoxia: Secondary | ICD-10-CM

## 2017-06-01 LAB — BASIC METABOLIC PANEL
BUN: 36 mg/dL — AB (ref 6–23)
CALCIUM: 9 mg/dL (ref 8.4–10.5)
CO2: 33 meq/L — AB (ref 19–32)
CREATININE: 1.27 mg/dL — AB (ref 0.40–1.20)
Chloride: 99 mEq/L (ref 96–112)
GFR: 44.45 mL/min — ABNORMAL LOW (ref >60.00)
GLUCOSE: 240 mg/dL — AB (ref 70–99)
Potassium: 4.5 mEq/L (ref 3.5–5.1)
Sodium: 138 mEq/L (ref 135–145)

## 2017-06-01 LAB — SEDIMENTATION RATE: Sed Rate: 34 mm/hr — ABNORMAL HIGH (ref 0–30)

## 2017-06-01 MED ORDER — BUDESONIDE-FORMOTEROL FUMARATE 80-4.5 MCG/ACT IN AERO: INHALATION_SPRAY | RESPIRATORY_TRACT | 12 refills | Status: AC

## 2017-06-01 NOTE — Patient Instructions (Addendum)
When you refill it, reduce symbicort to 80 Take 2 puffs first thing in am and then another 2 puffs about 12 hours later.   Work on inhaler technique:  relax and gently blow all the way out then take a nice smooth deep breath back in, triggering the inhaler at same time you start breathing in.  Hold for up to 5 seconds if you can. Blow out thru nose. Rinse and gargle with water when done    Please remember to go to the lab department downstairs in the basement  for your tests - we will call you with the results when they are available.      Please schedule a follow up visit in 3 months but call sooner if needed

## 2017-06-01 NOTE — Progress Notes (Signed)
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 acei (she's not sure)   with baseline wt around 260 started on 02 at hs with dx of copd/ rx with symbicort seemed better but a lot worse since early 2018 > hosp at [place] with dx of "flu" or "double pna" summer 2018  and referred to pulmonary clinic 04/05/2017 by Dr   Burnett Sheng with chronic resp failure/ [place] on Echo 10 03/02/17 and min airflow obst on initial spiriometry 04/05/17 which was restrictive presumably related to OHS     04/05/2017  f/u ov/Mera Gunkel re:  Chief Complaint  Patient presents with  . Pulmonary Consult    Referred by Dickey Gave, PA for eval of pulmonary hypertension. Pt states she has had SOB and has been on o2 "for years".  She states her breathing has been worse for the past 3 months. She states she gets SOB just walking from room to room at home.    30 degrees sleeping in recliner x years and doe x across the room so mostly w/c bound as well at this point on 2lpm 24/7 and denies ever being eval for osa but carries dx of copd in [place] records On chemo for breast ca summer of 6063 complicated by svt > rx amiodarone started and xarelto resulted in nose bleeds so she stopped this ? ? How long rec Plan A = Automatic = stop spiriva and continue Symbicort 160 Take 2 puffs first thing in am and then another 2 puffs about 12 hours later.  Work on inhaler technique:  relax and gently blow all the way out then take a nice smooth deep breath back in, triggering the inhaler at same time you start breathing in.  Hold for up to 5 seconds if you can. Blow out thru nose. Rinse and gargle with water when done Plan B = Backup Only use your albuterol (Ventolin) as a rescue medication Plan C = Crisis - only use your albuterol nebulizer if you first try Plan B and it fails to help > ok to  use the nebulizer up to every 4 hours but if start needing it regularly call for immediate appointment Stop lisinopril and start losartan 100 mg daily  And bisoprolol 5 mg daily  See Tammy NP w/in 2 weeks with all your medications     04/19/17 NP rec Continue on current regimen .  Set up for Sleep study .  Follow med calendar closely and bring to each visit.     06/01/2017  f/u ov/Adeana Grilliot re:  02 x 6 m/ no med calendar Chief Complaint  Patient presents with  . Follow-up    f/u pt states she is much better, she has not had to use her rescue inhaler much   no cough/ breathing better but still sleeping in recliner on 2lpm   No obvious day to day or daytime variability or assoc excess/ purulent sputum or mucus plugs or hemoptysis or cp or chest tightness, subjective wheeze or overt sinus or hb symptoms. No unusual exposure hx or h/o childhood pna/ asthma or knowledge of premature birth.  Sleeping ok in reclner  without nocturnal  or early am exacerbation  of respiratory  c/o's or need for noct saba. Also denies any obvious fluctuation of symptoms with weather or environmental changes or  other aggravating or alleviating factors except as outlined above   Current Allergies, Complete Past Medical History, Past Surgical History, Family History, and Social History were reviewed in [facility] record.  ROS  The following are not active complaints unless bolded Hoarseness, sore throat, dysphagia, dental problems, itching, sneezing,  nasal congestion or discharge of excess mucus or purulent secretions, ear ache,   fever, chills, sweats, unintended wt loss or wt gain, classically pleuritic or exertional cp,  orthopnea pnd or leg swelling, presyncope, palpitations, abdominal pain, anorexia, nausea, vomiting, diarrhea  or change in bowel habits or change in bladder habits, change in stools or change in urine, dysuria, hematuria,  rash, arthralgias, visual complaints, headache,  numbness, weakness or ataxia or problems with walking or coordination,  change in mood/affect or memory.        Current Meds  Medication Sig  . albuterol (PROVENTIL HFA;VENTOLIN HFA) 108 (90 Base) MCG/ACT inhaler Inhale 2 puffs into the lungs every 4 (four) hours as needed for wheezing or shortness of breath.   [name] Kitchen amiodarone (PACERONE) 200 MG tablet Take 200 mg by mouth daily.  [name] Kitchen anastrozole (ARIMIDEX) 1 MG tablet Take 1 mg by mouth daily.  [name] Kitchen aspirin EC 81 MG tablet Take 81 mg by mouth daily.  [name] Kitchen atorvastatin (LIPITOR) 20 MG tablet Take 20 mg by mouth daily.  . bisoprolol (ZEBETA) 5 MG tablet Take 1 tablet (5 mg total) by mouth daily.  . furosemide (LASIX) 20 MG tablet 2 am and 1 pm  . insulin glargine (LANTUS) 100 UNIT/ML injection Inject 50 Units into the skin at bedtime.  . insulin lispro (HUMALOG) 100 UNIT/ML injection Inject 10 Units into the skin every evening.   [name] Kitchen losartan (COZAAR) 100 MG tablet Take 1 tablet (100 mg total) by mouth daily.  . meclizine (ANTIVERT) 25 MG tablet Take 25 mg by mouth every 6 (six) hours as needed for dizziness.   . OXYGEN Oxygen 2lpm 24/7  [facility]  . [  budesonide-formoterol (SYMBICORT) 160-4.5 MCG/ACT inhaler Inhale 2 puffs into the lungs 2 (two) times daily.                 Objective:   Physical Exam  W/c bound elderly obese wf nad  Wt Readings from Last 3 Encounters:  06/01/17 290 lb 3.2 oz (131.6 kg)  05/15/17 282 lb (127.9 kg)  04/19/17 284 lb 12.8 oz (129.2 kg)     Vital signs reviewed - Note on arrival 02 sats  99% on RA        HEENT: very poordentition,  nl turbinates bilaterally, and oropharynx. Nl external ear canals without cough reflex   NECK :  without JVD/Nodes/TM/ nl carotid upstrokes bilaterally   LUNGS: no acc muscle use,  Nl contour chest which is clear to A and P though bs distant bilaterally    CV:  RRR  no s3 or murmur or increase in P2, and  - both lower ext tightly wrapped    ABD:  soft and nontender with nl  inspiratory excursion in the supine position. No bruits or organomegaly appreciated, bowel sounds nl  MS:    ext warm without deformities, calf tenderness, cyanosis or clubbing No obvious joint restrictions   SKIN: warm and dry without lesions    NEURO:  alert, approp, nl sensorium with  no motor or cerebellar deficits apparent.            Labs ordered/ Reviewed 06/01/2017       Chemistry  Component Value Date/Time   NA 138 06/01/2017 1412   K 4.5 06/01/2017 1412   CL 99 06/01/2017 1412   CO2 33 (H) 06/01/2017 1412   BUN 36 (H) 06/01/2017 1412   CREATININE 1.27 (H) 06/01/2017 1412      Component Value Date/Time   CALCIUM 9.0 06/01/2017 1412                 Lab Results  Component Value Date   ESRSEDRATE 34 (H) 06/01/2017   ESRSEDRATE 47 (H) 04/05/2017           Assessment:

## 2017-06-03 ENCOUNTER — Encounter: Payer: Self-pay | Admitting: Internal Medicine

## 2017-06-03 NOTE — Assessment & Plan Note (Signed)
04/05/2017  After extensive coaching HFA effectiveness =    75% from a baseline on < 25% - Try off acei 04/05/2017 and off corgard same date  - Spirometry 04/05/2017  FEV1 0.89 (42%)  Ratio 71 with mild curvature p am symb/spiriva    - 06/01/2017  After extensive coaching inhaler device  effectiveness =  50  % from baseline near 0 so reduce symb to80 2bid and consider d/c at next ov   Clearly better off acei so this is more likely uacs than asthma and her problems now are mostly restrictive/ not obstructive.    I had an extended discussion with the patient reviewing all relevant studies completed to date and  lasting 15 to 20 minutes of a 25 minute visit    Each maintenance medication was reviewed in detail including most importantly the difference between maintenance and prns and under what circumstances the prns are to be triggered using an action plan format that is not reflected in the computer generated alphabetically organized AVS.    Please see AVS for specific instructions unique to this visit that I personally wrote and verbalized to the the pt in detail and then reviewed with pt  by my nurse highlighting any  changes in therapy recommended at today's visit to their plan of care.

## 2017-06-03 NOTE — Assessment & Plan Note (Signed)
See Echo 03/02/17 with [place] in setting of moderate diastolic dysfunction with mod LAE so would need [facility] to document   Really moot issue now because if she has it at all likely WHO III where recs are to treat the underlying problem > OHS

## 2017-06-03 NOTE — Assessment & Plan Note (Signed)
Try off acei 04/05/2017 due to pseudocopd and off corgard due to suspected asthma   Adequate control on present rx, reviewed in detail with pt > no change in rx needed  > Follow up per Primary Care planned

## 2017-06-03 NOTE — Assessment & Plan Note (Signed)
HCO3   04/05/2017  = 35   - ONO 2lpm  04/06/2017 >>> not done as of 06/01/2017 > repeat request   - HCO3 06/01/2017 = 33  So trending down slightly

## 2017-06-03 NOTE — Assessment & Plan Note (Signed)
Trial off acei, corgard, and spiriva dpi 04/05/17> improved 06/01/2017   Limited by obesity mostly now

## 2017-06-05 NOTE — Progress Notes (Signed)
Left detailed msg on her VM

## 2017-06-06 DIAGNOSIS — I10 Essential (primary) hypertension: Secondary | ICD-10-CM | POA: Diagnosis not present

## 2017-06-06 DIAGNOSIS — E1129 Type 2 diabetes mellitus with other diabetic kidney complication: Secondary | ICD-10-CM | POA: Diagnosis not present

## 2017-06-06 DIAGNOSIS — E1165 Type 2 diabetes mellitus with hyperglycemia: Secondary | ICD-10-CM | POA: Diagnosis not present

## 2017-06-06 DIAGNOSIS — J449 Chronic obstructive pulmonary disease, unspecified: Secondary | ICD-10-CM | POA: Diagnosis not present

## 2017-06-06 DIAGNOSIS — R0600 Dyspnea, unspecified: Secondary | ICD-10-CM | POA: Diagnosis not present

## 2017-06-06 DIAGNOSIS — E7800 Pure hypercholesterolemia, unspecified: Secondary | ICD-10-CM | POA: Diagnosis not present

## 2017-06-10 DIAGNOSIS — J9690 Respiratory failure, unspecified, unspecified whether with hypoxia or hypercapnia: Secondary | ICD-10-CM | POA: Diagnosis not present

## 2017-06-12 ENCOUNTER — Telehealth: Payer: Self-pay | Admitting: Internal Medicine

## 2017-06-12 NOTE — Telephone Encounter (Signed)
Not clear what's been done so far - spit night study and ono RA both ordered - the former by Willette Brace NP

## 2017-06-12 NOTE — Telephone Encounter (Signed)
Pt is requesting sleep study results from 05/24/17  MW please advise. Thanks.   AVS 06/01/17: Patient Instructions    When you refill it, reduce symbicort to 80 Take 2 puffs first thing in am and then another 2 puffs about 12 hours later.   Work on inhaler technique:  relax and gently blow all the way out then take a nice smooth deep breath back in, triggering the inhaler at same time you start breathing in.  Hold for up to 5 seconds if you can. Blow out thru nose. Rinse and gargle with water when done    Please remember to go to the lab department downstairs in the basement  for your tests - we will call you with the results when they are available.      Please schedule a follow up visit in 3 months but call sooner if needed            After Visit Summary (Printed 06/01/2017)

## 2017-06-13 NOTE — Telephone Encounter (Signed)
Called patient, unable to reach LMTCB x 1

## 2017-06-14 NOTE — Telephone Encounter (Signed)
It appears that sleep study is still "in process" and has not yet been interpreted.  Will await results.

## 2017-06-16 ENCOUNTER — Other Ambulatory Visit: Payer: Self-pay | Admitting: Internal Medicine

## 2017-06-19 NOTE — Telephone Encounter (Signed)
?  Where are the studies ordered?

## 2017-06-19 NOTE — Telephone Encounter (Signed)
Sleep study is neg for sleep apnea.  Did well on O2 at 2l/m    Sleep study    No significant obstructive sleep apnea occurred during this study (AHI = 1.2/h).  - No significant central sleep apnea occurred during this study (CAI = 0.0/h).   - Mild oxygen desaturation was noted during this study (Min O2 = 92.00%).   - The patient snored with soft snoring volume.   - EKG findings include PVCs.   - Clinically significant periodic limb movements did not occur during sleep. No significant associated arousals.

## 2017-06-19 NOTE — Telephone Encounter (Signed)
Called and spoke with patient, she is aware nothing further needed.

## 2017-06-19 NOTE — Telephone Encounter (Signed)
TP please advise on the split night study results, ordered on 1.8.19  Please be advised the results are under the notes tab not the procedures. Thanks!

## 2017-06-19 NOTE — Telephone Encounter (Signed)
MW please advise on this.

## 2017-06-20 DIAGNOSIS — I890 Lymphedema, not elsewhere classified: Secondary | ICD-10-CM | POA: Diagnosis not present

## 2017-06-23 DIAGNOSIS — J449 Chronic obstructive pulmonary disease, unspecified: Secondary | ICD-10-CM | POA: Diagnosis not present

## 2017-07-02 DIAGNOSIS — C50912 Malignant neoplasm of unspecified site of left female breast: Secondary | ICD-10-CM | POA: Diagnosis not present

## 2017-07-02 DIAGNOSIS — R748 Abnormal levels of other serum enzymes: Secondary | ICD-10-CM | POA: Diagnosis not present

## 2017-07-02 DIAGNOSIS — Z170 Estrogen receptor positive status [ER+]: Secondary | ICD-10-CM | POA: Diagnosis not present

## 2017-07-02 DIAGNOSIS — C50512 Malignant neoplasm of lower-outer quadrant of left female breast: Secondary | ICD-10-CM | POA: Diagnosis not present

## 2017-07-02 DIAGNOSIS — Z79811 Long term (current) use of aromatase inhibitors: Secondary | ICD-10-CM | POA: Diagnosis not present

## 2017-07-02 DIAGNOSIS — Z9221 Personal history of antineoplastic chemotherapy: Secondary | ICD-10-CM | POA: Diagnosis not present

## 2017-07-08 DIAGNOSIS — J9690 Respiratory failure, unspecified, unspecified whether with hypoxia or hypercapnia: Secondary | ICD-10-CM | POA: Diagnosis not present

## 2017-07-19 DIAGNOSIS — C50919 Malignant neoplasm of unspecified site of unspecified female breast: Secondary | ICD-10-CM | POA: Diagnosis not present

## 2017-07-19 DIAGNOSIS — I87313 Chronic venous hypertension (idiopathic) with ulcer of bilateral lower extremity: Secondary | ICD-10-CM | POA: Diagnosis not present

## 2017-07-19 DIAGNOSIS — I890 Lymphedema, not elsewhere classified: Secondary | ICD-10-CM | POA: Diagnosis not present

## 2017-07-19 DIAGNOSIS — L97812 Non-pressure chronic ulcer of other part of right lower leg with fat layer exposed: Secondary | ICD-10-CM | POA: Diagnosis not present

## 2017-07-19 DIAGNOSIS — J45909 Unspecified asthma, uncomplicated: Secondary | ICD-10-CM | POA: Diagnosis not present

## 2017-07-19 DIAGNOSIS — M1990 Unspecified osteoarthritis, unspecified site: Secondary | ICD-10-CM | POA: Diagnosis not present

## 2017-07-19 DIAGNOSIS — L97822 Non-pressure chronic ulcer of other part of left lower leg with fat layer exposed: Secondary | ICD-10-CM | POA: Diagnosis not present

## 2017-07-19 DIAGNOSIS — Z794 Long term (current) use of insulin: Secondary | ICD-10-CM | POA: Diagnosis not present

## 2017-07-19 DIAGNOSIS — I110 Hypertensive heart disease with heart failure: Secondary | ICD-10-CM | POA: Diagnosis not present

## 2017-07-19 DIAGNOSIS — E119 Type 2 diabetes mellitus without complications: Secondary | ICD-10-CM | POA: Diagnosis not present

## 2017-07-19 DIAGNOSIS — I509 Heart failure, unspecified: Secondary | ICD-10-CM | POA: Diagnosis not present

## 2017-07-19 DIAGNOSIS — Z79899 Other long term (current) drug therapy: Secondary | ICD-10-CM | POA: Diagnosis not present

## 2017-07-21 DIAGNOSIS — J449 Chronic obstructive pulmonary disease, unspecified: Secondary | ICD-10-CM | POA: Diagnosis not present

## 2017-07-24 DIAGNOSIS — I10 Essential (primary) hypertension: Secondary | ICD-10-CM | POA: Diagnosis not present

## 2017-07-24 DIAGNOSIS — E119 Type 2 diabetes mellitus without complications: Secondary | ICD-10-CM | POA: Diagnosis not present

## 2017-07-24 DIAGNOSIS — I890 Lymphedema, not elsewhere classified: Secondary | ICD-10-CM | POA: Diagnosis not present

## 2017-07-24 DIAGNOSIS — I87313 Chronic venous hypertension (idiopathic) with ulcer of bilateral lower extremity: Secondary | ICD-10-CM | POA: Diagnosis not present

## 2017-08-02 DIAGNOSIS — L97822 Non-pressure chronic ulcer of other part of left lower leg with fat layer exposed: Secondary | ICD-10-CM | POA: Diagnosis not present

## 2017-08-02 DIAGNOSIS — I890 Lymphedema, not elsewhere classified: Secondary | ICD-10-CM | POA: Diagnosis not present

## 2017-08-02 DIAGNOSIS — L97812 Non-pressure chronic ulcer of other part of right lower leg with fat layer exposed: Secondary | ICD-10-CM | POA: Diagnosis not present

## 2017-08-08 DIAGNOSIS — J9690 Respiratory failure, unspecified, unspecified whether with hypoxia or hypercapnia: Secondary | ICD-10-CM | POA: Diagnosis not present

## 2017-08-09 DIAGNOSIS — E119 Type 2 diabetes mellitus without complications: Secondary | ICD-10-CM | POA: Diagnosis not present

## 2017-08-09 DIAGNOSIS — I890 Lymphedema, not elsewhere classified: Secondary | ICD-10-CM | POA: Diagnosis not present

## 2017-08-09 DIAGNOSIS — L97822 Non-pressure chronic ulcer of other part of left lower leg with fat layer exposed: Secondary | ICD-10-CM | POA: Diagnosis not present

## 2017-08-09 DIAGNOSIS — I872 Venous insufficiency (chronic) (peripheral): Secondary | ICD-10-CM | POA: Diagnosis not present

## 2017-08-20 DIAGNOSIS — I87311 Chronic venous hypertension (idiopathic) with ulcer of right lower extremity: Secondary | ICD-10-CM | POA: Diagnosis not present

## 2017-08-20 DIAGNOSIS — E119 Type 2 diabetes mellitus without complications: Secondary | ICD-10-CM | POA: Diagnosis not present

## 2017-08-20 DIAGNOSIS — L97821 Non-pressure chronic ulcer of other part of left lower leg limited to breakdown of skin: Secondary | ICD-10-CM | POA: Diagnosis not present

## 2017-08-20 DIAGNOSIS — I890 Lymphedema, not elsewhere classified: Secondary | ICD-10-CM | POA: Diagnosis not present

## 2017-08-21 DIAGNOSIS — J449 Chronic obstructive pulmonary disease, unspecified: Secondary | ICD-10-CM | POA: Diagnosis not present

## 2017-08-21 DIAGNOSIS — Z1231 Encounter for screening mammogram for malignant neoplasm of breast: Secondary | ICD-10-CM | POA: Diagnosis not present

## 2017-08-23 ENCOUNTER — Other Ambulatory Visit: Payer: Self-pay | Admitting: Internal Medicine

## 2017-08-23 DIAGNOSIS — E119 Type 2 diabetes mellitus without complications: Secondary | ICD-10-CM | POA: Diagnosis not present

## 2017-08-23 DIAGNOSIS — I890 Lymphedema, not elsewhere classified: Secondary | ICD-10-CM | POA: Diagnosis not present

## 2017-08-27 DIAGNOSIS — Z09 Encounter for follow-up examination after completed treatment for conditions other than malignant neoplasm: Secondary | ICD-10-CM | POA: Diagnosis not present

## 2017-08-27 DIAGNOSIS — E119 Type 2 diabetes mellitus without complications: Secondary | ICD-10-CM | POA: Diagnosis not present

## 2017-08-27 DIAGNOSIS — Z8679 Personal history of other diseases of the circulatory system: Secondary | ICD-10-CM | POA: Diagnosis not present

## 2017-08-27 DIAGNOSIS — L97829 Non-pressure chronic ulcer of other part of left lower leg with unspecified severity: Secondary | ICD-10-CM | POA: Diagnosis not present

## 2017-08-27 DIAGNOSIS — Z872 Personal history of diseases of the skin and subcutaneous tissue: Secondary | ICD-10-CM | POA: Diagnosis not present

## 2017-08-27 DIAGNOSIS — I87312 Chronic venous hypertension (idiopathic) with ulcer of left lower extremity: Secondary | ICD-10-CM | POA: Diagnosis not present

## 2017-08-31 ENCOUNTER — Ambulatory Visit (INDEPENDENT_AMBULATORY_CARE_PROVIDER_SITE_OTHER): Payer: Medicare Other | Admitting: Internal Medicine

## 2017-08-31 ENCOUNTER — Encounter: Payer: Self-pay | Admitting: Internal Medicine

## 2017-08-31 VITALS — BP 122/76 | HR 75 | Ht 61.0 in | Wt 286.0 lb

## 2017-08-31 DIAGNOSIS — J9611 Chronic respiratory failure with hypoxia: Secondary | ICD-10-CM | POA: Diagnosis not present

## 2017-08-31 DIAGNOSIS — J9612 Chronic respiratory failure with hypercapnia: Secondary | ICD-10-CM | POA: Diagnosis not present

## 2017-08-31 DIAGNOSIS — I2781 Cor pulmonale (chronic): Secondary | ICD-10-CM | POA: Diagnosis not present

## 2017-08-31 DIAGNOSIS — J45991 Cough variant asthma: Secondary | ICD-10-CM

## 2017-08-31 NOTE — Patient Instructions (Addendum)
Please see patient coordinator before you leave today  to schedule ambulatory 02 titration to see if eligible for POC    Goal is to keep your 02 sats above 90% at all times    Please schedule a follow up visit in 6  months but call sooner if needed

## 2017-08-31 NOTE — Progress Notes (Signed)
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 acei (she's not sure)   with baseline wt around 260 started on 02 at hs with dx of copd/ rx with symbicort seemed better but a lot worse since early 2018 > hosp at [place] with dx of "flu" or "double pna" summer 2018  and referred to pulmonary clinic 04/05/2017 by Dr   Burnett Sheng with chronic resp failure/ [place] on Echo 10 03/02/17 and min airflow obst on initial spiriometry 04/05/17 which was restrictive presumably related to OHS    History of Present Illness  04/05/2017  f/u ov/Tenea Sens re:  Chief Complaint  Patient presents with  . Pulmonary Consult    Referred by Dickey Gave, PA for eval of pulmonary hypertension. Pt states she has had SOB and has been on o2 "for years".  She states her breathing has been worse for the past 3 months. She states she gets SOB just walking from room to room at home.    30 degrees sleeping in recliner x years and doe x across the room so mostly w/c bound as well at this point on 2lpm 24/7 and denies ever being eval for osa but carries dx of copd in [place] records On chemo for breast ca summer of 4098 complicated by svt > rx amiodarone started and xarelto resulted in nose bleeds so she stopped this ? ? How long rec Plan A = Automatic = stop spiriva and continue Symbicort 160 Take 2 puffs first thing in am and then another 2 puffs about 12 hours later.  Work on inhaler technique:  relax and gently blow all the way out then take a nice smooth deep breath back in, triggering the inhaler at same time you start breathing in.  Hold for up to 5 seconds if you can. Blow out thru nose. Rinse and gargle with water when done Plan B = Backup Only use your albuterol (Ventolin) as a rescue medication Plan C = Crisis - only use your albuterol nebulizer if  you first try Plan B and it fails to help > ok to use the nebulizer up to every 4 hours but if start needing it regularly call for immediate appointment Stop lisinopril and start losartan 100 mg daily  And bisoprolol 5 mg daily  See Tammy NP w/in 2 weeks with all your medications     04/19/17 NP rec Continue on current regimen .  Set up for Sleep study .  Follow med calendar closely and bring to each visit.     06/01/2017  f/u ov/Irisa Grimsley re:  02 x 6 m/ no med calendar Chief Complaint  Patient presents with  . Follow-up    f/u pt states she is much better, she has not had to use her rescue inhaler much   no cough/ breathing better but still sleeping in recliner on 2lpm  rec When you refill it, reduce symbicort to 80 Take 2 puffs first thing in am and then another 2 puffs about 12 hours later. Work on inhaler technique:     08/31/2017  f/u ov/Biff Rutigliano re: OHS/ no med calendar 0 dep at 2lpm but does not monitor sats with activity as rec  Chief Complaint  Patient presents with  . Follow-up    Breathing is overall doing well. She  is using her albuterol inhaler maybe once per wk on average.   Dyspnea:  Walk across [place] and into church s stopping but this is about all she ever does outside the house  Cough: none Sleep: recliner 45 degrees on 2lpm  SABA use:   Only once a week and no difference since changed to  lower strength symb    No obvious day to day or daytime variability or assoc excess/ purulent sputum or mucus plugs or hemoptysis or cp or chest tightness, subjective wheeze or overt sinus or hb symptoms. No unusual exposure hx or h/o childhood pna/ asthma or knowledge of premature birth.  Sleeping  Recliner 45 degrees on 2lpm   without nocturnal  or early am exacerbation  of respiratory  c/o's or need for noct saba. Also denies any obvious fluctuation of symptoms with weather or environmental changes or other aggravating or alleviating factors except as outlined above   Current  Allergies, Complete Past Medical History, Past Surgical History, Family History, and Social History were reviewed in [employer] record.  ROS  The following are not active complaints unless bolded Hoarseness, sore throat, dysphagia, dental problems, itching, sneezing,  nasal congestion or discharge of excess mucus or purulent secretions, ear ache,   fever, chills, sweats, unintended wt loss or wt gain, classically pleuritic or exertional cp,  orthopnea pnd or arm/hand swelling  or leg swelling, presyncope, palpitations, abdominal pain, anorexia, nausea, vomiting, diarrhea  or change in bowel habits or change in bladder habits, change in stools or change in urine, dysuria, hematuria,  rash, arthralgias, visual complaints, headache, numbness, weakness or ataxia or problems with walking or coordination,  change in mood or  memory.        Current Meds  Medication Sig  . albuterol (PROVENTIL HFA;VENTOLIN HFA) 108 (90 Base) MCG/ACT inhaler Inhale 2 puffs into the lungs every 4 (four) hours as needed for wheezing or shortness of breath.   [name] Kitchen amiodarone (PACERONE) 200 MG tablet Take 200 mg by mouth daily.  [name] Kitchen anastrozole (ARIMIDEX) 1 MG tablet Take 1 mg by mouth daily.  [name] Kitchen aspirin EC 81 MG tablet Take 81 mg by mouth daily.  [name] Kitchen atorvastatin (LIPITOR) 20 MG tablet Take 20 mg by mouth daily.  . bisoprolol (ZEBETA) 5 MG tablet TAKE ONE TABLET BY MOUTH DAILY  . budesonide-formoterol (SYMBICORT) 80-4.5 MCG/ACT inhaler Take 2 puffs first thing in am and then another 2 puffs about 12 hours later.  . furosemide (LASIX) 20 MG tablet 2 am and 1 pm  . insulin glargine (LANTUS) 100 UNIT/ML injection Inject 50 Units into the skin at bedtime.  . insulin lispro (HUMALOG) 100 UNIT/ML injection Inject 10 Units into the skin every evening.   [name] Kitchen ipratropium-albuterol (DUONEB) 0.5-2.5 (3) MG/3ML SOLN Take 3 mLs by nebulization every 4 (four) hours as needed.  [name] Kitchen losartan (COZAAR) 100 MG tablet Take 1 tablet  (100 mg total) by mouth daily.  . meclizine (ANTIVERT) 25 MG tablet Take 25 mg by mouth every 6 (six) hours as needed for dizziness.   . OXYGEN Oxygen 2lpm 24/7  [facility]                       Objective:   Physical Exam  W/c bound pleasant wf nad   08/31/2017       286   06/01/17 290 lb 3.2 oz (131.6 kg)  05/15/17 282 lb (127.9 kg)  04/19/17 284 lb 12.8 oz (129.2 kg)  Vital signs reviewed - Note on arrival 02 sats  99% on 2lpm      HEENT: very poor  Dentition, nl  turbinates bilaterally, and oropharynx. Nl external ear canals without cough reflex   NECK :  without JVD/Nodes/TM/ nl carotid upstrokes bilaterally   LUNGS: no acc muscle use,  Nl contour chest with distant bs  bilaterally without wheeze or cough on insp or exp maneuvers   CV:  RRR  no s3 or murmur or increase in P2, and - both lower ext tightly wrapped in ace  ABD:  soft and nontender with nl inspiratory excursion in the supine position. No bruits or organomegaly appreciated, bowel sounds nl  MS:  Nl gait/ ext warm without deformities, calf tenderness, cyanosis or clubbing No obvious joint restrictions   SKIN: warm and dry without lesions    NEURO:  alert, approp, nl sensorium with  no motor or cerebellar deficits apparent.            Labs reviewed:   Lab Results on Amiodarone   Component Value Date   ESRSEDRATE 34 (H) 06/01/2017   ESRSEDRATE 47 (H) 04/05/2017                 Assessment:

## 2017-09-01 ENCOUNTER — Encounter: Payer: Self-pay | Admitting: Internal Medicine

## 2017-09-01 DIAGNOSIS — K7689 Other specified diseases of liver: Secondary | ICD-10-CM | POA: Diagnosis not present

## 2017-09-01 DIAGNOSIS — J449 Chronic obstructive pulmonary disease, unspecified: Secondary | ICD-10-CM | POA: Diagnosis not present

## 2017-09-01 NOTE — Assessment & Plan Note (Signed)
Body mass index is 54.04 kg/m.  -  trending down slightly  Lab Results  Component Value Date   TSH 1.93 04/05/2017     Contributing to gerd risk/ doe/reviewed the need and the process to achieve and maintain neg calorie balance which includes using enough 02 with activity to help burn fat > defer f/u primary care including intermittently monitoring thyroid status

## 2017-09-01 NOTE — Assessment & Plan Note (Signed)
Main rx is assure adequate 02 > ono on 2lpm requested

## 2017-09-01 NOTE — Assessment & Plan Note (Addendum)
04/05/2017  After extensive coaching HFA effectiveness =    75% from a baseline on < 25% - Try off acei 04/05/2017 and off corgard same date  - Spirometry 04/05/2017  FEV1 0.89 (42%)  Ratio 71 with mild curvature p am symb/spiriva   - 06/01/2017    reduced symb to 80 2bid     - The proper method of use, as well as anticipated side effects, of a metered-dose inhaler are discussed and demonstrated to the patient. Still only about 50% effective   Despite suboptimal hfa >> all goals of chronic asthma control met including optimal function and elimination of symptoms with minimal need for rescue therapy.  Contingencies discussed in full including contacting this office immediately if not controlling the symptoms using the rule of two's.

## 2017-09-01 NOTE — Assessment & Plan Note (Addendum)
-  HCO3 06/01/2017 = 33    Need to repeat ONO on 2lpm as never apparently done and titrate 02 daytime to keep > 90%  No evidence of amiodarone toxicity here/ ok to continue despite chronic resp failure

## 2017-09-03 ENCOUNTER — Other Ambulatory Visit: Payer: Self-pay | Admitting: Internal Medicine

## 2017-09-03 DIAGNOSIS — J9612 Chronic respiratory failure with hypercapnia: Principal | ICD-10-CM

## 2017-09-03 DIAGNOSIS — E7800 Pure hypercholesterolemia, unspecified: Secondary | ICD-10-CM | POA: Diagnosis not present

## 2017-09-03 DIAGNOSIS — J9611 Chronic respiratory failure with hypoxia: Secondary | ICD-10-CM

## 2017-09-03 DIAGNOSIS — I10 Essential (primary) hypertension: Secondary | ICD-10-CM | POA: Diagnosis not present

## 2017-09-03 DIAGNOSIS — E1122 Type 2 diabetes mellitus with diabetic chronic kidney disease: Secondary | ICD-10-CM | POA: Diagnosis not present

## 2017-09-07 DIAGNOSIS — J9690 Respiratory failure, unspecified, unspecified whether with hypoxia or hypercapnia: Secondary | ICD-10-CM | POA: Diagnosis not present

## 2017-09-20 DIAGNOSIS — J449 Chronic obstructive pulmonary disease, unspecified: Secondary | ICD-10-CM | POA: Diagnosis not present

## 2017-10-01 DIAGNOSIS — J449 Chronic obstructive pulmonary disease, unspecified: Secondary | ICD-10-CM | POA: Diagnosis not present

## 2017-10-01 DIAGNOSIS — K7689 Other specified diseases of liver: Secondary | ICD-10-CM | POA: Diagnosis not present

## 2017-10-08 DIAGNOSIS — J9690 Respiratory failure, unspecified, unspecified whether with hypoxia or hypercapnia: Secondary | ICD-10-CM | POA: Diagnosis not present

## 2017-10-11 DIAGNOSIS — Z79811 Long term (current) use of aromatase inhibitors: Secondary | ICD-10-CM | POA: Diagnosis not present

## 2017-10-11 DIAGNOSIS — N189 Chronic kidney disease, unspecified: Secondary | ICD-10-CM | POA: Diagnosis not present

## 2017-10-11 DIAGNOSIS — D631 Anemia in chronic kidney disease: Secondary | ICD-10-CM | POA: Diagnosis not present

## 2017-10-11 DIAGNOSIS — Z853 Personal history of malignant neoplasm of breast: Secondary | ICD-10-CM | POA: Diagnosis not present

## 2017-10-15 DIAGNOSIS — Z1211 Encounter for screening for malignant neoplasm of colon: Secondary | ICD-10-CM | POA: Diagnosis not present

## 2017-10-15 DIAGNOSIS — E1129 Type 2 diabetes mellitus with other diabetic kidney complication: Secondary | ICD-10-CM | POA: Diagnosis not present

## 2017-10-15 DIAGNOSIS — Z0000 Encounter for general adult medical examination without abnormal findings: Secondary | ICD-10-CM | POA: Diagnosis not present

## 2017-10-21 DIAGNOSIS — J449 Chronic obstructive pulmonary disease, unspecified: Secondary | ICD-10-CM | POA: Diagnosis not present

## 2017-11-05 ENCOUNTER — Other Ambulatory Visit: Payer: Self-pay | Admitting: Internal Medicine

## 2017-11-07 DIAGNOSIS — J9690 Respiratory failure, unspecified, unspecified whether with hypoxia or hypercapnia: Secondary | ICD-10-CM | POA: Diagnosis not present

## 2017-11-13 DIAGNOSIS — J45909 Unspecified asthma, uncomplicated: Secondary | ICD-10-CM | POA: Diagnosis not present

## 2017-11-13 DIAGNOSIS — Z794 Long term (current) use of insulin: Secondary | ICD-10-CM | POA: Diagnosis not present

## 2017-11-13 DIAGNOSIS — M1990 Unspecified osteoarthritis, unspecified site: Secondary | ICD-10-CM | POA: Diagnosis not present

## 2017-11-13 DIAGNOSIS — I509 Heart failure, unspecified: Secondary | ICD-10-CM | POA: Diagnosis not present

## 2017-11-13 DIAGNOSIS — I890 Lymphedema, not elsewhere classified: Secondary | ICD-10-CM | POA: Diagnosis not present

## 2017-11-13 DIAGNOSIS — I110 Hypertensive heart disease with heart failure: Secondary | ICD-10-CM | POA: Diagnosis not present

## 2017-11-13 DIAGNOSIS — C50919 Malignant neoplasm of unspecified site of unspecified female breast: Secondary | ICD-10-CM | POA: Diagnosis not present

## 2017-11-13 DIAGNOSIS — L97812 Non-pressure chronic ulcer of other part of right lower leg with fat layer exposed: Secondary | ICD-10-CM | POA: Diagnosis not present

## 2017-11-13 DIAGNOSIS — E11622 Type 2 diabetes mellitus with other skin ulcer: Secondary | ICD-10-CM | POA: Diagnosis not present

## 2017-11-20 DIAGNOSIS — I87313 Chronic venous hypertension (idiopathic) with ulcer of bilateral lower extremity: Secondary | ICD-10-CM | POA: Diagnosis not present

## 2017-11-20 DIAGNOSIS — J449 Chronic obstructive pulmonary disease, unspecified: Secondary | ICD-10-CM | POA: Diagnosis not present

## 2017-11-20 DIAGNOSIS — L97822 Non-pressure chronic ulcer of other part of left lower leg with fat layer exposed: Secondary | ICD-10-CM | POA: Diagnosis not present

## 2017-11-20 DIAGNOSIS — L97812 Non-pressure chronic ulcer of other part of right lower leg with fat layer exposed: Secondary | ICD-10-CM | POA: Diagnosis not present

## 2017-11-20 DIAGNOSIS — E11622 Type 2 diabetes mellitus with other skin ulcer: Secondary | ICD-10-CM | POA: Diagnosis not present

## 2017-11-27 DIAGNOSIS — L97822 Non-pressure chronic ulcer of other part of left lower leg with fat layer exposed: Secondary | ICD-10-CM | POA: Diagnosis not present

## 2017-11-27 DIAGNOSIS — L97812 Non-pressure chronic ulcer of other part of right lower leg with fat layer exposed: Secondary | ICD-10-CM | POA: Diagnosis not present

## 2017-11-27 DIAGNOSIS — E11622 Type 2 diabetes mellitus with other skin ulcer: Secondary | ICD-10-CM | POA: Diagnosis not present

## 2017-12-04 DIAGNOSIS — L97819 Non-pressure chronic ulcer of other part of right lower leg with unspecified severity: Secondary | ICD-10-CM | POA: Diagnosis not present

## 2017-12-04 DIAGNOSIS — L97829 Non-pressure chronic ulcer of other part of left lower leg with unspecified severity: Secondary | ICD-10-CM | POA: Diagnosis not present

## 2017-12-04 DIAGNOSIS — I87313 Chronic venous hypertension (idiopathic) with ulcer of bilateral lower extremity: Secondary | ICD-10-CM | POA: Diagnosis not present

## 2017-12-04 DIAGNOSIS — I890 Lymphedema, not elsewhere classified: Secondary | ICD-10-CM | POA: Diagnosis not present

## 2017-12-04 DIAGNOSIS — Z8639 Personal history of other endocrine, nutritional and metabolic disease: Secondary | ICD-10-CM | POA: Diagnosis not present

## 2017-12-04 DIAGNOSIS — Z8679 Personal history of other diseases of the circulatory system: Secondary | ICD-10-CM | POA: Diagnosis not present

## 2017-12-04 DIAGNOSIS — Z872 Personal history of diseases of the skin and subcutaneous tissue: Secondary | ICD-10-CM | POA: Diagnosis not present

## 2017-12-04 DIAGNOSIS — Z09 Encounter for follow-up examination after completed treatment for conditions other than malignant neoplasm: Secondary | ICD-10-CM | POA: Diagnosis not present

## 2017-12-08 DIAGNOSIS — J9690 Respiratory failure, unspecified, unspecified whether with hypoxia or hypercapnia: Secondary | ICD-10-CM | POA: Diagnosis not present

## 2017-12-11 DIAGNOSIS — L97812 Non-pressure chronic ulcer of other part of right lower leg with fat layer exposed: Secondary | ICD-10-CM | POA: Diagnosis not present

## 2017-12-11 DIAGNOSIS — L97822 Non-pressure chronic ulcer of other part of left lower leg with fat layer exposed: Secondary | ICD-10-CM | POA: Diagnosis not present

## 2017-12-11 DIAGNOSIS — L089 Local infection of the skin and subcutaneous tissue, unspecified: Secondary | ICD-10-CM | POA: Diagnosis not present

## 2017-12-11 DIAGNOSIS — I872 Venous insufficiency (chronic) (peripheral): Secondary | ICD-10-CM | POA: Diagnosis not present

## 2017-12-11 DIAGNOSIS — E11622 Type 2 diabetes mellitus with other skin ulcer: Secondary | ICD-10-CM | POA: Diagnosis not present

## 2017-12-11 DIAGNOSIS — I890 Lymphedema, not elsewhere classified: Secondary | ICD-10-CM | POA: Diagnosis not present

## 2017-12-18 DIAGNOSIS — I890 Lymphedema, not elsewhere classified: Secondary | ICD-10-CM | POA: Diagnosis not present

## 2017-12-18 DIAGNOSIS — L97812 Non-pressure chronic ulcer of other part of right lower leg with fat layer exposed: Secondary | ICD-10-CM | POA: Diagnosis not present

## 2017-12-18 DIAGNOSIS — I87313 Chronic venous hypertension (idiopathic) with ulcer of bilateral lower extremity: Secondary | ICD-10-CM | POA: Diagnosis not present

## 2017-12-18 DIAGNOSIS — E11622 Type 2 diabetes mellitus with other skin ulcer: Secondary | ICD-10-CM | POA: Diagnosis not present

## 2017-12-18 DIAGNOSIS — I872 Venous insufficiency (chronic) (peripheral): Secondary | ICD-10-CM | POA: Diagnosis not present

## 2017-12-18 DIAGNOSIS — L97822 Non-pressure chronic ulcer of other part of left lower leg with fat layer exposed: Secondary | ICD-10-CM | POA: Diagnosis not present

## 2017-12-21 DIAGNOSIS — J449 Chronic obstructive pulmonary disease, unspecified: Secondary | ICD-10-CM | POA: Diagnosis not present

## 2017-12-23 DIAGNOSIS — I480 Paroxysmal atrial fibrillation: Secondary | ICD-10-CM | POA: Diagnosis not present

## 2017-12-23 DIAGNOSIS — I10 Essential (primary) hypertension: Secondary | ICD-10-CM | POA: Diagnosis not present

## 2017-12-23 DIAGNOSIS — K5900 Constipation, unspecified: Secondary | ICD-10-CM | POA: Diagnosis not present

## 2017-12-23 DIAGNOSIS — I110 Hypertensive heart disease with heart failure: Secondary | ICD-10-CM | POA: Diagnosis not present

## 2017-12-23 DIAGNOSIS — Z743 Need for continuous supervision: Secondary | ICD-10-CM | POA: Diagnosis not present

## 2017-12-23 DIAGNOSIS — Z79899 Other long term (current) drug therapy: Secondary | ICD-10-CM | POA: Diagnosis not present

## 2017-12-23 DIAGNOSIS — R279 Unspecified lack of coordination: Secondary | ICD-10-CM | POA: Diagnosis not present

## 2017-12-23 DIAGNOSIS — L03116 Cellulitis of left lower limb: Secondary | ICD-10-CM | POA: Diagnosis not present

## 2017-12-23 DIAGNOSIS — I509 Heart failure, unspecified: Secondary | ICD-10-CM | POA: Diagnosis not present

## 2017-12-23 DIAGNOSIS — R110 Nausea: Secondary | ICD-10-CM | POA: Diagnosis not present

## 2017-12-23 DIAGNOSIS — R0902 Hypoxemia: Secondary | ICD-10-CM | POA: Diagnosis not present

## 2017-12-23 DIAGNOSIS — N179 Acute kidney failure, unspecified: Secondary | ICD-10-CM | POA: Diagnosis not present

## 2017-12-23 DIAGNOSIS — R278 Other lack of coordination: Secondary | ICD-10-CM | POA: Diagnosis not present

## 2017-12-23 DIAGNOSIS — Z7982 Long term (current) use of aspirin: Secondary | ICD-10-CM | POA: Diagnosis not present

## 2017-12-23 DIAGNOSIS — R0602 Shortness of breath: Secondary | ICD-10-CM | POA: Diagnosis not present

## 2017-12-23 DIAGNOSIS — Z794 Long term (current) use of insulin: Secondary | ICD-10-CM | POA: Diagnosis not present

## 2017-12-23 DIAGNOSIS — Z853 Personal history of malignant neoplasm of breast: Secondary | ICD-10-CM | POA: Diagnosis not present

## 2017-12-23 DIAGNOSIS — I83029 Varicose veins of left lower extremity with ulcer of unspecified site: Secondary | ICD-10-CM | POA: Diagnosis not present

## 2017-12-23 DIAGNOSIS — R531 Weakness: Secondary | ICD-10-CM | POA: Diagnosis not present

## 2017-12-23 DIAGNOSIS — Z9981 Dependence on supplemental oxygen: Secondary | ICD-10-CM | POA: Diagnosis not present

## 2017-12-23 DIAGNOSIS — R5383 Other fatigue: Secondary | ICD-10-CM | POA: Diagnosis not present

## 2017-12-23 DIAGNOSIS — E785 Hyperlipidemia, unspecified: Secondary | ICD-10-CM | POA: Diagnosis not present

## 2017-12-23 DIAGNOSIS — R2689 Other abnormalities of gait and mobility: Secondary | ICD-10-CM | POA: Diagnosis not present

## 2017-12-23 DIAGNOSIS — N183 Chronic kidney disease, stage 3 (moderate): Secondary | ICD-10-CM | POA: Diagnosis not present

## 2017-12-23 DIAGNOSIS — E1122 Type 2 diabetes mellitus with diabetic chronic kidney disease: Secondary | ICD-10-CM | POA: Diagnosis not present

## 2017-12-23 DIAGNOSIS — L97929 Non-pressure chronic ulcer of unspecified part of left lower leg with unspecified severity: Secondary | ICD-10-CM | POA: Diagnosis not present

## 2017-12-23 DIAGNOSIS — D72819 Decreased white blood cell count, unspecified: Secondary | ICD-10-CM | POA: Diagnosis not present

## 2017-12-23 DIAGNOSIS — I5032 Chronic diastolic (congestive) heart failure: Secondary | ICD-10-CM | POA: Diagnosis not present

## 2017-12-23 DIAGNOSIS — I5033 Acute on chronic diastolic (congestive) heart failure: Secondary | ICD-10-CM | POA: Diagnosis not present

## 2017-12-23 DIAGNOSIS — I130 Hypertensive heart and chronic kidney disease with heart failure and stage 1 through stage 4 chronic kidney disease, or unspecified chronic kidney disease: Secondary | ICD-10-CM | POA: Diagnosis not present

## 2017-12-23 DIAGNOSIS — J9621 Acute and chronic respiratory failure with hypoxia: Secondary | ICD-10-CM | POA: Diagnosis not present

## 2017-12-23 DIAGNOSIS — R55 Syncope and collapse: Secondary | ICD-10-CM | POA: Diagnosis not present

## 2017-12-23 DIAGNOSIS — M6281 Muscle weakness (generalized): Secondary | ICD-10-CM | POA: Diagnosis not present

## 2017-12-23 DIAGNOSIS — J9611 Chronic respiratory failure with hypoxia: Secondary | ICD-10-CM | POA: Diagnosis not present

## 2017-12-23 DIAGNOSIS — D696 Thrombocytopenia, unspecified: Secondary | ICD-10-CM | POA: Diagnosis not present

## 2017-12-23 DIAGNOSIS — E119 Type 2 diabetes mellitus without complications: Secondary | ICD-10-CM | POA: Diagnosis not present

## 2017-12-24 DIAGNOSIS — I509 Heart failure, unspecified: Secondary | ICD-10-CM

## 2017-12-28 DIAGNOSIS — R2689 Other abnormalities of gait and mobility: Secondary | ICD-10-CM | POA: Diagnosis not present

## 2017-12-28 DIAGNOSIS — R5383 Other fatigue: Secondary | ICD-10-CM | POA: Diagnosis not present

## 2017-12-28 DIAGNOSIS — R55 Syncope and collapse: Secondary | ICD-10-CM | POA: Diagnosis not present

## 2017-12-28 DIAGNOSIS — I5040 Unspecified combined systolic (congestive) and diastolic (congestive) heart failure: Secondary | ICD-10-CM | POA: Diagnosis not present

## 2017-12-28 DIAGNOSIS — E1159 Type 2 diabetes mellitus with other circulatory complications: Secondary | ICD-10-CM | POA: Diagnosis not present

## 2017-12-28 DIAGNOSIS — N179 Acute kidney failure, unspecified: Secondary | ICD-10-CM | POA: Diagnosis not present

## 2017-12-28 DIAGNOSIS — R279 Unspecified lack of coordination: Secondary | ICD-10-CM | POA: Diagnosis not present

## 2017-12-28 DIAGNOSIS — E785 Hyperlipidemia, unspecified: Secondary | ICD-10-CM | POA: Diagnosis not present

## 2017-12-28 DIAGNOSIS — Z9981 Dependence on supplemental oxygen: Secondary | ICD-10-CM | POA: Diagnosis not present

## 2017-12-28 DIAGNOSIS — N183 Chronic kidney disease, stage 3 (moderate): Secondary | ICD-10-CM | POA: Diagnosis not present

## 2017-12-28 DIAGNOSIS — I480 Paroxysmal atrial fibrillation: Secondary | ICD-10-CM | POA: Diagnosis not present

## 2017-12-28 DIAGNOSIS — Z743 Need for continuous supervision: Secondary | ICD-10-CM | POA: Diagnosis not present

## 2017-12-28 DIAGNOSIS — M6281 Muscle weakness (generalized): Secondary | ICD-10-CM | POA: Diagnosis not present

## 2017-12-28 DIAGNOSIS — J9621 Acute and chronic respiratory failure with hypoxia: Secondary | ICD-10-CM | POA: Diagnosis not present

## 2017-12-28 DIAGNOSIS — I10 Essential (primary) hypertension: Secondary | ICD-10-CM | POA: Diagnosis not present

## 2017-12-28 DIAGNOSIS — I509 Heart failure, unspecified: Secondary | ICD-10-CM | POA: Diagnosis not present

## 2017-12-28 DIAGNOSIS — R278 Other lack of coordination: Secondary | ICD-10-CM | POA: Diagnosis not present

## 2017-12-28 DIAGNOSIS — E119 Type 2 diabetes mellitus without complications: Secondary | ICD-10-CM | POA: Diagnosis not present

## 2017-12-28 DIAGNOSIS — R5381 Other malaise: Secondary | ICD-10-CM | POA: Diagnosis not present

## 2017-12-28 DIAGNOSIS — J9611 Chronic respiratory failure with hypoxia: Secondary | ICD-10-CM | POA: Diagnosis not present

## 2017-12-28 DIAGNOSIS — Z79899 Other long term (current) drug therapy: Secondary | ICD-10-CM | POA: Diagnosis not present

## 2017-12-28 DIAGNOSIS — I5032 Chronic diastolic (congestive) heart failure: Secondary | ICD-10-CM | POA: Diagnosis not present

## 2018-01-01 DIAGNOSIS — N179 Acute kidney failure, unspecified: Secondary | ICD-10-CM | POA: Diagnosis not present

## 2018-01-01 DIAGNOSIS — R5381 Other malaise: Secondary | ICD-10-CM | POA: Diagnosis not present

## 2018-01-01 DIAGNOSIS — E1159 Type 2 diabetes mellitus with other circulatory complications: Secondary | ICD-10-CM | POA: Diagnosis not present

## 2018-01-01 DIAGNOSIS — I509 Heart failure, unspecified: Secondary | ICD-10-CM | POA: Diagnosis not present

## 2018-01-02 DIAGNOSIS — I5040 Unspecified combined systolic (congestive) and diastolic (congestive) heart failure: Secondary | ICD-10-CM | POA: Diagnosis not present

## 2018-01-04 DIAGNOSIS — I509 Heart failure, unspecified: Secondary | ICD-10-CM | POA: Diagnosis not present

## 2018-01-04 DIAGNOSIS — J9611 Chronic respiratory failure with hypoxia: Secondary | ICD-10-CM | POA: Diagnosis not present

## 2018-01-04 DIAGNOSIS — N179 Acute kidney failure, unspecified: Secondary | ICD-10-CM | POA: Diagnosis not present

## 2018-01-04 DIAGNOSIS — Z9981 Dependence on supplemental oxygen: Secondary | ICD-10-CM | POA: Diagnosis not present

## 2018-01-08 DIAGNOSIS — I10 Essential (primary) hypertension: Secondary | ICD-10-CM | POA: Diagnosis not present

## 2018-01-08 DIAGNOSIS — E1159 Type 2 diabetes mellitus with other circulatory complications: Secondary | ICD-10-CM | POA: Diagnosis not present

## 2018-01-08 DIAGNOSIS — N179 Acute kidney failure, unspecified: Secondary | ICD-10-CM | POA: Diagnosis not present

## 2018-01-08 DIAGNOSIS — J9611 Chronic respiratory failure with hypoxia: Secondary | ICD-10-CM | POA: Diagnosis not present

## 2018-01-21 DIAGNOSIS — I10 Essential (primary) hypertension: Secondary | ICD-10-CM | POA: Diagnosis not present

## 2018-01-21 DIAGNOSIS — E7800 Pure hypercholesterolemia, unspecified: Secondary | ICD-10-CM | POA: Diagnosis not present

## 2018-01-21 DIAGNOSIS — J449 Chronic obstructive pulmonary disease, unspecified: Secondary | ICD-10-CM | POA: Diagnosis not present

## 2018-01-21 DIAGNOSIS — I5032 Chronic diastolic (congestive) heart failure: Secondary | ICD-10-CM | POA: Diagnosis not present

## 2018-01-21 DIAGNOSIS — Z23 Encounter for immunization: Secondary | ICD-10-CM | POA: Diagnosis not present

## 2018-01-21 DIAGNOSIS — E1122 Type 2 diabetes mellitus with diabetic chronic kidney disease: Secondary | ICD-10-CM | POA: Diagnosis not present

## 2018-01-22 DIAGNOSIS — N183 Chronic kidney disease, stage 3 (moderate): Secondary | ICD-10-CM | POA: Diagnosis not present

## 2018-01-22 DIAGNOSIS — Z7982 Long term (current) use of aspirin: Secondary | ICD-10-CM | POA: Diagnosis not present

## 2018-01-22 DIAGNOSIS — M6281 Muscle weakness (generalized): Secondary | ICD-10-CM | POA: Diagnosis not present

## 2018-01-22 DIAGNOSIS — Z9981 Dependence on supplemental oxygen: Secondary | ICD-10-CM | POA: Diagnosis not present

## 2018-01-22 DIAGNOSIS — I480 Paroxysmal atrial fibrillation: Secondary | ICD-10-CM | POA: Diagnosis not present

## 2018-01-22 DIAGNOSIS — E119 Type 2 diabetes mellitus without complications: Secondary | ICD-10-CM | POA: Diagnosis not present

## 2018-01-22 DIAGNOSIS — I130 Hypertensive heart and chronic kidney disease with heart failure and stage 1 through stage 4 chronic kidney disease, or unspecified chronic kidney disease: Secondary | ICD-10-CM | POA: Diagnosis not present

## 2018-01-22 DIAGNOSIS — Z794 Long term (current) use of insulin: Secondary | ICD-10-CM | POA: Diagnosis not present

## 2018-01-22 DIAGNOSIS — I5032 Chronic diastolic (congestive) heart failure: Secondary | ICD-10-CM | POA: Diagnosis not present

## 2018-01-22 DIAGNOSIS — I110 Hypertensive heart disease with heart failure: Secondary | ICD-10-CM | POA: Diagnosis not present

## 2018-01-22 DIAGNOSIS — L97822 Non-pressure chronic ulcer of other part of left lower leg with fat layer exposed: Secondary | ICD-10-CM | POA: Diagnosis not present

## 2018-01-22 DIAGNOSIS — K5900 Constipation, unspecified: Secondary | ICD-10-CM | POA: Diagnosis not present

## 2018-01-22 DIAGNOSIS — Z853 Personal history of malignant neoplasm of breast: Secondary | ICD-10-CM | POA: Diagnosis not present

## 2018-01-22 DIAGNOSIS — I890 Lymphedema, not elsewhere classified: Secondary | ICD-10-CM | POA: Diagnosis not present

## 2018-01-22 DIAGNOSIS — Z9181 History of falling: Secondary | ICD-10-CM | POA: Diagnosis not present

## 2018-01-22 DIAGNOSIS — E785 Hyperlipidemia, unspecified: Secondary | ICD-10-CM | POA: Diagnosis not present

## 2018-01-22 DIAGNOSIS — J45909 Unspecified asthma, uncomplicated: Secondary | ICD-10-CM | POA: Diagnosis not present

## 2018-01-22 DIAGNOSIS — J9611 Chronic respiratory failure with hypoxia: Secondary | ICD-10-CM | POA: Diagnosis not present

## 2018-01-22 DIAGNOSIS — M1990 Unspecified osteoarthritis, unspecified site: Secondary | ICD-10-CM | POA: Diagnosis not present

## 2018-01-22 DIAGNOSIS — I87312 Chronic venous hypertension (idiopathic) with ulcer of left lower extremity: Secondary | ICD-10-CM | POA: Diagnosis not present

## 2018-01-22 DIAGNOSIS — Z7951 Long term (current) use of inhaled steroids: Secondary | ICD-10-CM | POA: Diagnosis not present

## 2018-01-22 DIAGNOSIS — E1122 Type 2 diabetes mellitus with diabetic chronic kidney disease: Secondary | ICD-10-CM | POA: Diagnosis not present

## 2018-01-23 DIAGNOSIS — Z7951 Long term (current) use of inhaled steroids: Secondary | ICD-10-CM | POA: Diagnosis not present

## 2018-01-23 DIAGNOSIS — I130 Hypertensive heart and chronic kidney disease with heart failure and stage 1 through stage 4 chronic kidney disease, or unspecified chronic kidney disease: Secondary | ICD-10-CM | POA: Diagnosis not present

## 2018-01-23 DIAGNOSIS — E1122 Type 2 diabetes mellitus with diabetic chronic kidney disease: Secondary | ICD-10-CM | POA: Diagnosis not present

## 2018-01-23 DIAGNOSIS — N183 Chronic kidney disease, stage 3 (moderate): Secondary | ICD-10-CM | POA: Diagnosis not present

## 2018-01-23 DIAGNOSIS — K5900 Constipation, unspecified: Secondary | ICD-10-CM | POA: Diagnosis not present

## 2018-01-23 DIAGNOSIS — J9611 Chronic respiratory failure with hypoxia: Secondary | ICD-10-CM | POA: Diagnosis not present

## 2018-01-23 DIAGNOSIS — I480 Paroxysmal atrial fibrillation: Secondary | ICD-10-CM | POA: Diagnosis not present

## 2018-01-23 DIAGNOSIS — M6281 Muscle weakness (generalized): Secondary | ICD-10-CM | POA: Diagnosis not present

## 2018-01-23 DIAGNOSIS — Z794 Long term (current) use of insulin: Secondary | ICD-10-CM | POA: Diagnosis not present

## 2018-01-23 DIAGNOSIS — Z9181 History of falling: Secondary | ICD-10-CM | POA: Diagnosis not present

## 2018-01-23 DIAGNOSIS — E785 Hyperlipidemia, unspecified: Secondary | ICD-10-CM | POA: Diagnosis not present

## 2018-01-23 DIAGNOSIS — Z7982 Long term (current) use of aspirin: Secondary | ICD-10-CM | POA: Diagnosis not present

## 2018-01-23 DIAGNOSIS — Z9981 Dependence on supplemental oxygen: Secondary | ICD-10-CM | POA: Diagnosis not present

## 2018-01-23 DIAGNOSIS — Z853 Personal history of malignant neoplasm of breast: Secondary | ICD-10-CM | POA: Diagnosis not present

## 2018-01-23 DIAGNOSIS — I5032 Chronic diastolic (congestive) heart failure: Secondary | ICD-10-CM | POA: Diagnosis not present

## 2018-01-28 ENCOUNTER — Other Ambulatory Visit: Payer: Self-pay | Admitting: Internal Medicine

## 2018-01-29 DIAGNOSIS — I5032 Chronic diastolic (congestive) heart failure: Secondary | ICD-10-CM | POA: Diagnosis not present

## 2018-01-29 DIAGNOSIS — I130 Hypertensive heart and chronic kidney disease with heart failure and stage 1 through stage 4 chronic kidney disease, or unspecified chronic kidney disease: Secondary | ICD-10-CM | POA: Diagnosis not present

## 2018-01-29 DIAGNOSIS — R945 Abnormal results of liver function studies: Secondary | ICD-10-CM | POA: Diagnosis not present

## 2018-01-29 DIAGNOSIS — J9611 Chronic respiratory failure with hypoxia: Secondary | ICD-10-CM | POA: Diagnosis not present

## 2018-01-29 DIAGNOSIS — E1122 Type 2 diabetes mellitus with diabetic chronic kidney disease: Secondary | ICD-10-CM | POA: Diagnosis not present

## 2018-01-29 DIAGNOSIS — E785 Hyperlipidemia, unspecified: Secondary | ICD-10-CM | POA: Diagnosis not present

## 2018-01-29 DIAGNOSIS — Z794 Long term (current) use of insulin: Secondary | ICD-10-CM | POA: Diagnosis not present

## 2018-01-29 DIAGNOSIS — K5900 Constipation, unspecified: Secondary | ICD-10-CM | POA: Diagnosis not present

## 2018-01-29 DIAGNOSIS — L03114 Cellulitis of left upper limb: Secondary | ICD-10-CM | POA: Diagnosis not present

## 2018-01-29 DIAGNOSIS — N183 Chronic kidney disease, stage 3 (moderate): Secondary | ICD-10-CM | POA: Diagnosis not present

## 2018-01-29 DIAGNOSIS — M6281 Muscle weakness (generalized): Secondary | ICD-10-CM | POA: Diagnosis not present

## 2018-01-29 DIAGNOSIS — I480 Paroxysmal atrial fibrillation: Secondary | ICD-10-CM | POA: Diagnosis not present

## 2018-01-29 DIAGNOSIS — Z9181 History of falling: Secondary | ICD-10-CM | POA: Diagnosis not present

## 2018-01-29 DIAGNOSIS — Z853 Personal history of malignant neoplasm of breast: Secondary | ICD-10-CM | POA: Diagnosis not present

## 2018-01-29 DIAGNOSIS — Z7982 Long term (current) use of aspirin: Secondary | ICD-10-CM | POA: Diagnosis not present

## 2018-01-29 DIAGNOSIS — Z9981 Dependence on supplemental oxygen: Secondary | ICD-10-CM | POA: Diagnosis not present

## 2018-01-29 DIAGNOSIS — R21 Rash and other nonspecific skin eruption: Secondary | ICD-10-CM | POA: Diagnosis not present

## 2018-01-29 DIAGNOSIS — Z7951 Long term (current) use of inhaled steroids: Secondary | ICD-10-CM | POA: Diagnosis not present

## 2018-02-01 DIAGNOSIS — K5900 Constipation, unspecified: Secondary | ICD-10-CM | POA: Diagnosis not present

## 2018-02-01 DIAGNOSIS — I480 Paroxysmal atrial fibrillation: Secondary | ICD-10-CM | POA: Diagnosis not present

## 2018-02-01 DIAGNOSIS — I130 Hypertensive heart and chronic kidney disease with heart failure and stage 1 through stage 4 chronic kidney disease, or unspecified chronic kidney disease: Secondary | ICD-10-CM | POA: Diagnosis not present

## 2018-02-01 DIAGNOSIS — E1122 Type 2 diabetes mellitus with diabetic chronic kidney disease: Secondary | ICD-10-CM | POA: Diagnosis not present

## 2018-02-01 DIAGNOSIS — N183 Chronic kidney disease, stage 3 (moderate): Secondary | ICD-10-CM | POA: Diagnosis not present

## 2018-02-01 DIAGNOSIS — J9611 Chronic respiratory failure with hypoxia: Secondary | ICD-10-CM | POA: Diagnosis not present

## 2018-02-01 DIAGNOSIS — M6281 Muscle weakness (generalized): Secondary | ICD-10-CM | POA: Diagnosis not present

## 2018-02-01 DIAGNOSIS — I5032 Chronic diastolic (congestive) heart failure: Secondary | ICD-10-CM | POA: Diagnosis not present

## 2018-02-01 DIAGNOSIS — Z7951 Long term (current) use of inhaled steroids: Secondary | ICD-10-CM | POA: Diagnosis not present

## 2018-02-01 DIAGNOSIS — Z7982 Long term (current) use of aspirin: Secondary | ICD-10-CM | POA: Diagnosis not present

## 2018-02-01 DIAGNOSIS — Z9181 History of falling: Secondary | ICD-10-CM | POA: Diagnosis not present

## 2018-02-01 DIAGNOSIS — Z853 Personal history of malignant neoplasm of breast: Secondary | ICD-10-CM | POA: Diagnosis not present

## 2018-02-01 DIAGNOSIS — E785 Hyperlipidemia, unspecified: Secondary | ICD-10-CM | POA: Diagnosis not present

## 2018-02-01 DIAGNOSIS — Z9981 Dependence on supplemental oxygen: Secondary | ICD-10-CM | POA: Diagnosis not present

## 2018-02-01 DIAGNOSIS — Z794 Long term (current) use of insulin: Secondary | ICD-10-CM | POA: Diagnosis not present

## 2018-02-04 DIAGNOSIS — L97829 Non-pressure chronic ulcer of other part of left lower leg with unspecified severity: Secondary | ICD-10-CM | POA: Diagnosis not present

## 2018-02-04 DIAGNOSIS — I87312 Chronic venous hypertension (idiopathic) with ulcer of left lower extremity: Secondary | ICD-10-CM | POA: Diagnosis not present

## 2018-02-04 DIAGNOSIS — Z872 Personal history of diseases of the skin and subcutaneous tissue: Secondary | ICD-10-CM | POA: Diagnosis not present

## 2018-02-04 DIAGNOSIS — L03113 Cellulitis of right upper limb: Secondary | ICD-10-CM | POA: Diagnosis not present

## 2018-02-04 DIAGNOSIS — Z8679 Personal history of other diseases of the circulatory system: Secondary | ICD-10-CM | POA: Diagnosis not present

## 2018-02-04 DIAGNOSIS — I890 Lymphedema, not elsewhere classified: Secondary | ICD-10-CM | POA: Diagnosis not present

## 2018-02-04 DIAGNOSIS — Z09 Encounter for follow-up examination after completed treatment for conditions other than malignant neoplasm: Secondary | ICD-10-CM | POA: Diagnosis not present

## 2018-02-05 DIAGNOSIS — N183 Chronic kidney disease, stage 3 (moderate): Secondary | ICD-10-CM | POA: Diagnosis not present

## 2018-02-05 DIAGNOSIS — Z9981 Dependence on supplemental oxygen: Secondary | ICD-10-CM | POA: Diagnosis not present

## 2018-02-05 DIAGNOSIS — Z7951 Long term (current) use of inhaled steroids: Secondary | ICD-10-CM | POA: Diagnosis not present

## 2018-02-05 DIAGNOSIS — Z794 Long term (current) use of insulin: Secondary | ICD-10-CM | POA: Diagnosis not present

## 2018-02-05 DIAGNOSIS — J9611 Chronic respiratory failure with hypoxia: Secondary | ICD-10-CM | POA: Diagnosis not present

## 2018-02-05 DIAGNOSIS — Z853 Personal history of malignant neoplasm of breast: Secondary | ICD-10-CM | POA: Diagnosis not present

## 2018-02-05 DIAGNOSIS — I130 Hypertensive heart and chronic kidney disease with heart failure and stage 1 through stage 4 chronic kidney disease, or unspecified chronic kidney disease: Secondary | ICD-10-CM | POA: Diagnosis not present

## 2018-02-05 DIAGNOSIS — I5032 Chronic diastolic (congestive) heart failure: Secondary | ICD-10-CM | POA: Diagnosis not present

## 2018-02-05 DIAGNOSIS — E785 Hyperlipidemia, unspecified: Secondary | ICD-10-CM | POA: Diagnosis not present

## 2018-02-05 DIAGNOSIS — Z9181 History of falling: Secondary | ICD-10-CM | POA: Diagnosis not present

## 2018-02-05 DIAGNOSIS — Z7982 Long term (current) use of aspirin: Secondary | ICD-10-CM | POA: Diagnosis not present

## 2018-02-05 DIAGNOSIS — E1122 Type 2 diabetes mellitus with diabetic chronic kidney disease: Secondary | ICD-10-CM | POA: Diagnosis not present

## 2018-02-05 DIAGNOSIS — I480 Paroxysmal atrial fibrillation: Secondary | ICD-10-CM | POA: Diagnosis not present

## 2018-02-05 DIAGNOSIS — K5900 Constipation, unspecified: Secondary | ICD-10-CM | POA: Diagnosis not present

## 2018-02-05 DIAGNOSIS — M6281 Muscle weakness (generalized): Secondary | ICD-10-CM | POA: Diagnosis not present

## 2018-02-07 DIAGNOSIS — I5032 Chronic diastolic (congestive) heart failure: Secondary | ICD-10-CM | POA: Diagnosis not present

## 2018-02-07 DIAGNOSIS — E785 Hyperlipidemia, unspecified: Secondary | ICD-10-CM | POA: Diagnosis not present

## 2018-02-07 DIAGNOSIS — Z7982 Long term (current) use of aspirin: Secondary | ICD-10-CM | POA: Diagnosis not present

## 2018-02-07 DIAGNOSIS — J9690 Respiratory failure, unspecified, unspecified whether with hypoxia or hypercapnia: Secondary | ICD-10-CM | POA: Diagnosis not present

## 2018-02-07 DIAGNOSIS — Z7951 Long term (current) use of inhaled steroids: Secondary | ICD-10-CM | POA: Diagnosis not present

## 2018-02-07 DIAGNOSIS — N183 Chronic kidney disease, stage 3 (moderate): Secondary | ICD-10-CM | POA: Diagnosis not present

## 2018-02-07 DIAGNOSIS — Z853 Personal history of malignant neoplasm of breast: Secondary | ICD-10-CM | POA: Diagnosis not present

## 2018-02-07 DIAGNOSIS — E1122 Type 2 diabetes mellitus with diabetic chronic kidney disease: Secondary | ICD-10-CM | POA: Diagnosis not present

## 2018-02-07 DIAGNOSIS — I130 Hypertensive heart and chronic kidney disease with heart failure and stage 1 through stage 4 chronic kidney disease, or unspecified chronic kidney disease: Secondary | ICD-10-CM | POA: Diagnosis not present

## 2018-02-07 DIAGNOSIS — Z794 Long term (current) use of insulin: Secondary | ICD-10-CM | POA: Diagnosis not present

## 2018-02-07 DIAGNOSIS — Z9981 Dependence on supplemental oxygen: Secondary | ICD-10-CM | POA: Diagnosis not present

## 2018-02-07 DIAGNOSIS — I480 Paroxysmal atrial fibrillation: Secondary | ICD-10-CM | POA: Diagnosis not present

## 2018-02-07 DIAGNOSIS — K5900 Constipation, unspecified: Secondary | ICD-10-CM | POA: Diagnosis not present

## 2018-02-07 DIAGNOSIS — M6281 Muscle weakness (generalized): Secondary | ICD-10-CM | POA: Diagnosis not present

## 2018-02-07 DIAGNOSIS — Z9181 History of falling: Secondary | ICD-10-CM | POA: Diagnosis not present

## 2018-02-07 DIAGNOSIS — J9611 Chronic respiratory failure with hypoxia: Secondary | ICD-10-CM | POA: Diagnosis not present

## 2018-02-08 DIAGNOSIS — L989 Disorder of the skin and subcutaneous tissue, unspecified: Secondary | ICD-10-CM | POA: Insufficient documentation

## 2018-02-08 DIAGNOSIS — K5900 Constipation, unspecified: Secondary | ICD-10-CM | POA: Insufficient documentation

## 2018-02-08 DIAGNOSIS — Z9981 Dependence on supplemental oxygen: Secondary | ICD-10-CM | POA: Insufficient documentation

## 2018-02-08 DIAGNOSIS — R0681 Apnea, not elsewhere classified: Secondary | ICD-10-CM | POA: Insufficient documentation

## 2018-02-08 DIAGNOSIS — D0511 Intraductal carcinoma in situ of right breast: Secondary | ICD-10-CM | POA: Insufficient documentation

## 2018-02-08 DIAGNOSIS — M1990 Unspecified osteoarthritis, unspecified site: Secondary | ICD-10-CM | POA: Insufficient documentation

## 2018-02-11 ENCOUNTER — Encounter: Payer: Self-pay | Admitting: Podiatry

## 2018-02-11 ENCOUNTER — Ambulatory Visit (INDEPENDENT_AMBULATORY_CARE_PROVIDER_SITE_OTHER): Payer: Medicare Other | Admitting: Podiatry

## 2018-02-11 VITALS — BP 148/69 | HR 74 | Resp 16 | Ht 61.0 in | Wt 265.0 lb

## 2018-02-11 DIAGNOSIS — Z7982 Long term (current) use of aspirin: Secondary | ICD-10-CM

## 2018-02-11 DIAGNOSIS — M8589 Other specified disorders of bone density and structure, multiple sites: Secondary | ICD-10-CM | POA: Diagnosis not present

## 2018-02-11 DIAGNOSIS — Q828 Other specified congenital malformations of skin: Secondary | ICD-10-CM

## 2018-02-11 DIAGNOSIS — Z79811 Long term (current) use of aromatase inhibitors: Secondary | ICD-10-CM | POA: Diagnosis not present

## 2018-02-11 DIAGNOSIS — C50912 Malignant neoplasm of unspecified site of left female breast: Secondary | ICD-10-CM | POA: Diagnosis not present

## 2018-02-11 DIAGNOSIS — N189 Chronic kidney disease, unspecified: Secondary | ICD-10-CM

## 2018-02-11 DIAGNOSIS — E119 Type 2 diabetes mellitus without complications: Secondary | ICD-10-CM

## 2018-02-11 DIAGNOSIS — L97929 Non-pressure chronic ulcer of unspecified part of left lower leg with unspecified severity: Secondary | ICD-10-CM

## 2018-02-11 DIAGNOSIS — E1151 Type 2 diabetes mellitus with diabetic peripheral angiopathy without gangrene: Secondary | ICD-10-CM | POA: Diagnosis not present

## 2018-02-11 DIAGNOSIS — Z9221 Personal history of antineoplastic chemotherapy: Secondary | ICD-10-CM | POA: Diagnosis not present

## 2018-02-11 DIAGNOSIS — Z170 Estrogen receptor positive status [ER+]: Secondary | ICD-10-CM | POA: Diagnosis not present

## 2018-02-11 DIAGNOSIS — D649 Anemia, unspecified: Secondary | ICD-10-CM | POA: Diagnosis not present

## 2018-02-11 DIAGNOSIS — I878 Other specified disorders of veins: Secondary | ICD-10-CM

## 2018-02-11 DIAGNOSIS — I509 Heart failure, unspecified: Secondary | ICD-10-CM | POA: Diagnosis not present

## 2018-02-11 DIAGNOSIS — I83019 Varicose veins of right lower extremity with ulcer of unspecified site: Secondary | ICD-10-CM

## 2018-02-11 DIAGNOSIS — M8580 Other specified disorders of bone density and structure, unspecified site: Secondary | ICD-10-CM

## 2018-02-11 DIAGNOSIS — Z853 Personal history of malignant neoplasm of breast: Secondary | ICD-10-CM | POA: Diagnosis not present

## 2018-02-11 DIAGNOSIS — B351 Tinea unguium: Secondary | ICD-10-CM | POA: Diagnosis not present

## 2018-02-11 DIAGNOSIS — I83029 Varicose veins of left lower extremity with ulcer of unspecified site: Secondary | ICD-10-CM

## 2018-02-11 DIAGNOSIS — I4891 Unspecified atrial fibrillation: Secondary | ICD-10-CM

## 2018-02-11 DIAGNOSIS — D6489 Other specified anemias: Secondary | ICD-10-CM

## 2018-02-11 DIAGNOSIS — L97919 Non-pressure chronic ulcer of unspecified part of right lower leg with unspecified severity: Secondary | ICD-10-CM

## 2018-02-11 NOTE — Progress Notes (Signed)
Subjective:  Patient ID: Tammy Mathews, female    DOB: [birth year],  MRN: [id number]  No chief complaint on file.  67 y.o. female presents  for diabetic foot care. Last AMBS was 107. Unsure last A1c. PCP Dr. Burnett Sheng, last seen 1 week ago. Denies numbness and tingling in their feet. Denies cramping in legs and thighs.  Also complains of venous leg ulcers currently being treated at the wound care center.  Review of Systems: Negative except as noted in the HPI. Denies N/V/F/Ch.  Patient Active Problem List   Diagnosis Date Noted  . Apnea 02/08/2018  . Arthritis 02/08/2018  . Constipation 02/08/2018  . Ductal carcinoma in situ (DCIS) of right breast 02/08/2018  . Sore on leg 02/08/2018  . Requires supplemental oxygen 02/08/2018  . Diastolic dysfunction 00/76/2263  . Tricuspid regurgitation 05/30/2017  . Dyslipidemia 04/20/2017  . Cor pulmonale (chronic) ([place]) 04/06/2017  . Asthma 04/06/2017  . Morbid obesity due to excess calories ([place]) 04/06/2017  . Chronic respiratory failure with hypoxia and hypercapnia ([place]) 04/06/2017  . Shortness of breath 04/05/2017  . HTN (hypertension) 04/05/2017  . Persistent atrial fibrillation 12/06/2016  . Pulmonary hypertension ([place]) 12/06/2016  . Post-op pain 11/29/2015  . Adult BMI 45.0-49.9 kg/sq m ([place]) 10/29/2015  . Carcinoma of left breast metastatic to axillary lymph node ([place]) 10/29/2015  . Type 2 diabetes mellitus with complication ([place]) 33/54/5625  . H/O therapeutic radiation 10/29/2015  . OSA (obstructive sleep apnea) 10/29/2015  . Pulmonary emphysema ([place]) 10/29/2015    Current Outpatient Medications:  .  albuterol (PROVENTIL HFA;VENTOLIN HFA) 108 (90 Base) MCG/ACT inhaler, Inhale 2 puffs into the lungs every 4 (four) hours as needed for wheezing or shortness of breath. , Disp: , Rfl:  .  amiodarone (PACERONE) 200 MG tablet, Take 200 mg by mouth daily., Disp: , Rfl:  .  anastrozole (ARIMIDEX) 1 MG tablet, Take 1 mg by mouth daily.,  Disp: , Rfl:  .  aspirin EC 81 MG tablet, Take 81 mg by mouth daily., Disp: , Rfl:  .  atorvastatin (LIPITOR) 20 MG tablet, Take 20 mg by mouth daily., Disp: , Rfl:  .  bisoprolol (ZEBETA) 5 MG tablet, TAKE 1 TABLET BY MOUTH DAILY, Disp: 30 tablet, Rfl: 1 .  budesonide-formoterol (SYMBICORT) 80-4.5 MCG/ACT inhaler, Take 2 puffs first thing in am and then another 2 puffs about 12 hours later., Disp: 1 Inhaler, Rfl: 12 .  furosemide (LASIX) 20 MG tablet, 2 am and 1 pm, Disp: , Rfl:  .  insulin glargine (LANTUS) 100 UNIT/ML injection, Inject 50 Units into the skin at bedtime., Disp: , Rfl:  .  insulin lispro (HUMALOG) 100 UNIT/ML injection, Inject 10 Units into the skin every evening. , Disp: , Rfl:  .  ipratropium-albuterol (DUONEB) 0.5-2.5 (3) MG/3ML SOLN, Take 3 mLs by nebulization every 4 (four) hours as needed., Disp: , Rfl:  .  losartan (COZAAR) 100 MG tablet, Take 1 tablet (100 mg total) by mouth daily., Disp: 30 tablet, Rfl: 11 .  meclizine (ANTIVERT) 25 MG tablet, Take 25 mg by mouth every 6 (six) hours as needed for dizziness. , Disp: , Rfl:  .  OXYGEN, Oxygen 2lpm 24/7  A[facility], Disp: , Rfl:   Social History   Tobacco Use  Smoking Status Never Smoker  Smokeless Tobacco Never Used    Allergies  Allergen Reactions  . Codeine Nausea And Vomiting and Other (See Comments)    Unknown    Objective:  There were no vitals filed for  this visit. There is no height or weight on file to calculate BMI. Constitutional Well developed. Well nourished.  Vascular Dorsalis pedis pulses present 1+ bilaterally  Posterior tibial pulses absent bilaterally  Varicosities present bilat. Pedal hair growth absent. Capillary refill normal to all digits.  No cyanosis or clubbing noted.  Neurologic Normal speech. Oriented to person, place, and time. Epicritic sensation to light touch grossly present bilaterally. Protective sensation with 5.07 monofilament  present bilaterally.  Dermatologic Nails  elongated, thickened, dystrophic. No open wounds. HPK L 5th met base. Venous leg ulcers bilat.  Orthopedic: Normal joint ROM without pain or crepitus bilaterally. No visible deformities. No bony tenderness.   Assessment:   1. Encounter for diabetic foot exam ([place])   2. Diabetes mellitus type 2 with peripheral artery disease ([place])   3. Onychomycosis   4. Porokeratosis   5. Venous stasis   6. Venous ulcer of both lower extremities with varicose veins ([place])    Plan:  Patient was evaluated and treated and all questions answered.  Diabetes with PAD, Onychomycosis -Educated on diabetic footcare. Diabetic risk level 2 -Nails x10 debrided sharply and manually with large nail nipper and rotary burr.   Procedure: Nail Debridement Rationale: Patient meets criteria for routine foot care due to PAD Type of Debridement: manual, sharp debridement. Instrumentation: Nail nipper, rotary burr. Number of Nails: 10   Procedure: Paring of Lesion Rationale: painful hyperkeratotic lesion Type of Debridement: manual, sharp debridement. Instrumentation: 312 blade Number of Lesions: 1   Venous Leg Ulcers -Continue care at [facility].  Return in about 3 months (around 05/14/2018) for Diabetic Foot Care.

## 2018-02-11 NOTE — Patient Instructions (Signed)

## 2018-02-12 DIAGNOSIS — Z794 Long term (current) use of insulin: Secondary | ICD-10-CM | POA: Diagnosis not present

## 2018-02-12 DIAGNOSIS — Z7951 Long term (current) use of inhaled steroids: Secondary | ICD-10-CM | POA: Diagnosis not present

## 2018-02-12 DIAGNOSIS — M6281 Muscle weakness (generalized): Secondary | ICD-10-CM | POA: Diagnosis not present

## 2018-02-12 DIAGNOSIS — Z7982 Long term (current) use of aspirin: Secondary | ICD-10-CM | POA: Diagnosis not present

## 2018-02-12 DIAGNOSIS — I130 Hypertensive heart and chronic kidney disease with heart failure and stage 1 through stage 4 chronic kidney disease, or unspecified chronic kidney disease: Secondary | ICD-10-CM | POA: Diagnosis not present

## 2018-02-12 DIAGNOSIS — K5900 Constipation, unspecified: Secondary | ICD-10-CM | POA: Diagnosis not present

## 2018-02-12 DIAGNOSIS — I5032 Chronic diastolic (congestive) heart failure: Secondary | ICD-10-CM | POA: Diagnosis not present

## 2018-02-12 DIAGNOSIS — I480 Paroxysmal atrial fibrillation: Secondary | ICD-10-CM | POA: Diagnosis not present

## 2018-02-12 DIAGNOSIS — N183 Chronic kidney disease, stage 3 (moderate): Secondary | ICD-10-CM | POA: Diagnosis not present

## 2018-02-12 DIAGNOSIS — Z853 Personal history of malignant neoplasm of breast: Secondary | ICD-10-CM | POA: Diagnosis not present

## 2018-02-12 DIAGNOSIS — E1122 Type 2 diabetes mellitus with diabetic chronic kidney disease: Secondary | ICD-10-CM | POA: Diagnosis not present

## 2018-02-12 DIAGNOSIS — Z9181 History of falling: Secondary | ICD-10-CM | POA: Diagnosis not present

## 2018-02-12 DIAGNOSIS — J9611 Chronic respiratory failure with hypoxia: Secondary | ICD-10-CM | POA: Diagnosis not present

## 2018-02-12 DIAGNOSIS — Z9981 Dependence on supplemental oxygen: Secondary | ICD-10-CM | POA: Diagnosis not present

## 2018-02-12 DIAGNOSIS — E785 Hyperlipidemia, unspecified: Secondary | ICD-10-CM | POA: Diagnosis not present

## 2018-02-14 DIAGNOSIS — M6281 Muscle weakness (generalized): Secondary | ICD-10-CM | POA: Diagnosis not present

## 2018-02-14 DIAGNOSIS — Z7982 Long term (current) use of aspirin: Secondary | ICD-10-CM | POA: Diagnosis not present

## 2018-02-14 DIAGNOSIS — I5032 Chronic diastolic (congestive) heart failure: Secondary | ICD-10-CM | POA: Diagnosis not present

## 2018-02-14 DIAGNOSIS — Z853 Personal history of malignant neoplasm of breast: Secondary | ICD-10-CM | POA: Diagnosis not present

## 2018-02-14 DIAGNOSIS — N183 Chronic kidney disease, stage 3 (moderate): Secondary | ICD-10-CM | POA: Diagnosis not present

## 2018-02-14 DIAGNOSIS — Z9981 Dependence on supplemental oxygen: Secondary | ICD-10-CM | POA: Diagnosis not present

## 2018-02-14 DIAGNOSIS — I130 Hypertensive heart and chronic kidney disease with heart failure and stage 1 through stage 4 chronic kidney disease, or unspecified chronic kidney disease: Secondary | ICD-10-CM | POA: Diagnosis not present

## 2018-02-14 DIAGNOSIS — J9611 Chronic respiratory failure with hypoxia: Secondary | ICD-10-CM | POA: Diagnosis not present

## 2018-02-14 DIAGNOSIS — Z9181 History of falling: Secondary | ICD-10-CM | POA: Diagnosis not present

## 2018-02-14 DIAGNOSIS — Z7951 Long term (current) use of inhaled steroids: Secondary | ICD-10-CM | POA: Diagnosis not present

## 2018-02-14 DIAGNOSIS — K5900 Constipation, unspecified: Secondary | ICD-10-CM | POA: Diagnosis not present

## 2018-02-14 DIAGNOSIS — E1122 Type 2 diabetes mellitus with diabetic chronic kidney disease: Secondary | ICD-10-CM | POA: Diagnosis not present

## 2018-02-14 DIAGNOSIS — E785 Hyperlipidemia, unspecified: Secondary | ICD-10-CM | POA: Diagnosis not present

## 2018-02-14 DIAGNOSIS — I480 Paroxysmal atrial fibrillation: Secondary | ICD-10-CM | POA: Diagnosis not present

## 2018-02-14 DIAGNOSIS — Z794 Long term (current) use of insulin: Secondary | ICD-10-CM | POA: Diagnosis not present

## 2018-02-18 DIAGNOSIS — J9611 Chronic respiratory failure with hypoxia: Secondary | ICD-10-CM | POA: Diagnosis not present

## 2018-02-18 DIAGNOSIS — I130 Hypertensive heart and chronic kidney disease with heart failure and stage 1 through stage 4 chronic kidney disease, or unspecified chronic kidney disease: Secondary | ICD-10-CM | POA: Diagnosis not present

## 2018-02-26 ENCOUNTER — Ambulatory Visit: Payer: Medicare Other | Admitting: Orthotics

## 2018-02-26 DIAGNOSIS — I83019 Varicose veins of right lower extremity with ulcer of unspecified site: Secondary | ICD-10-CM

## 2018-02-26 DIAGNOSIS — I83029 Varicose veins of left lower extremity with ulcer of unspecified site: Secondary | ICD-10-CM

## 2018-02-26 DIAGNOSIS — I878 Other specified disorders of veins: Secondary | ICD-10-CM

## 2018-02-26 DIAGNOSIS — Q828 Other specified congenital malformations of skin: Secondary | ICD-10-CM

## 2018-02-26 DIAGNOSIS — L97919 Non-pressure chronic ulcer of unspecified part of right lower leg with unspecified severity: Secondary | ICD-10-CM

## 2018-02-26 DIAGNOSIS — E1151 Type 2 diabetes mellitus with diabetic peripheral angiopathy without gangrene: Secondary | ICD-10-CM

## 2018-02-26 DIAGNOSIS — E119 Type 2 diabetes mellitus without complications: Secondary | ICD-10-CM

## 2018-02-26 DIAGNOSIS — L97929 Non-pressure chronic ulcer of unspecified part of left lower leg with unspecified severity: Secondary | ICD-10-CM

## 2018-02-26 NOTE — Progress Notes (Signed)

## 2018-02-27 ENCOUNTER — Telehealth: Payer: Self-pay | Admitting: Podiatry

## 2018-02-27 DIAGNOSIS — I87312 Chronic venous hypertension (idiopathic) with ulcer of left lower extremity: Secondary | ICD-10-CM | POA: Diagnosis not present

## 2018-02-27 DIAGNOSIS — I5032 Chronic diastolic (congestive) heart failure: Secondary | ICD-10-CM | POA: Diagnosis not present

## 2018-02-27 DIAGNOSIS — I110 Hypertensive heart disease with heart failure: Secondary | ICD-10-CM | POA: Diagnosis not present

## 2018-02-27 DIAGNOSIS — I87302 Chronic venous hypertension (idiopathic) without complications of left lower extremity: Secondary | ICD-10-CM | POA: Diagnosis not present

## 2018-02-27 DIAGNOSIS — L97822 Non-pressure chronic ulcer of other part of left lower leg with fat layer exposed: Secondary | ICD-10-CM | POA: Diagnosis not present

## 2018-02-27 DIAGNOSIS — I890 Lymphedema, not elsewhere classified: Secondary | ICD-10-CM | POA: Diagnosis not present

## 2018-02-27 NOTE — Telephone Encounter (Signed)
Pt left message stating she was seen yesterday and Liliane Channel asked what size shoe did she wear and she was wearing a man's shoe. She said she wears a 10.5 W and Liliane Channel said something about a 10xw.

## 2018-03-06 DIAGNOSIS — Z8679 Personal history of other diseases of the circulatory system: Secondary | ICD-10-CM | POA: Diagnosis not present

## 2018-03-06 DIAGNOSIS — L97829 Non-pressure chronic ulcer of other part of left lower leg with unspecified severity: Secondary | ICD-10-CM | POA: Diagnosis not present

## 2018-03-06 DIAGNOSIS — Z09 Encounter for follow-up examination after completed treatment for conditions other than malignant neoplasm: Secondary | ICD-10-CM | POA: Diagnosis not present

## 2018-03-06 DIAGNOSIS — I87312 Chronic venous hypertension (idiopathic) with ulcer of left lower extremity: Secondary | ICD-10-CM | POA: Diagnosis not present

## 2018-03-06 DIAGNOSIS — I890 Lymphedema, not elsewhere classified: Secondary | ICD-10-CM | POA: Diagnosis not present

## 2018-03-10 DIAGNOSIS — J9690 Respiratory failure, unspecified, unspecified whether with hypoxia or hypercapnia: Secondary | ICD-10-CM | POA: Diagnosis not present

## 2018-03-14 ENCOUNTER — Other Ambulatory Visit: Payer: Medicare Other

## 2018-04-09 ENCOUNTER — Other Ambulatory Visit: Payer: Self-pay | Admitting: Internal Medicine

## 2018-04-09 DIAGNOSIS — J9690 Respiratory failure, unspecified, unspecified whether with hypoxia or hypercapnia: Secondary | ICD-10-CM | POA: Diagnosis not present

## 2018-04-09 DIAGNOSIS — E119 Type 2 diabetes mellitus without complications: Secondary | ICD-10-CM | POA: Diagnosis not present

## 2018-04-09 DIAGNOSIS — J45909 Unspecified asthma, uncomplicated: Secondary | ICD-10-CM | POA: Diagnosis not present

## 2018-04-09 DIAGNOSIS — L97822 Non-pressure chronic ulcer of other part of left lower leg with fat layer exposed: Secondary | ICD-10-CM | POA: Diagnosis not present

## 2018-04-09 DIAGNOSIS — L97812 Non-pressure chronic ulcer of other part of right lower leg with fat layer exposed: Secondary | ICD-10-CM | POA: Diagnosis not present

## 2018-04-09 DIAGNOSIS — I110 Hypertensive heart disease with heart failure: Secondary | ICD-10-CM | POA: Diagnosis not present

## 2018-04-09 DIAGNOSIS — Z794 Long term (current) use of insulin: Secondary | ICD-10-CM | POA: Diagnosis not present

## 2018-04-09 DIAGNOSIS — C7981 Secondary malignant neoplasm of breast: Secondary | ICD-10-CM | POA: Diagnosis not present

## 2018-04-09 DIAGNOSIS — I509 Heart failure, unspecified: Secondary | ICD-10-CM | POA: Diagnosis not present

## 2018-04-09 DIAGNOSIS — M1990 Unspecified osteoarthritis, unspecified site: Secondary | ICD-10-CM | POA: Diagnosis not present

## 2018-04-09 DIAGNOSIS — I890 Lymphedema, not elsewhere classified: Secondary | ICD-10-CM | POA: Diagnosis not present

## 2018-04-09 DIAGNOSIS — I87313 Chronic venous hypertension (idiopathic) with ulcer of bilateral lower extremity: Secondary | ICD-10-CM | POA: Diagnosis not present

## 2018-04-15 DIAGNOSIS — R069 Unspecified abnormalities of breathing: Secondary | ICD-10-CM | POA: Diagnosis not present

## 2018-04-15 DIAGNOSIS — J9811 Atelectasis: Secondary | ICD-10-CM | POA: Diagnosis not present

## 2018-04-15 DIAGNOSIS — R262 Difficulty in walking, not elsewhere classified: Secondary | ICD-10-CM | POA: Diagnosis not present

## 2018-04-15 DIAGNOSIS — I110 Hypertensive heart disease with heart failure: Secondary | ICD-10-CM | POA: Diagnosis not present

## 2018-04-15 DIAGNOSIS — L97909 Non-pressure chronic ulcer of unspecified part of unspecified lower leg with unspecified severity: Secondary | ICD-10-CM | POA: Diagnosis not present

## 2018-04-15 DIAGNOSIS — R52 Pain, unspecified: Secondary | ICD-10-CM | POA: Diagnosis not present

## 2018-04-15 DIAGNOSIS — M25562 Pain in left knee: Secondary | ICD-10-CM | POA: Diagnosis not present

## 2018-04-15 DIAGNOSIS — I5033 Acute on chronic diastolic (congestive) heart failure: Secondary | ICD-10-CM | POA: Diagnosis not present

## 2018-04-15 DIAGNOSIS — W19XXXA Unspecified fall, initial encounter: Secondary | ICD-10-CM | POA: Diagnosis not present

## 2018-04-15 DIAGNOSIS — R0602 Shortness of breath: Secondary | ICD-10-CM | POA: Diagnosis not present

## 2018-04-15 DIAGNOSIS — N179 Acute kidney failure, unspecified: Secondary | ICD-10-CM | POA: Diagnosis not present

## 2018-04-15 DIAGNOSIS — I10 Essential (primary) hypertension: Secondary | ICD-10-CM | POA: Diagnosis not present

## 2018-04-15 DIAGNOSIS — Z743 Need for continuous supervision: Secondary | ICD-10-CM | POA: Diagnosis not present

## 2018-04-15 DIAGNOSIS — S8002XA Contusion of left knee, initial encounter: Secondary | ICD-10-CM | POA: Diagnosis not present

## 2018-04-16 DIAGNOSIS — Z23 Encounter for immunization: Secondary | ICD-10-CM | POA: Diagnosis not present

## 2018-04-16 DIAGNOSIS — S83242A Other tear of medial meniscus, current injury, left knee, initial encounter: Secondary | ICD-10-CM | POA: Diagnosis not present

## 2018-04-16 DIAGNOSIS — R2681 Unsteadiness on feet: Secondary | ICD-10-CM | POA: Diagnosis not present

## 2018-04-16 DIAGNOSIS — M1990 Unspecified osteoarthritis, unspecified site: Secondary | ICD-10-CM | POA: Diagnosis not present

## 2018-04-16 DIAGNOSIS — R278 Other lack of coordination: Secondary | ICD-10-CM | POA: Diagnosis not present

## 2018-04-16 DIAGNOSIS — M25462 Effusion, left knee: Secondary | ICD-10-CM | POA: Diagnosis not present

## 2018-04-16 DIAGNOSIS — R197 Diarrhea, unspecified: Secondary | ICD-10-CM | POA: Diagnosis not present

## 2018-04-16 DIAGNOSIS — L97909 Non-pressure chronic ulcer of unspecified part of unspecified lower leg with unspecified severity: Secondary | ICD-10-CM | POA: Diagnosis not present

## 2018-04-16 DIAGNOSIS — Z885 Allergy status to narcotic agent status: Secondary | ICD-10-CM | POA: Diagnosis not present

## 2018-04-16 DIAGNOSIS — Z79899 Other long term (current) drug therapy: Secondary | ICD-10-CM | POA: Diagnosis not present

## 2018-04-16 DIAGNOSIS — I5033 Acute on chronic diastolic (congestive) heart failure: Secondary | ICD-10-CM | POA: Diagnosis not present

## 2018-04-16 DIAGNOSIS — Z9981 Dependence on supplemental oxygen: Secondary | ICD-10-CM | POA: Diagnosis not present

## 2018-04-16 DIAGNOSIS — E7800 Pure hypercholesterolemia, unspecified: Secondary | ICD-10-CM | POA: Diagnosis not present

## 2018-04-16 DIAGNOSIS — I872 Venous insufficiency (chronic) (peripheral): Secondary | ICD-10-CM | POA: Diagnosis not present

## 2018-04-16 DIAGNOSIS — R2689 Other abnormalities of gait and mobility: Secondary | ICD-10-CM | POA: Diagnosis not present

## 2018-04-16 DIAGNOSIS — E119 Type 2 diabetes mellitus without complications: Secondary | ICD-10-CM | POA: Diagnosis not present

## 2018-04-16 DIAGNOSIS — I110 Hypertensive heart disease with heart failure: Secondary | ICD-10-CM | POA: Diagnosis not present

## 2018-04-16 DIAGNOSIS — S83242D Other tear of medial meniscus, current injury, left knee, subsequent encounter: Secondary | ICD-10-CM | POA: Diagnosis not present

## 2018-04-16 DIAGNOSIS — I83009 Varicose veins of unspecified lower extremity with ulcer of unspecified site: Secondary | ICD-10-CM | POA: Diagnosis not present

## 2018-04-16 DIAGNOSIS — R262 Difficulty in walking, not elsewhere classified: Secondary | ICD-10-CM | POA: Diagnosis not present

## 2018-04-16 DIAGNOSIS — J9811 Atelectasis: Secondary | ICD-10-CM | POA: Diagnosis not present

## 2018-04-16 DIAGNOSIS — R0602 Shortness of breath: Secondary | ICD-10-CM | POA: Diagnosis not present

## 2018-04-16 DIAGNOSIS — R5383 Other fatigue: Secondary | ICD-10-CM | POA: Diagnosis not present

## 2018-04-16 DIAGNOSIS — Z7401 Bed confinement status: Secondary | ICD-10-CM | POA: Diagnosis not present

## 2018-04-16 DIAGNOSIS — I480 Paroxysmal atrial fibrillation: Secondary | ICD-10-CM | POA: Diagnosis not present

## 2018-04-16 DIAGNOSIS — M7122 Synovial cyst of popliteal space [Baker], left knee: Secondary | ICD-10-CM | POA: Diagnosis not present

## 2018-04-16 DIAGNOSIS — N179 Acute kidney failure, unspecified: Secondary | ICD-10-CM | POA: Diagnosis not present

## 2018-04-16 DIAGNOSIS — Z794 Long term (current) use of insulin: Secondary | ICD-10-CM | POA: Diagnosis not present

## 2018-04-16 DIAGNOSIS — J449 Chronic obstructive pulmonary disease, unspecified: Secondary | ICD-10-CM | POA: Diagnosis not present

## 2018-04-16 DIAGNOSIS — M2550 Pain in unspecified joint: Secondary | ICD-10-CM | POA: Diagnosis not present

## 2018-04-16 DIAGNOSIS — Z853 Personal history of malignant neoplasm of breast: Secondary | ICD-10-CM | POA: Diagnosis not present

## 2018-04-16 DIAGNOSIS — T148XXD Other injury of unspecified body region, subsequent encounter: Secondary | ICD-10-CM | POA: Diagnosis not present

## 2018-04-16 DIAGNOSIS — I959 Hypotension, unspecified: Secondary | ICD-10-CM | POA: Diagnosis not present

## 2018-04-16 DIAGNOSIS — Z7982 Long term (current) use of aspirin: Secondary | ICD-10-CM | POA: Diagnosis not present

## 2018-04-16 DIAGNOSIS — M25562 Pain in left knee: Secondary | ICD-10-CM | POA: Diagnosis not present

## 2018-04-16 DIAGNOSIS — Z888 Allergy status to other drugs, medicaments and biological substances status: Secondary | ICD-10-CM | POA: Diagnosis not present

## 2018-04-16 DIAGNOSIS — S8002XA Contusion of left knee, initial encounter: Secondary | ICD-10-CM | POA: Diagnosis not present

## 2018-04-16 DIAGNOSIS — I5032 Chronic diastolic (congestive) heart failure: Secondary | ICD-10-CM | POA: Diagnosis not present

## 2018-04-16 DIAGNOSIS — M7981 Nontraumatic hematoma of soft tissue: Secondary | ICD-10-CM | POA: Diagnosis not present

## 2018-04-19 DIAGNOSIS — R2681 Unsteadiness on feet: Secondary | ICD-10-CM | POA: Diagnosis not present

## 2018-04-19 DIAGNOSIS — I5032 Chronic diastolic (congestive) heart failure: Secondary | ICD-10-CM | POA: Diagnosis not present

## 2018-04-19 DIAGNOSIS — M1990 Unspecified osteoarthritis, unspecified site: Secondary | ICD-10-CM | POA: Diagnosis not present

## 2018-04-19 DIAGNOSIS — I872 Venous insufficiency (chronic) (peripheral): Secondary | ICD-10-CM | POA: Diagnosis not present

## 2018-04-19 DIAGNOSIS — R0989 Other specified symptoms and signs involving the circulatory and respiratory systems: Secondary | ICD-10-CM | POA: Diagnosis not present

## 2018-04-19 DIAGNOSIS — Z888 Allergy status to other drugs, medicaments and biological substances status: Secondary | ICD-10-CM | POA: Diagnosis not present

## 2018-04-19 DIAGNOSIS — I5033 Acute on chronic diastolic (congestive) heart failure: Secondary | ICD-10-CM | POA: Diagnosis not present

## 2018-04-19 DIAGNOSIS — Z23 Encounter for immunization: Secondary | ICD-10-CM | POA: Diagnosis not present

## 2018-04-19 DIAGNOSIS — I480 Paroxysmal atrial fibrillation: Secondary | ICD-10-CM | POA: Diagnosis not present

## 2018-04-19 DIAGNOSIS — T148XXD Other injury of unspecified body region, subsequent encounter: Secondary | ICD-10-CM | POA: Diagnosis not present

## 2018-04-19 DIAGNOSIS — Z9981 Dependence on supplemental oxygen: Secondary | ICD-10-CM | POA: Diagnosis not present

## 2018-04-19 DIAGNOSIS — J449 Chronic obstructive pulmonary disease, unspecified: Secondary | ICD-10-CM | POA: Diagnosis not present

## 2018-04-19 DIAGNOSIS — R278 Other lack of coordination: Secondary | ICD-10-CM | POA: Diagnosis not present

## 2018-04-19 DIAGNOSIS — Z7982 Long term (current) use of aspirin: Secondary | ICD-10-CM | POA: Diagnosis not present

## 2018-04-19 DIAGNOSIS — E7800 Pure hypercholesterolemia, unspecified: Secondary | ICD-10-CM | POA: Diagnosis not present

## 2018-04-19 DIAGNOSIS — I110 Hypertensive heart disease with heart failure: Secondary | ICD-10-CM | POA: Diagnosis not present

## 2018-04-19 DIAGNOSIS — J9611 Chronic respiratory failure with hypoxia: Secondary | ICD-10-CM | POA: Diagnosis not present

## 2018-04-19 DIAGNOSIS — M25462 Effusion, left knee: Secondary | ICD-10-CM | POA: Diagnosis not present

## 2018-04-19 DIAGNOSIS — E119 Type 2 diabetes mellitus without complications: Secondary | ICD-10-CM | POA: Diagnosis not present

## 2018-04-19 DIAGNOSIS — S83242D Other tear of medial meniscus, current injury, left knee, subsequent encounter: Secondary | ICD-10-CM | POA: Diagnosis not present

## 2018-04-19 DIAGNOSIS — S8002XA Contusion of left knee, initial encounter: Secondary | ICD-10-CM | POA: Diagnosis not present

## 2018-04-19 DIAGNOSIS — L97909 Non-pressure chronic ulcer of unspecified part of unspecified lower leg with unspecified severity: Secondary | ICD-10-CM | POA: Diagnosis not present

## 2018-04-19 DIAGNOSIS — M2550 Pain in unspecified joint: Secondary | ICD-10-CM | POA: Diagnosis not present

## 2018-04-19 DIAGNOSIS — K5900 Constipation, unspecified: Secondary | ICD-10-CM | POA: Diagnosis not present

## 2018-04-19 DIAGNOSIS — R0602 Shortness of breath: Secondary | ICD-10-CM | POA: Diagnosis not present

## 2018-04-19 DIAGNOSIS — M25562 Pain in left knee: Secondary | ICD-10-CM | POA: Diagnosis not present

## 2018-04-19 DIAGNOSIS — R52 Pain, unspecified: Secondary | ICD-10-CM | POA: Diagnosis not present

## 2018-04-19 DIAGNOSIS — Z79899 Other long term (current) drug therapy: Secondary | ICD-10-CM | POA: Diagnosis not present

## 2018-04-19 DIAGNOSIS — I509 Heart failure, unspecified: Secondary | ICD-10-CM | POA: Diagnosis not present

## 2018-04-19 DIAGNOSIS — Z794 Long term (current) use of insulin: Secondary | ICD-10-CM | POA: Diagnosis not present

## 2018-04-19 DIAGNOSIS — R5383 Other fatigue: Secondary | ICD-10-CM | POA: Diagnosis not present

## 2018-04-19 DIAGNOSIS — M7122 Synovial cyst of popliteal space [Baker], left knee: Secondary | ICD-10-CM | POA: Diagnosis not present

## 2018-04-19 DIAGNOSIS — Z7401 Bed confinement status: Secondary | ICD-10-CM | POA: Diagnosis not present

## 2018-04-19 DIAGNOSIS — Z853 Personal history of malignant neoplasm of breast: Secondary | ICD-10-CM | POA: Diagnosis not present

## 2018-04-19 DIAGNOSIS — R5381 Other malaise: Secondary | ICD-10-CM | POA: Diagnosis not present

## 2018-04-19 DIAGNOSIS — S83242A Other tear of medial meniscus, current injury, left knee, initial encounter: Secondary | ICD-10-CM | POA: Diagnosis not present

## 2018-04-19 DIAGNOSIS — Z885 Allergy status to narcotic agent status: Secondary | ICD-10-CM | POA: Diagnosis not present

## 2018-04-19 DIAGNOSIS — N179 Acute kidney failure, unspecified: Secondary | ICD-10-CM | POA: Diagnosis not present

## 2018-04-19 DIAGNOSIS — I959 Hypotension, unspecified: Secondary | ICD-10-CM | POA: Diagnosis not present

## 2018-04-19 DIAGNOSIS — I83009 Varicose veins of unspecified lower extremity with ulcer of unspecified site: Secondary | ICD-10-CM | POA: Diagnosis not present

## 2018-04-19 DIAGNOSIS — S83249A Other tear of medial meniscus, current injury, unspecified knee, initial encounter: Secondary | ICD-10-CM | POA: Diagnosis not present

## 2018-04-19 DIAGNOSIS — R197 Diarrhea, unspecified: Secondary | ICD-10-CM | POA: Diagnosis not present

## 2018-04-19 DIAGNOSIS — S8002XS Contusion of left knee, sequela: Secondary | ICD-10-CM | POA: Diagnosis not present

## 2018-04-19 DIAGNOSIS — R2689 Other abnormalities of gait and mobility: Secondary | ICD-10-CM | POA: Diagnosis not present

## 2018-04-23 DIAGNOSIS — I509 Heart failure, unspecified: Secondary | ICD-10-CM | POA: Diagnosis not present

## 2018-04-23 DIAGNOSIS — S8002XS Contusion of left knee, sequela: Secondary | ICD-10-CM | POA: Diagnosis not present

## 2018-04-23 DIAGNOSIS — S83249A Other tear of medial meniscus, current injury, unspecified knee, initial encounter: Secondary | ICD-10-CM | POA: Diagnosis not present

## 2018-04-23 DIAGNOSIS — J9611 Chronic respiratory failure with hypoxia: Secondary | ICD-10-CM | POA: Diagnosis not present

## 2018-04-24 DIAGNOSIS — R0989 Other specified symptoms and signs involving the circulatory and respiratory systems: Secondary | ICD-10-CM | POA: Diagnosis not present

## 2018-05-07 DIAGNOSIS — R5381 Other malaise: Secondary | ICD-10-CM | POA: Diagnosis not present

## 2018-05-07 DIAGNOSIS — J9611 Chronic respiratory failure with hypoxia: Secondary | ICD-10-CM | POA: Diagnosis not present

## 2018-05-07 DIAGNOSIS — K5900 Constipation, unspecified: Secondary | ICD-10-CM | POA: Diagnosis not present

## 2018-05-07 DIAGNOSIS — S83249A Other tear of medial meniscus, current injury, unspecified knee, initial encounter: Secondary | ICD-10-CM | POA: Diagnosis not present

## 2018-05-08 DIAGNOSIS — Z7401 Bed confinement status: Secondary | ICD-10-CM | POA: Diagnosis not present

## 2018-05-08 DIAGNOSIS — J9601 Acute respiratory failure with hypoxia: Secondary | ICD-10-CM | POA: Diagnosis not present

## 2018-05-08 DIAGNOSIS — R262 Difficulty in walking, not elsewhere classified: Secondary | ICD-10-CM | POA: Diagnosis not present

## 2018-05-08 DIAGNOSIS — N183 Chronic kidney disease, stage 3 (moderate): Secondary | ICD-10-CM | POA: Diagnosis not present

## 2018-05-08 DIAGNOSIS — I878 Other specified disorders of veins: Secondary | ICD-10-CM | POA: Diagnosis not present

## 2018-05-08 DIAGNOSIS — M6281 Muscle weakness (generalized): Secondary | ICD-10-CM | POA: Diagnosis not present

## 2018-05-08 DIAGNOSIS — N189 Chronic kidney disease, unspecified: Secondary | ICD-10-CM | POA: Diagnosis not present

## 2018-05-08 DIAGNOSIS — R6520 Severe sepsis without septic shock: Secondary | ICD-10-CM | POA: Diagnosis not present

## 2018-05-08 DIAGNOSIS — J449 Chronic obstructive pulmonary disease, unspecified: Secondary | ICD-10-CM | POA: Diagnosis not present

## 2018-05-08 DIAGNOSIS — C50919 Malignant neoplasm of unspecified site of unspecified female breast: Secondary | ICD-10-CM | POA: Diagnosis not present

## 2018-05-08 DIAGNOSIS — F419 Anxiety disorder, unspecified: Secondary | ICD-10-CM | POA: Diagnosis not present

## 2018-05-08 DIAGNOSIS — J9621 Acute and chronic respiratory failure with hypoxia: Secondary | ICD-10-CM | POA: Diagnosis not present

## 2018-05-08 DIAGNOSIS — Z170 Estrogen receptor positive status [ER+]: Secondary | ICD-10-CM | POA: Diagnosis not present

## 2018-05-08 DIAGNOSIS — I83009 Varicose veins of unspecified lower extremity with ulcer of unspecified site: Secondary | ICD-10-CM | POA: Diagnosis not present

## 2018-05-08 DIAGNOSIS — R5381 Other malaise: Secondary | ICD-10-CM | POA: Diagnosis not present

## 2018-05-08 DIAGNOSIS — R278 Other lack of coordination: Secondary | ICD-10-CM | POA: Diagnosis not present

## 2018-05-08 DIAGNOSIS — D649 Anemia, unspecified: Secondary | ICD-10-CM | POA: Diagnosis not present

## 2018-05-08 DIAGNOSIS — N170 Acute kidney failure with tubular necrosis: Secondary | ICD-10-CM | POA: Diagnosis not present

## 2018-05-08 DIAGNOSIS — L97909 Non-pressure chronic ulcer of unspecified part of unspecified lower leg with unspecified severity: Secondary | ICD-10-CM | POA: Diagnosis not present

## 2018-05-08 DIAGNOSIS — R918 Other nonspecific abnormal finding of lung field: Secondary | ICD-10-CM | POA: Diagnosis not present

## 2018-05-08 DIAGNOSIS — N179 Acute kidney failure, unspecified: Secondary | ICD-10-CM | POA: Diagnosis not present

## 2018-05-08 DIAGNOSIS — L304 Erythema intertrigo: Secondary | ICD-10-CM | POA: Diagnosis not present

## 2018-05-08 DIAGNOSIS — Z9981 Dependence on supplemental oxygen: Secondary | ICD-10-CM | POA: Diagnosis not present

## 2018-05-08 DIAGNOSIS — I361 Nonrheumatic tricuspid (valve) insufficiency: Secondary | ICD-10-CM | POA: Diagnosis not present

## 2018-05-08 DIAGNOSIS — Z794 Long term (current) use of insulin: Secondary | ICD-10-CM | POA: Diagnosis not present

## 2018-05-08 DIAGNOSIS — I959 Hypotension, unspecified: Secondary | ICD-10-CM | POA: Diagnosis not present

## 2018-05-08 DIAGNOSIS — E7800 Pure hypercholesterolemia, unspecified: Secondary | ICD-10-CM | POA: Diagnosis not present

## 2018-05-08 DIAGNOSIS — E785 Hyperlipidemia, unspecified: Secondary | ICD-10-CM | POA: Diagnosis not present

## 2018-05-08 DIAGNOSIS — Z741 Need for assistance with personal care: Secondary | ICD-10-CM | POA: Diagnosis not present

## 2018-05-08 DIAGNOSIS — I272 Pulmonary hypertension, unspecified: Secondary | ICD-10-CM | POA: Diagnosis not present

## 2018-05-08 DIAGNOSIS — A4151 Sepsis due to Escherichia coli [E. coli]: Secondary | ICD-10-CM | POA: Diagnosis not present

## 2018-05-08 DIAGNOSIS — A419 Sepsis, unspecified organism: Secondary | ICD-10-CM | POA: Diagnosis not present

## 2018-05-08 DIAGNOSIS — R0600 Dyspnea, unspecified: Secondary | ICD-10-CM | POA: Diagnosis not present

## 2018-05-08 DIAGNOSIS — R601 Generalized edema: Secondary | ICD-10-CM | POA: Diagnosis not present

## 2018-05-08 DIAGNOSIS — R0902 Hypoxemia: Secondary | ICD-10-CM | POA: Diagnosis not present

## 2018-05-08 DIAGNOSIS — E119 Type 2 diabetes mellitus without complications: Secondary | ICD-10-CM | POA: Diagnosis not present

## 2018-05-08 DIAGNOSIS — M25062 Hemarthrosis, left knee: Secondary | ICD-10-CM | POA: Diagnosis not present

## 2018-05-08 DIAGNOSIS — J9690 Respiratory failure, unspecified, unspecified whether with hypoxia or hypercapnia: Secondary | ICD-10-CM | POA: Diagnosis not present

## 2018-05-08 DIAGNOSIS — R0689 Other abnormalities of breathing: Secondary | ICD-10-CM | POA: Diagnosis not present

## 2018-05-08 DIAGNOSIS — I10 Essential (primary) hypertension: Secondary | ICD-10-CM | POA: Diagnosis not present

## 2018-05-08 DIAGNOSIS — I110 Hypertensive heart disease with heart failure: Secondary | ICD-10-CM | POA: Diagnosis not present

## 2018-05-08 DIAGNOSIS — E1165 Type 2 diabetes mellitus with hyperglycemia: Secondary | ICD-10-CM | POA: Diagnosis not present

## 2018-05-08 DIAGNOSIS — I509 Heart failure, unspecified: Secondary | ICD-10-CM | POA: Diagnosis not present

## 2018-05-08 DIAGNOSIS — N19 Unspecified kidney failure: Secondary | ICD-10-CM | POA: Diagnosis not present

## 2018-05-08 DIAGNOSIS — M2550 Pain in unspecified joint: Secondary | ICD-10-CM | POA: Diagnosis not present

## 2018-05-08 DIAGNOSIS — J80 Acute respiratory distress syndrome: Secondary | ICD-10-CM | POA: Diagnosis not present

## 2018-05-08 DIAGNOSIS — R279 Unspecified lack of coordination: Secondary | ICD-10-CM | POA: Diagnosis not present

## 2018-05-08 DIAGNOSIS — N3000 Acute cystitis without hematuria: Secondary | ICD-10-CM | POA: Diagnosis not present

## 2018-05-08 DIAGNOSIS — B379 Candidiasis, unspecified: Secondary | ICD-10-CM | POA: Diagnosis not present

## 2018-05-08 DIAGNOSIS — J986 Disorders of diaphragm: Secondary | ICD-10-CM | POA: Diagnosis not present

## 2018-05-08 DIAGNOSIS — Z885 Allergy status to narcotic agent status: Secondary | ICD-10-CM | POA: Diagnosis not present

## 2018-05-08 DIAGNOSIS — R599 Enlarged lymph nodes, unspecified: Secondary | ICD-10-CM | POA: Diagnosis not present

## 2018-05-08 DIAGNOSIS — Z79811 Long term (current) use of aromatase inhibitors: Secondary | ICD-10-CM | POA: Diagnosis not present

## 2018-05-08 DIAGNOSIS — Z853 Personal history of malignant neoplasm of breast: Secondary | ICD-10-CM | POA: Diagnosis not present

## 2018-05-08 DIAGNOSIS — R069 Unspecified abnormalities of breathing: Secondary | ICD-10-CM | POA: Diagnosis not present

## 2018-05-08 DIAGNOSIS — I130 Hypertensive heart and chronic kidney disease with heart failure and stage 1 through stage 4 chronic kidney disease, or unspecified chronic kidney disease: Secondary | ICD-10-CM | POA: Diagnosis not present

## 2018-05-08 DIAGNOSIS — R0609 Other forms of dyspnea: Secondary | ICD-10-CM | POA: Diagnosis not present

## 2018-05-08 DIAGNOSIS — N390 Urinary tract infection, site not specified: Secondary | ICD-10-CM | POA: Diagnosis not present

## 2018-05-08 DIAGNOSIS — I872 Venous insufficiency (chronic) (peripheral): Secondary | ICD-10-CM | POA: Diagnosis not present

## 2018-05-08 DIAGNOSIS — R590 Localized enlarged lymph nodes: Secondary | ICD-10-CM | POA: Diagnosis not present

## 2018-05-08 DIAGNOSIS — Z888 Allergy status to other drugs, medicaments and biological substances status: Secondary | ICD-10-CM | POA: Diagnosis not present

## 2018-05-08 DIAGNOSIS — E1122 Type 2 diabetes mellitus with diabetic chronic kidney disease: Secondary | ICD-10-CM | POA: Diagnosis not present

## 2018-05-08 DIAGNOSIS — R0602 Shortness of breath: Secondary | ICD-10-CM | POA: Diagnosis not present

## 2018-05-08 DIAGNOSIS — R600 Localized edema: Secondary | ICD-10-CM | POA: Diagnosis not present

## 2018-05-08 DIAGNOSIS — I340 Nonrheumatic mitral (valve) insufficiency: Secondary | ICD-10-CM | POA: Diagnosis not present

## 2018-05-08 DIAGNOSIS — R269 Unspecified abnormalities of gait and mobility: Secondary | ICD-10-CM | POA: Diagnosis not present

## 2018-05-08 DIAGNOSIS — M1990 Unspecified osteoarthritis, unspecified site: Secondary | ICD-10-CM | POA: Diagnosis not present

## 2018-05-09 DIAGNOSIS — J449 Chronic obstructive pulmonary disease, unspecified: Secondary | ICD-10-CM

## 2018-05-09 DIAGNOSIS — N390 Urinary tract infection, site not specified: Secondary | ICD-10-CM

## 2018-05-09 DIAGNOSIS — Z9981 Dependence on supplemental oxygen: Secondary | ICD-10-CM

## 2018-05-09 DIAGNOSIS — I509 Heart failure, unspecified: Secondary | ICD-10-CM

## 2018-05-09 DIAGNOSIS — D649 Anemia, unspecified: Secondary | ICD-10-CM

## 2018-05-09 DIAGNOSIS — Z853 Personal history of malignant neoplasm of breast: Secondary | ICD-10-CM

## 2018-05-09 DIAGNOSIS — R590 Localized enlarged lymph nodes: Secondary | ICD-10-CM

## 2018-05-09 DIAGNOSIS — A419 Sepsis, unspecified organism: Secondary | ICD-10-CM

## 2018-05-09 DIAGNOSIS — N189 Chronic kidney disease, unspecified: Secondary | ICD-10-CM

## 2018-05-10 DIAGNOSIS — A4151 Sepsis due to Escherichia coli [E. coli]: Secondary | ICD-10-CM | POA: Diagnosis not present

## 2018-05-10 DIAGNOSIS — I361 Nonrheumatic tricuspid (valve) insufficiency: Secondary | ICD-10-CM

## 2018-05-10 DIAGNOSIS — I340 Nonrheumatic mitral (valve) insufficiency: Secondary | ICD-10-CM

## 2018-05-10 DIAGNOSIS — I130 Hypertensive heart and chronic kidney disease with heart failure and stage 1 through stage 4 chronic kidney disease, or unspecified chronic kidney disease: Secondary | ICD-10-CM | POA: Diagnosis not present

## 2018-05-13 ENCOUNTER — Other Ambulatory Visit: Payer: Self-pay

## 2018-05-13 ENCOUNTER — Ambulatory Visit: Payer: Medicare Other | Admitting: Podiatry

## 2018-05-13 DIAGNOSIS — R601 Generalized edema: Secondary | ICD-10-CM

## 2018-05-13 DIAGNOSIS — Z9981 Dependence on supplemental oxygen: Secondary | ICD-10-CM

## 2018-05-13 DIAGNOSIS — Z79811 Long term (current) use of aromatase inhibitors: Secondary | ICD-10-CM

## 2018-05-13 DIAGNOSIS — J449 Chronic obstructive pulmonary disease, unspecified: Secondary | ICD-10-CM

## 2018-05-13 DIAGNOSIS — D649 Anemia, unspecified: Secondary | ICD-10-CM

## 2018-05-13 DIAGNOSIS — I878 Other specified disorders of veins: Secondary | ICD-10-CM

## 2018-05-13 DIAGNOSIS — I509 Heart failure, unspecified: Secondary | ICD-10-CM

## 2018-05-13 DIAGNOSIS — C50919 Malignant neoplasm of unspecified site of unspecified female breast: Secondary | ICD-10-CM

## 2018-05-13 DIAGNOSIS — Z170 Estrogen receptor positive status [ER+]: Secondary | ICD-10-CM

## 2018-05-13 DIAGNOSIS — N189 Chronic kidney disease, unspecified: Secondary | ICD-10-CM

## 2018-05-13 DIAGNOSIS — R590 Localized enlarged lymph nodes: Secondary | ICD-10-CM

## 2018-05-18 DIAGNOSIS — Z79899 Other long term (current) drug therapy: Secondary | ICD-10-CM | POA: Diagnosis not present

## 2018-05-18 DIAGNOSIS — Z853 Personal history of malignant neoplasm of breast: Secondary | ICD-10-CM | POA: Diagnosis not present

## 2018-05-18 DIAGNOSIS — Z09 Encounter for follow-up examination after completed treatment for conditions other than malignant neoplasm: Secondary | ICD-10-CM | POA: Diagnosis not present

## 2018-05-18 DIAGNOSIS — C50919 Malignant neoplasm of unspecified site of unspecified female breast: Secondary | ICD-10-CM | POA: Diagnosis not present

## 2018-05-18 DIAGNOSIS — I872 Venous insufficiency (chronic) (peripheral): Secondary | ICD-10-CM | POA: Diagnosis not present

## 2018-05-18 DIAGNOSIS — J9690 Respiratory failure, unspecified, unspecified whether with hypoxia or hypercapnia: Secondary | ICD-10-CM | POA: Diagnosis not present

## 2018-05-18 DIAGNOSIS — J449 Chronic obstructive pulmonary disease, unspecified: Secondary | ICD-10-CM | POA: Diagnosis not present

## 2018-05-18 DIAGNOSIS — Z8679 Personal history of other diseases of the circulatory system: Secondary | ICD-10-CM | POA: Diagnosis not present

## 2018-05-18 DIAGNOSIS — R0600 Dyspnea, unspecified: Secondary | ICD-10-CM | POA: Diagnosis not present

## 2018-05-18 DIAGNOSIS — I509 Heart failure, unspecified: Secondary | ICD-10-CM | POA: Diagnosis not present

## 2018-05-18 DIAGNOSIS — R5381 Other malaise: Secondary | ICD-10-CM | POA: Diagnosis not present

## 2018-05-18 DIAGNOSIS — A4151 Sepsis due to Escherichia coli [E. coli]: Secondary | ICD-10-CM | POA: Diagnosis not present

## 2018-05-18 DIAGNOSIS — R278 Other lack of coordination: Secondary | ICD-10-CM | POA: Diagnosis not present

## 2018-05-18 DIAGNOSIS — Z7401 Bed confinement status: Secondary | ICD-10-CM | POA: Diagnosis not present

## 2018-05-18 DIAGNOSIS — M1990 Unspecified osteoarthritis, unspecified site: Secondary | ICD-10-CM | POA: Diagnosis not present

## 2018-05-18 DIAGNOSIS — I87313 Chronic venous hypertension (idiopathic) with ulcer of bilateral lower extremity: Secondary | ICD-10-CM | POA: Diagnosis not present

## 2018-05-18 DIAGNOSIS — E119 Type 2 diabetes mellitus without complications: Secondary | ICD-10-CM | POA: Diagnosis not present

## 2018-05-18 DIAGNOSIS — L97822 Non-pressure chronic ulcer of other part of left lower leg with fat layer exposed: Secondary | ICD-10-CM | POA: Diagnosis not present

## 2018-05-18 DIAGNOSIS — E785 Hyperlipidemia, unspecified: Secondary | ICD-10-CM | POA: Diagnosis not present

## 2018-05-18 DIAGNOSIS — J9621 Acute and chronic respiratory failure with hypoxia: Secondary | ICD-10-CM | POA: Diagnosis not present

## 2018-05-18 DIAGNOSIS — Z9981 Dependence on supplemental oxygen: Secondary | ICD-10-CM | POA: Diagnosis not present

## 2018-05-18 DIAGNOSIS — R269 Unspecified abnormalities of gait and mobility: Secondary | ICD-10-CM | POA: Diagnosis not present

## 2018-05-18 DIAGNOSIS — M25062 Hemarthrosis, left knee: Secondary | ICD-10-CM | POA: Diagnosis not present

## 2018-05-18 DIAGNOSIS — R0609 Other forms of dyspnea: Secondary | ICD-10-CM | POA: Diagnosis not present

## 2018-05-18 DIAGNOSIS — M2550 Pain in unspecified joint: Secondary | ICD-10-CM | POA: Diagnosis not present

## 2018-05-18 DIAGNOSIS — R0689 Other abnormalities of breathing: Secondary | ICD-10-CM | POA: Diagnosis not present

## 2018-05-18 DIAGNOSIS — D649 Anemia, unspecified: Secondary | ICD-10-CM | POA: Diagnosis not present

## 2018-05-18 DIAGNOSIS — F419 Anxiety disorder, unspecified: Secondary | ICD-10-CM | POA: Diagnosis not present

## 2018-05-18 DIAGNOSIS — R0602 Shortness of breath: Secondary | ICD-10-CM | POA: Diagnosis not present

## 2018-05-18 DIAGNOSIS — R600 Localized edema: Secondary | ICD-10-CM | POA: Diagnosis not present

## 2018-05-18 DIAGNOSIS — I890 Lymphedema, not elsewhere classified: Secondary | ICD-10-CM | POA: Diagnosis not present

## 2018-05-18 DIAGNOSIS — E7800 Pure hypercholesterolemia, unspecified: Secondary | ICD-10-CM | POA: Diagnosis not present

## 2018-05-18 DIAGNOSIS — L97819 Non-pressure chronic ulcer of other part of right lower leg with unspecified severity: Secondary | ICD-10-CM | POA: Diagnosis not present

## 2018-05-18 DIAGNOSIS — R279 Unspecified lack of coordination: Secondary | ICD-10-CM | POA: Diagnosis not present

## 2018-05-18 DIAGNOSIS — I10 Essential (primary) hypertension: Secondary | ICD-10-CM | POA: Diagnosis not present

## 2018-05-18 DIAGNOSIS — I110 Hypertensive heart disease with heart failure: Secondary | ICD-10-CM | POA: Diagnosis not present

## 2018-05-18 DIAGNOSIS — Z794 Long term (current) use of insulin: Secondary | ICD-10-CM | POA: Diagnosis not present

## 2018-05-18 DIAGNOSIS — M6281 Muscle weakness (generalized): Secondary | ICD-10-CM | POA: Diagnosis not present

## 2018-05-18 DIAGNOSIS — L97829 Non-pressure chronic ulcer of other part of left lower leg with unspecified severity: Secondary | ICD-10-CM | POA: Diagnosis not present

## 2018-05-18 DIAGNOSIS — A419 Sepsis, unspecified organism: Secondary | ICD-10-CM | POA: Diagnosis not present

## 2018-05-18 DIAGNOSIS — I87312 Chronic venous hypertension (idiopathic) with ulcer of left lower extremity: Secondary | ICD-10-CM | POA: Diagnosis not present

## 2018-05-18 DIAGNOSIS — Z741 Need for assistance with personal care: Secondary | ICD-10-CM | POA: Diagnosis not present

## 2018-05-18 DIAGNOSIS — Z872 Personal history of diseases of the skin and subcutaneous tissue: Secondary | ICD-10-CM | POA: Diagnosis not present

## 2018-05-18 DIAGNOSIS — R262 Difficulty in walking, not elsewhere classified: Secondary | ICD-10-CM | POA: Diagnosis not present

## 2018-05-18 DIAGNOSIS — N179 Acute kidney failure, unspecified: Secondary | ICD-10-CM | POA: Diagnosis not present

## 2018-05-18 DIAGNOSIS — I272 Pulmonary hypertension, unspecified: Secondary | ICD-10-CM | POA: Diagnosis not present

## 2018-05-18 DIAGNOSIS — R599 Enlarged lymph nodes, unspecified: Secondary | ICD-10-CM | POA: Diagnosis not present

## 2018-05-18 DIAGNOSIS — B379 Candidiasis, unspecified: Secondary | ICD-10-CM | POA: Diagnosis not present

## 2018-05-18 DIAGNOSIS — R069 Unspecified abnormalities of breathing: Secondary | ICD-10-CM | POA: Diagnosis not present

## 2018-05-18 DIAGNOSIS — N390 Urinary tract infection, site not specified: Secondary | ICD-10-CM | POA: Diagnosis not present

## 2018-05-22 DIAGNOSIS — Z794 Long term (current) use of insulin: Secondary | ICD-10-CM | POA: Diagnosis not present

## 2018-05-22 DIAGNOSIS — C50919 Malignant neoplasm of unspecified site of unspecified female breast: Secondary | ICD-10-CM | POA: Diagnosis not present

## 2018-05-22 DIAGNOSIS — I509 Heart failure, unspecified: Secondary | ICD-10-CM | POA: Diagnosis not present

## 2018-05-22 DIAGNOSIS — Z9981 Dependence on supplemental oxygen: Secondary | ICD-10-CM | POA: Diagnosis not present

## 2018-05-22 DIAGNOSIS — I87312 Chronic venous hypertension (idiopathic) with ulcer of left lower extremity: Secondary | ICD-10-CM | POA: Diagnosis not present

## 2018-05-22 DIAGNOSIS — J449 Chronic obstructive pulmonary disease, unspecified: Secondary | ICD-10-CM | POA: Diagnosis not present

## 2018-05-22 DIAGNOSIS — E119 Type 2 diabetes mellitus without complications: Secondary | ICD-10-CM | POA: Diagnosis not present

## 2018-05-22 DIAGNOSIS — M1990 Unspecified osteoarthritis, unspecified site: Secondary | ICD-10-CM | POA: Diagnosis not present

## 2018-05-22 DIAGNOSIS — I110 Hypertensive heart disease with heart failure: Secondary | ICD-10-CM | POA: Diagnosis not present

## 2018-05-22 DIAGNOSIS — Z79899 Other long term (current) drug therapy: Secondary | ICD-10-CM | POA: Diagnosis not present

## 2018-05-22 DIAGNOSIS — L97822 Non-pressure chronic ulcer of other part of left lower leg with fat layer exposed: Secondary | ICD-10-CM | POA: Diagnosis not present

## 2018-05-23 DIAGNOSIS — R262 Difficulty in walking, not elsewhere classified: Secondary | ICD-10-CM | POA: Diagnosis not present

## 2018-05-29 DIAGNOSIS — L97829 Non-pressure chronic ulcer of other part of left lower leg with unspecified severity: Secondary | ICD-10-CM | POA: Diagnosis not present

## 2018-05-29 DIAGNOSIS — Z09 Encounter for follow-up examination after completed treatment for conditions other than malignant neoplasm: Secondary | ICD-10-CM | POA: Diagnosis not present

## 2018-05-29 DIAGNOSIS — Z872 Personal history of diseases of the skin and subcutaneous tissue: Secondary | ICD-10-CM | POA: Diagnosis not present

## 2018-05-29 DIAGNOSIS — L97819 Non-pressure chronic ulcer of other part of right lower leg with unspecified severity: Secondary | ICD-10-CM | POA: Diagnosis not present

## 2018-05-29 DIAGNOSIS — I87313 Chronic venous hypertension (idiopathic) with ulcer of bilateral lower extremity: Secondary | ICD-10-CM | POA: Diagnosis not present

## 2018-05-29 DIAGNOSIS — I890 Lymphedema, not elsewhere classified: Secondary | ICD-10-CM | POA: Diagnosis not present

## 2018-05-29 DIAGNOSIS — Z8679 Personal history of other diseases of the circulatory system: Secondary | ICD-10-CM | POA: Diagnosis not present

## 2018-06-19 DIAGNOSIS — Z741 Need for assistance with personal care: Secondary | ICD-10-CM | POA: Diagnosis not present

## 2018-06-19 DIAGNOSIS — R269 Unspecified abnormalities of gait and mobility: Secondary | ICD-10-CM | POA: Diagnosis not present

## 2018-06-19 DIAGNOSIS — R279 Unspecified lack of coordination: Secondary | ICD-10-CM | POA: Diagnosis not present

## 2018-06-19 DIAGNOSIS — M6281 Muscle weakness (generalized): Secondary | ICD-10-CM | POA: Diagnosis not present

## 2018-06-19 DIAGNOSIS — R262 Difficulty in walking, not elsewhere classified: Secondary | ICD-10-CM | POA: Diagnosis not present

## 2018-06-19 DIAGNOSIS — J9621 Acute and chronic respiratory failure with hypoxia: Secondary | ICD-10-CM | POA: Diagnosis not present

## 2018-06-19 DIAGNOSIS — R278 Other lack of coordination: Secondary | ICD-10-CM | POA: Diagnosis not present

## 2018-06-20 DIAGNOSIS — R279 Unspecified lack of coordination: Secondary | ICD-10-CM | POA: Diagnosis not present

## 2018-06-20 DIAGNOSIS — R262 Difficulty in walking, not elsewhere classified: Secondary | ICD-10-CM | POA: Diagnosis not present

## 2018-06-20 DIAGNOSIS — R278 Other lack of coordination: Secondary | ICD-10-CM | POA: Diagnosis not present

## 2018-06-20 DIAGNOSIS — M6281 Muscle weakness (generalized): Secondary | ICD-10-CM | POA: Diagnosis not present

## 2018-06-20 DIAGNOSIS — R269 Unspecified abnormalities of gait and mobility: Secondary | ICD-10-CM | POA: Diagnosis not present

## 2018-06-20 DIAGNOSIS — J9621 Acute and chronic respiratory failure with hypoxia: Secondary | ICD-10-CM | POA: Diagnosis not present

## 2018-06-20 DIAGNOSIS — Z741 Need for assistance with personal care: Secondary | ICD-10-CM | POA: Diagnosis not present

## 2018-06-21 DIAGNOSIS — M6281 Muscle weakness (generalized): Secondary | ICD-10-CM | POA: Diagnosis not present

## 2018-06-21 DIAGNOSIS — Z741 Need for assistance with personal care: Secondary | ICD-10-CM | POA: Diagnosis not present

## 2018-06-21 DIAGNOSIS — R262 Difficulty in walking, not elsewhere classified: Secondary | ICD-10-CM | POA: Diagnosis not present

## 2018-06-21 DIAGNOSIS — R279 Unspecified lack of coordination: Secondary | ICD-10-CM | POA: Diagnosis not present

## 2018-06-21 DIAGNOSIS — R278 Other lack of coordination: Secondary | ICD-10-CM | POA: Diagnosis not present

## 2018-06-21 DIAGNOSIS — R269 Unspecified abnormalities of gait and mobility: Secondary | ICD-10-CM | POA: Diagnosis not present

## 2018-06-21 DIAGNOSIS — J9621 Acute and chronic respiratory failure with hypoxia: Secondary | ICD-10-CM | POA: Diagnosis not present

## 2018-06-24 DIAGNOSIS — Z741 Need for assistance with personal care: Secondary | ICD-10-CM | POA: Diagnosis not present

## 2018-06-24 DIAGNOSIS — J9621 Acute and chronic respiratory failure with hypoxia: Secondary | ICD-10-CM | POA: Diagnosis not present

## 2018-06-24 DIAGNOSIS — R278 Other lack of coordination: Secondary | ICD-10-CM | POA: Diagnosis not present

## 2018-06-24 DIAGNOSIS — M6281 Muscle weakness (generalized): Secondary | ICD-10-CM | POA: Diagnosis not present

## 2018-06-24 DIAGNOSIS — R269 Unspecified abnormalities of gait and mobility: Secondary | ICD-10-CM | POA: Diagnosis not present

## 2018-06-24 DIAGNOSIS — R279 Unspecified lack of coordination: Secondary | ICD-10-CM | POA: Diagnosis not present

## 2018-06-24 DIAGNOSIS — R262 Difficulty in walking, not elsewhere classified: Secondary | ICD-10-CM | POA: Diagnosis not present

## 2018-06-27 DIAGNOSIS — R262 Difficulty in walking, not elsewhere classified: Secondary | ICD-10-CM | POA: Diagnosis not present

## 2018-06-27 DIAGNOSIS — R278 Other lack of coordination: Secondary | ICD-10-CM | POA: Diagnosis not present

## 2018-06-27 DIAGNOSIS — Z741 Need for assistance with personal care: Secondary | ICD-10-CM | POA: Diagnosis not present

## 2018-06-27 DIAGNOSIS — R269 Unspecified abnormalities of gait and mobility: Secondary | ICD-10-CM | POA: Diagnosis not present

## 2018-06-27 DIAGNOSIS — M6281 Muscle weakness (generalized): Secondary | ICD-10-CM | POA: Diagnosis not present

## 2018-06-27 DIAGNOSIS — R279 Unspecified lack of coordination: Secondary | ICD-10-CM | POA: Diagnosis not present

## 2018-06-27 DIAGNOSIS — J9621 Acute and chronic respiratory failure with hypoxia: Secondary | ICD-10-CM | POA: Diagnosis not present

## 2018-06-28 DIAGNOSIS — R262 Difficulty in walking, not elsewhere classified: Secondary | ICD-10-CM | POA: Diagnosis not present

## 2018-06-28 DIAGNOSIS — R279 Unspecified lack of coordination: Secondary | ICD-10-CM | POA: Diagnosis not present

## 2018-06-28 DIAGNOSIS — R269 Unspecified abnormalities of gait and mobility: Secondary | ICD-10-CM | POA: Diagnosis not present

## 2018-06-28 DIAGNOSIS — Z741 Need for assistance with personal care: Secondary | ICD-10-CM | POA: Diagnosis not present

## 2018-06-28 DIAGNOSIS — M6281 Muscle weakness (generalized): Secondary | ICD-10-CM | POA: Diagnosis not present

## 2018-06-28 DIAGNOSIS — R278 Other lack of coordination: Secondary | ICD-10-CM | POA: Diagnosis not present

## 2018-06-28 DIAGNOSIS — J9621 Acute and chronic respiratory failure with hypoxia: Secondary | ICD-10-CM | POA: Diagnosis not present

## 2018-07-02 DIAGNOSIS — M6281 Muscle weakness (generalized): Secondary | ICD-10-CM | POA: Diagnosis not present

## 2018-07-02 DIAGNOSIS — Z741 Need for assistance with personal care: Secondary | ICD-10-CM | POA: Diagnosis not present

## 2018-07-02 DIAGNOSIS — R269 Unspecified abnormalities of gait and mobility: Secondary | ICD-10-CM | POA: Diagnosis not present

## 2018-07-02 DIAGNOSIS — R279 Unspecified lack of coordination: Secondary | ICD-10-CM | POA: Diagnosis not present

## 2018-07-02 DIAGNOSIS — R262 Difficulty in walking, not elsewhere classified: Secondary | ICD-10-CM | POA: Diagnosis not present

## 2018-07-02 DIAGNOSIS — R278 Other lack of coordination: Secondary | ICD-10-CM | POA: Diagnosis not present

## 2018-07-02 DIAGNOSIS — J9621 Acute and chronic respiratory failure with hypoxia: Secondary | ICD-10-CM | POA: Diagnosis not present

## 2018-07-03 ENCOUNTER — Other Ambulatory Visit: Payer: Medicare Other

## 2018-07-03 DIAGNOSIS — R262 Difficulty in walking, not elsewhere classified: Secondary | ICD-10-CM | POA: Diagnosis not present

## 2018-07-03 DIAGNOSIS — Z741 Need for assistance with personal care: Secondary | ICD-10-CM | POA: Diagnosis not present

## 2018-07-03 DIAGNOSIS — J9621 Acute and chronic respiratory failure with hypoxia: Secondary | ICD-10-CM | POA: Diagnosis not present

## 2018-07-03 DIAGNOSIS — R269 Unspecified abnormalities of gait and mobility: Secondary | ICD-10-CM | POA: Diagnosis not present

## 2018-07-03 DIAGNOSIS — M6281 Muscle weakness (generalized): Secondary | ICD-10-CM | POA: Diagnosis not present

## 2018-07-03 DIAGNOSIS — R279 Unspecified lack of coordination: Secondary | ICD-10-CM | POA: Diagnosis not present

## 2018-07-03 DIAGNOSIS — R278 Other lack of coordination: Secondary | ICD-10-CM | POA: Diagnosis not present

## 2018-07-06 DIAGNOSIS — E1122 Type 2 diabetes mellitus with diabetic chronic kidney disease: Secondary | ICD-10-CM | POA: Diagnosis not present

## 2018-07-06 DIAGNOSIS — E785 Hyperlipidemia, unspecified: Secondary | ICD-10-CM | POA: Diagnosis not present

## 2018-07-06 DIAGNOSIS — Z4800 Encounter for change or removal of nonsurgical wound dressing: Secondary | ICD-10-CM | POA: Diagnosis not present

## 2018-07-06 DIAGNOSIS — I272 Pulmonary hypertension, unspecified: Secondary | ICD-10-CM | POA: Diagnosis not present

## 2018-07-06 DIAGNOSIS — Z794 Long term (current) use of insulin: Secondary | ICD-10-CM | POA: Diagnosis not present

## 2018-07-06 DIAGNOSIS — L97819 Non-pressure chronic ulcer of other part of right lower leg with unspecified severity: Secondary | ICD-10-CM | POA: Diagnosis not present

## 2018-07-06 DIAGNOSIS — Z7902 Long term (current) use of antithrombotics/antiplatelets: Secondary | ICD-10-CM | POA: Diagnosis not present

## 2018-07-06 DIAGNOSIS — E1151 Type 2 diabetes mellitus with diabetic peripheral angiopathy without gangrene: Secondary | ICD-10-CM | POA: Diagnosis not present

## 2018-07-06 DIAGNOSIS — D631 Anemia in chronic kidney disease: Secondary | ICD-10-CM | POA: Diagnosis not present

## 2018-07-06 DIAGNOSIS — J449 Chronic obstructive pulmonary disease, unspecified: Secondary | ICD-10-CM | POA: Diagnosis not present

## 2018-07-06 DIAGNOSIS — I5030 Unspecified diastolic (congestive) heart failure: Secondary | ICD-10-CM | POA: Diagnosis not present

## 2018-07-06 DIAGNOSIS — C50919 Malignant neoplasm of unspecified site of unspecified female breast: Secondary | ICD-10-CM | POA: Diagnosis not present

## 2018-07-06 DIAGNOSIS — L97229 Non-pressure chronic ulcer of left calf with unspecified severity: Secondary | ICD-10-CM | POA: Diagnosis not present

## 2018-07-06 DIAGNOSIS — N189 Chronic kidney disease, unspecified: Secondary | ICD-10-CM | POA: Diagnosis not present

## 2018-07-06 DIAGNOSIS — L97829 Non-pressure chronic ulcer of other part of left lower leg with unspecified severity: Secondary | ICD-10-CM | POA: Diagnosis not present

## 2018-07-06 DIAGNOSIS — I130 Hypertensive heart and chronic kidney disease with heart failure and stage 1 through stage 4 chronic kidney disease, or unspecified chronic kidney disease: Secondary | ICD-10-CM | POA: Diagnosis not present

## 2018-07-06 DIAGNOSIS — J9610 Chronic respiratory failure, unspecified whether with hypoxia or hypercapnia: Secondary | ICD-10-CM | POA: Diagnosis not present

## 2018-07-06 DIAGNOSIS — Z9981 Dependence on supplemental oxygen: Secondary | ICD-10-CM | POA: Diagnosis not present

## 2018-07-06 DIAGNOSIS — I872 Venous insufficiency (chronic) (peripheral): Secondary | ICD-10-CM | POA: Diagnosis not present

## 2018-07-06 DIAGNOSIS — Z87440 Personal history of urinary (tract) infections: Secondary | ICD-10-CM | POA: Diagnosis not present

## 2018-07-06 DIAGNOSIS — Z7982 Long term (current) use of aspirin: Secondary | ICD-10-CM | POA: Diagnosis not present

## 2018-07-07 DIAGNOSIS — I872 Venous insufficiency (chronic) (peripheral): Secondary | ICD-10-CM | POA: Diagnosis not present

## 2018-07-07 DIAGNOSIS — J9610 Chronic respiratory failure, unspecified whether with hypoxia or hypercapnia: Secondary | ICD-10-CM | POA: Diagnosis not present

## 2018-07-07 DIAGNOSIS — Z794 Long term (current) use of insulin: Secondary | ICD-10-CM | POA: Diagnosis not present

## 2018-07-07 DIAGNOSIS — D631 Anemia in chronic kidney disease: Secondary | ICD-10-CM | POA: Diagnosis not present

## 2018-07-07 DIAGNOSIS — E785 Hyperlipidemia, unspecified: Secondary | ICD-10-CM | POA: Diagnosis not present

## 2018-07-07 DIAGNOSIS — J449 Chronic obstructive pulmonary disease, unspecified: Secondary | ICD-10-CM | POA: Diagnosis not present

## 2018-07-07 DIAGNOSIS — L97819 Non-pressure chronic ulcer of other part of right lower leg with unspecified severity: Secondary | ICD-10-CM | POA: Diagnosis not present

## 2018-07-07 DIAGNOSIS — I5030 Unspecified diastolic (congestive) heart failure: Secondary | ICD-10-CM | POA: Diagnosis not present

## 2018-07-07 DIAGNOSIS — I130 Hypertensive heart and chronic kidney disease with heart failure and stage 1 through stage 4 chronic kidney disease, or unspecified chronic kidney disease: Secondary | ICD-10-CM | POA: Diagnosis not present

## 2018-07-07 DIAGNOSIS — L97829 Non-pressure chronic ulcer of other part of left lower leg with unspecified severity: Secondary | ICD-10-CM | POA: Diagnosis not present

## 2018-07-07 DIAGNOSIS — Z7902 Long term (current) use of antithrombotics/antiplatelets: Secondary | ICD-10-CM | POA: Diagnosis not present

## 2018-07-07 DIAGNOSIS — Z9981 Dependence on supplemental oxygen: Secondary | ICD-10-CM | POA: Diagnosis not present

## 2018-07-07 DIAGNOSIS — L97229 Non-pressure chronic ulcer of left calf with unspecified severity: Secondary | ICD-10-CM | POA: Diagnosis not present

## 2018-07-07 DIAGNOSIS — E1122 Type 2 diabetes mellitus with diabetic chronic kidney disease: Secondary | ICD-10-CM | POA: Diagnosis not present

## 2018-07-07 DIAGNOSIS — Z87440 Personal history of urinary (tract) infections: Secondary | ICD-10-CM | POA: Diagnosis not present

## 2018-07-07 DIAGNOSIS — C50919 Malignant neoplasm of unspecified site of unspecified female breast: Secondary | ICD-10-CM | POA: Diagnosis not present

## 2018-07-07 DIAGNOSIS — Z4800 Encounter for change or removal of nonsurgical wound dressing: Secondary | ICD-10-CM | POA: Diagnosis not present

## 2018-07-07 DIAGNOSIS — I272 Pulmonary hypertension, unspecified: Secondary | ICD-10-CM | POA: Diagnosis not present

## 2018-07-07 DIAGNOSIS — Z7982 Long term (current) use of aspirin: Secondary | ICD-10-CM | POA: Diagnosis not present

## 2018-07-07 DIAGNOSIS — E1151 Type 2 diabetes mellitus with diabetic peripheral angiopathy without gangrene: Secondary | ICD-10-CM | POA: Diagnosis not present

## 2018-07-07 DIAGNOSIS — N189 Chronic kidney disease, unspecified: Secondary | ICD-10-CM | POA: Diagnosis not present

## 2018-07-09 DIAGNOSIS — J9690 Respiratory failure, unspecified, unspecified whether with hypoxia or hypercapnia: Secondary | ICD-10-CM | POA: Diagnosis not present

## 2018-07-09 DIAGNOSIS — A4189 Other specified sepsis: Secondary | ICD-10-CM | POA: Diagnosis not present

## 2018-07-09 DIAGNOSIS — R062 Wheezing: Secondary | ICD-10-CM | POA: Diagnosis not present

## 2018-07-10 DIAGNOSIS — J9610 Chronic respiratory failure, unspecified whether with hypoxia or hypercapnia: Secondary | ICD-10-CM | POA: Diagnosis not present

## 2018-07-10 DIAGNOSIS — Z7982 Long term (current) use of aspirin: Secondary | ICD-10-CM | POA: Diagnosis not present

## 2018-07-10 DIAGNOSIS — E785 Hyperlipidemia, unspecified: Secondary | ICD-10-CM | POA: Diagnosis not present

## 2018-07-10 DIAGNOSIS — E1122 Type 2 diabetes mellitus with diabetic chronic kidney disease: Secondary | ICD-10-CM | POA: Diagnosis not present

## 2018-07-10 DIAGNOSIS — C50919 Malignant neoplasm of unspecified site of unspecified female breast: Secondary | ICD-10-CM | POA: Diagnosis not present

## 2018-07-10 DIAGNOSIS — L97829 Non-pressure chronic ulcer of other part of left lower leg with unspecified severity: Secondary | ICD-10-CM | POA: Diagnosis not present

## 2018-07-10 DIAGNOSIS — Z9981 Dependence on supplemental oxygen: Secondary | ICD-10-CM | POA: Diagnosis not present

## 2018-07-10 DIAGNOSIS — L97819 Non-pressure chronic ulcer of other part of right lower leg with unspecified severity: Secondary | ICD-10-CM | POA: Diagnosis not present

## 2018-07-10 DIAGNOSIS — I5030 Unspecified diastolic (congestive) heart failure: Secondary | ICD-10-CM | POA: Diagnosis not present

## 2018-07-10 DIAGNOSIS — L97229 Non-pressure chronic ulcer of left calf with unspecified severity: Secondary | ICD-10-CM | POA: Diagnosis not present

## 2018-07-10 DIAGNOSIS — Z794 Long term (current) use of insulin: Secondary | ICD-10-CM | POA: Diagnosis not present

## 2018-07-10 DIAGNOSIS — Z87440 Personal history of urinary (tract) infections: Secondary | ICD-10-CM | POA: Diagnosis not present

## 2018-07-10 DIAGNOSIS — J449 Chronic obstructive pulmonary disease, unspecified: Secondary | ICD-10-CM | POA: Diagnosis not present

## 2018-07-10 DIAGNOSIS — D631 Anemia in chronic kidney disease: Secondary | ICD-10-CM | POA: Diagnosis not present

## 2018-07-10 DIAGNOSIS — I872 Venous insufficiency (chronic) (peripheral): Secondary | ICD-10-CM | POA: Diagnosis not present

## 2018-07-10 DIAGNOSIS — Z7902 Long term (current) use of antithrombotics/antiplatelets: Secondary | ICD-10-CM | POA: Diagnosis not present

## 2018-07-10 DIAGNOSIS — E1151 Type 2 diabetes mellitus with diabetic peripheral angiopathy without gangrene: Secondary | ICD-10-CM | POA: Diagnosis not present

## 2018-07-10 DIAGNOSIS — I272 Pulmonary hypertension, unspecified: Secondary | ICD-10-CM | POA: Diagnosis not present

## 2018-07-10 DIAGNOSIS — N189 Chronic kidney disease, unspecified: Secondary | ICD-10-CM | POA: Diagnosis not present

## 2018-07-10 DIAGNOSIS — Z4800 Encounter for change or removal of nonsurgical wound dressing: Secondary | ICD-10-CM | POA: Diagnosis not present

## 2018-07-10 DIAGNOSIS — I130 Hypertensive heart and chronic kidney disease with heart failure and stage 1 through stage 4 chronic kidney disease, or unspecified chronic kidney disease: Secondary | ICD-10-CM | POA: Diagnosis not present

## 2018-07-11 DIAGNOSIS — Z7902 Long term (current) use of antithrombotics/antiplatelets: Secondary | ICD-10-CM | POA: Diagnosis not present

## 2018-07-11 DIAGNOSIS — N189 Chronic kidney disease, unspecified: Secondary | ICD-10-CM | POA: Diagnosis not present

## 2018-07-11 DIAGNOSIS — E785 Hyperlipidemia, unspecified: Secondary | ICD-10-CM | POA: Diagnosis not present

## 2018-07-11 DIAGNOSIS — D631 Anemia in chronic kidney disease: Secondary | ICD-10-CM | POA: Diagnosis not present

## 2018-07-11 DIAGNOSIS — I130 Hypertensive heart and chronic kidney disease with heart failure and stage 1 through stage 4 chronic kidney disease, or unspecified chronic kidney disease: Secondary | ICD-10-CM | POA: Diagnosis not present

## 2018-07-11 DIAGNOSIS — Z7982 Long term (current) use of aspirin: Secondary | ICD-10-CM | POA: Diagnosis not present

## 2018-07-11 DIAGNOSIS — E1151 Type 2 diabetes mellitus with diabetic peripheral angiopathy without gangrene: Secondary | ICD-10-CM | POA: Diagnosis not present

## 2018-07-11 DIAGNOSIS — J9610 Chronic respiratory failure, unspecified whether with hypoxia or hypercapnia: Secondary | ICD-10-CM | POA: Diagnosis not present

## 2018-07-11 DIAGNOSIS — L97819 Non-pressure chronic ulcer of other part of right lower leg with unspecified severity: Secondary | ICD-10-CM | POA: Diagnosis not present

## 2018-07-11 DIAGNOSIS — C50919 Malignant neoplasm of unspecified site of unspecified female breast: Secondary | ICD-10-CM | POA: Diagnosis not present

## 2018-07-11 DIAGNOSIS — J449 Chronic obstructive pulmonary disease, unspecified: Secondary | ICD-10-CM | POA: Diagnosis not present

## 2018-07-11 DIAGNOSIS — Z4800 Encounter for change or removal of nonsurgical wound dressing: Secondary | ICD-10-CM | POA: Diagnosis not present

## 2018-07-11 DIAGNOSIS — Z794 Long term (current) use of insulin: Secondary | ICD-10-CM | POA: Diagnosis not present

## 2018-07-11 DIAGNOSIS — L97229 Non-pressure chronic ulcer of left calf with unspecified severity: Secondary | ICD-10-CM | POA: Diagnosis not present

## 2018-07-11 DIAGNOSIS — L97829 Non-pressure chronic ulcer of other part of left lower leg with unspecified severity: Secondary | ICD-10-CM | POA: Diagnosis not present

## 2018-07-11 DIAGNOSIS — I872 Venous insufficiency (chronic) (peripheral): Secondary | ICD-10-CM | POA: Diagnosis not present

## 2018-07-11 DIAGNOSIS — I5030 Unspecified diastolic (congestive) heart failure: Secondary | ICD-10-CM | POA: Diagnosis not present

## 2018-07-11 DIAGNOSIS — E1122 Type 2 diabetes mellitus with diabetic chronic kidney disease: Secondary | ICD-10-CM | POA: Diagnosis not present

## 2018-07-11 DIAGNOSIS — Z87440 Personal history of urinary (tract) infections: Secondary | ICD-10-CM | POA: Diagnosis not present

## 2018-07-11 DIAGNOSIS — I272 Pulmonary hypertension, unspecified: Secondary | ICD-10-CM | POA: Diagnosis not present

## 2018-07-11 DIAGNOSIS — Z9981 Dependence on supplemental oxygen: Secondary | ICD-10-CM | POA: Diagnosis not present

## 2018-07-12 DIAGNOSIS — L97229 Non-pressure chronic ulcer of left calf with unspecified severity: Secondary | ICD-10-CM | POA: Diagnosis not present

## 2018-07-12 DIAGNOSIS — L97819 Non-pressure chronic ulcer of other part of right lower leg with unspecified severity: Secondary | ICD-10-CM | POA: Diagnosis not present

## 2018-07-12 DIAGNOSIS — C50919 Malignant neoplasm of unspecified site of unspecified female breast: Secondary | ICD-10-CM | POA: Diagnosis not present

## 2018-07-12 DIAGNOSIS — Z87440 Personal history of urinary (tract) infections: Secondary | ICD-10-CM | POA: Diagnosis not present

## 2018-07-12 DIAGNOSIS — Z4800 Encounter for change or removal of nonsurgical wound dressing: Secondary | ICD-10-CM | POA: Diagnosis not present

## 2018-07-12 DIAGNOSIS — E785 Hyperlipidemia, unspecified: Secondary | ICD-10-CM | POA: Diagnosis not present

## 2018-07-12 DIAGNOSIS — Z7982 Long term (current) use of aspirin: Secondary | ICD-10-CM | POA: Diagnosis not present

## 2018-07-12 DIAGNOSIS — I5030 Unspecified diastolic (congestive) heart failure: Secondary | ICD-10-CM | POA: Diagnosis not present

## 2018-07-12 DIAGNOSIS — E1122 Type 2 diabetes mellitus with diabetic chronic kidney disease: Secondary | ICD-10-CM | POA: Diagnosis not present

## 2018-07-12 DIAGNOSIS — J449 Chronic obstructive pulmonary disease, unspecified: Secondary | ICD-10-CM | POA: Diagnosis not present

## 2018-07-12 DIAGNOSIS — Z7902 Long term (current) use of antithrombotics/antiplatelets: Secondary | ICD-10-CM | POA: Diagnosis not present

## 2018-07-12 DIAGNOSIS — I272 Pulmonary hypertension, unspecified: Secondary | ICD-10-CM | POA: Diagnosis not present

## 2018-07-12 DIAGNOSIS — J9610 Chronic respiratory failure, unspecified whether with hypoxia or hypercapnia: Secondary | ICD-10-CM | POA: Diagnosis not present

## 2018-07-12 DIAGNOSIS — Z9981 Dependence on supplemental oxygen: Secondary | ICD-10-CM | POA: Diagnosis not present

## 2018-07-12 DIAGNOSIS — N189 Chronic kidney disease, unspecified: Secondary | ICD-10-CM | POA: Diagnosis not present

## 2018-07-12 DIAGNOSIS — I872 Venous insufficiency (chronic) (peripheral): Secondary | ICD-10-CM | POA: Diagnosis not present

## 2018-07-12 DIAGNOSIS — E1151 Type 2 diabetes mellitus with diabetic peripheral angiopathy without gangrene: Secondary | ICD-10-CM | POA: Diagnosis not present

## 2018-07-12 DIAGNOSIS — L97829 Non-pressure chronic ulcer of other part of left lower leg with unspecified severity: Secondary | ICD-10-CM | POA: Diagnosis not present

## 2018-07-12 DIAGNOSIS — I130 Hypertensive heart and chronic kidney disease with heart failure and stage 1 through stage 4 chronic kidney disease, or unspecified chronic kidney disease: Secondary | ICD-10-CM | POA: Diagnosis not present

## 2018-07-12 DIAGNOSIS — Z794 Long term (current) use of insulin: Secondary | ICD-10-CM | POA: Diagnosis not present

## 2018-07-12 DIAGNOSIS — D631 Anemia in chronic kidney disease: Secondary | ICD-10-CM | POA: Diagnosis not present

## 2018-07-15 DIAGNOSIS — Z7902 Long term (current) use of antithrombotics/antiplatelets: Secondary | ICD-10-CM | POA: Diagnosis not present

## 2018-07-15 DIAGNOSIS — L97829 Non-pressure chronic ulcer of other part of left lower leg with unspecified severity: Secondary | ICD-10-CM | POA: Diagnosis not present

## 2018-07-15 DIAGNOSIS — I130 Hypertensive heart and chronic kidney disease with heart failure and stage 1 through stage 4 chronic kidney disease, or unspecified chronic kidney disease: Secondary | ICD-10-CM | POA: Diagnosis not present

## 2018-07-15 DIAGNOSIS — L97229 Non-pressure chronic ulcer of left calf with unspecified severity: Secondary | ICD-10-CM | POA: Diagnosis not present

## 2018-07-15 DIAGNOSIS — I272 Pulmonary hypertension, unspecified: Secondary | ICD-10-CM | POA: Diagnosis not present

## 2018-07-15 DIAGNOSIS — Z4800 Encounter for change or removal of nonsurgical wound dressing: Secondary | ICD-10-CM | POA: Diagnosis not present

## 2018-07-15 DIAGNOSIS — D631 Anemia in chronic kidney disease: Secondary | ICD-10-CM | POA: Diagnosis not present

## 2018-07-15 DIAGNOSIS — J9610 Chronic respiratory failure, unspecified whether with hypoxia or hypercapnia: Secondary | ICD-10-CM | POA: Diagnosis not present

## 2018-07-15 DIAGNOSIS — Z794 Long term (current) use of insulin: Secondary | ICD-10-CM | POA: Diagnosis not present

## 2018-07-15 DIAGNOSIS — J449 Chronic obstructive pulmonary disease, unspecified: Secondary | ICD-10-CM | POA: Diagnosis not present

## 2018-07-15 DIAGNOSIS — E785 Hyperlipidemia, unspecified: Secondary | ICD-10-CM | POA: Diagnosis not present

## 2018-07-15 DIAGNOSIS — L97819 Non-pressure chronic ulcer of other part of right lower leg with unspecified severity: Secondary | ICD-10-CM | POA: Diagnosis not present

## 2018-07-15 DIAGNOSIS — Z87440 Personal history of urinary (tract) infections: Secondary | ICD-10-CM | POA: Diagnosis not present

## 2018-07-15 DIAGNOSIS — C50919 Malignant neoplasm of unspecified site of unspecified female breast: Secondary | ICD-10-CM | POA: Diagnosis not present

## 2018-07-15 DIAGNOSIS — I5030 Unspecified diastolic (congestive) heart failure: Secondary | ICD-10-CM | POA: Diagnosis not present

## 2018-07-15 DIAGNOSIS — Z7982 Long term (current) use of aspirin: Secondary | ICD-10-CM | POA: Diagnosis not present

## 2018-07-15 DIAGNOSIS — E1122 Type 2 diabetes mellitus with diabetic chronic kidney disease: Secondary | ICD-10-CM | POA: Diagnosis not present

## 2018-07-15 DIAGNOSIS — N189 Chronic kidney disease, unspecified: Secondary | ICD-10-CM | POA: Diagnosis not present

## 2018-07-15 DIAGNOSIS — E1151 Type 2 diabetes mellitus with diabetic peripheral angiopathy without gangrene: Secondary | ICD-10-CM | POA: Diagnosis not present

## 2018-07-15 DIAGNOSIS — Z9981 Dependence on supplemental oxygen: Secondary | ICD-10-CM | POA: Diagnosis not present

## 2018-07-15 DIAGNOSIS — I872 Venous insufficiency (chronic) (peripheral): Secondary | ICD-10-CM | POA: Diagnosis not present

## 2018-07-16 DIAGNOSIS — E785 Hyperlipidemia, unspecified: Secondary | ICD-10-CM | POA: Diagnosis not present

## 2018-07-16 DIAGNOSIS — Z87440 Personal history of urinary (tract) infections: Secondary | ICD-10-CM | POA: Diagnosis not present

## 2018-07-16 DIAGNOSIS — J9610 Chronic respiratory failure, unspecified whether with hypoxia or hypercapnia: Secondary | ICD-10-CM | POA: Diagnosis not present

## 2018-07-16 DIAGNOSIS — J449 Chronic obstructive pulmonary disease, unspecified: Secondary | ICD-10-CM | POA: Diagnosis not present

## 2018-07-16 DIAGNOSIS — I130 Hypertensive heart and chronic kidney disease with heart failure and stage 1 through stage 4 chronic kidney disease, or unspecified chronic kidney disease: Secondary | ICD-10-CM | POA: Diagnosis not present

## 2018-07-16 DIAGNOSIS — N189 Chronic kidney disease, unspecified: Secondary | ICD-10-CM | POA: Diagnosis not present

## 2018-07-16 DIAGNOSIS — I5030 Unspecified diastolic (congestive) heart failure: Secondary | ICD-10-CM | POA: Diagnosis not present

## 2018-07-16 DIAGNOSIS — C50919 Malignant neoplasm of unspecified site of unspecified female breast: Secondary | ICD-10-CM | POA: Diagnosis not present

## 2018-07-16 DIAGNOSIS — L97819 Non-pressure chronic ulcer of other part of right lower leg with unspecified severity: Secondary | ICD-10-CM | POA: Diagnosis not present

## 2018-07-16 DIAGNOSIS — Z794 Long term (current) use of insulin: Secondary | ICD-10-CM | POA: Diagnosis not present

## 2018-07-16 DIAGNOSIS — E1151 Type 2 diabetes mellitus with diabetic peripheral angiopathy without gangrene: Secondary | ICD-10-CM | POA: Diagnosis not present

## 2018-07-16 DIAGNOSIS — D631 Anemia in chronic kidney disease: Secondary | ICD-10-CM | POA: Diagnosis not present

## 2018-07-16 DIAGNOSIS — I872 Venous insufficiency (chronic) (peripheral): Secondary | ICD-10-CM | POA: Diagnosis not present

## 2018-07-16 DIAGNOSIS — Z4800 Encounter for change or removal of nonsurgical wound dressing: Secondary | ICD-10-CM | POA: Diagnosis not present

## 2018-07-16 DIAGNOSIS — L97829 Non-pressure chronic ulcer of other part of left lower leg with unspecified severity: Secondary | ICD-10-CM | POA: Diagnosis not present

## 2018-07-16 DIAGNOSIS — Z7902 Long term (current) use of antithrombotics/antiplatelets: Secondary | ICD-10-CM | POA: Diagnosis not present

## 2018-07-16 DIAGNOSIS — L97229 Non-pressure chronic ulcer of left calf with unspecified severity: Secondary | ICD-10-CM | POA: Diagnosis not present

## 2018-07-16 DIAGNOSIS — E1122 Type 2 diabetes mellitus with diabetic chronic kidney disease: Secondary | ICD-10-CM | POA: Diagnosis not present

## 2018-07-16 DIAGNOSIS — Z7982 Long term (current) use of aspirin: Secondary | ICD-10-CM | POA: Diagnosis not present

## 2018-07-16 DIAGNOSIS — I272 Pulmonary hypertension, unspecified: Secondary | ICD-10-CM | POA: Diagnosis not present

## 2018-07-16 DIAGNOSIS — Z9981 Dependence on supplemental oxygen: Secondary | ICD-10-CM | POA: Diagnosis not present

## 2018-07-17 DIAGNOSIS — I5030 Unspecified diastolic (congestive) heart failure: Secondary | ICD-10-CM | POA: Diagnosis not present

## 2018-07-17 DIAGNOSIS — E785 Hyperlipidemia, unspecified: Secondary | ICD-10-CM | POA: Diagnosis not present

## 2018-07-17 DIAGNOSIS — E1122 Type 2 diabetes mellitus with diabetic chronic kidney disease: Secondary | ICD-10-CM | POA: Diagnosis not present

## 2018-07-17 DIAGNOSIS — D631 Anemia in chronic kidney disease: Secondary | ICD-10-CM | POA: Diagnosis not present

## 2018-07-17 DIAGNOSIS — Z794 Long term (current) use of insulin: Secondary | ICD-10-CM | POA: Diagnosis not present

## 2018-07-17 DIAGNOSIS — I130 Hypertensive heart and chronic kidney disease with heart failure and stage 1 through stage 4 chronic kidney disease, or unspecified chronic kidney disease: Secondary | ICD-10-CM | POA: Diagnosis not present

## 2018-07-17 DIAGNOSIS — L97229 Non-pressure chronic ulcer of left calf with unspecified severity: Secondary | ICD-10-CM | POA: Diagnosis not present

## 2018-07-17 DIAGNOSIS — Z7902 Long term (current) use of antithrombotics/antiplatelets: Secondary | ICD-10-CM | POA: Diagnosis not present

## 2018-07-17 DIAGNOSIS — L97829 Non-pressure chronic ulcer of other part of left lower leg with unspecified severity: Secondary | ICD-10-CM | POA: Diagnosis not present

## 2018-07-17 DIAGNOSIS — Z4800 Encounter for change or removal of nonsurgical wound dressing: Secondary | ICD-10-CM | POA: Diagnosis not present

## 2018-07-17 DIAGNOSIS — J449 Chronic obstructive pulmonary disease, unspecified: Secondary | ICD-10-CM | POA: Diagnosis not present

## 2018-07-17 DIAGNOSIS — J9610 Chronic respiratory failure, unspecified whether with hypoxia or hypercapnia: Secondary | ICD-10-CM | POA: Diagnosis not present

## 2018-07-17 DIAGNOSIS — Z7982 Long term (current) use of aspirin: Secondary | ICD-10-CM | POA: Diagnosis not present

## 2018-07-17 DIAGNOSIS — C50919 Malignant neoplasm of unspecified site of unspecified female breast: Secondary | ICD-10-CM | POA: Diagnosis not present

## 2018-07-17 DIAGNOSIS — N189 Chronic kidney disease, unspecified: Secondary | ICD-10-CM | POA: Diagnosis not present

## 2018-07-17 DIAGNOSIS — Z87440 Personal history of urinary (tract) infections: Secondary | ICD-10-CM | POA: Diagnosis not present

## 2018-07-17 DIAGNOSIS — Z9981 Dependence on supplemental oxygen: Secondary | ICD-10-CM | POA: Diagnosis not present

## 2018-07-17 DIAGNOSIS — L97819 Non-pressure chronic ulcer of other part of right lower leg with unspecified severity: Secondary | ICD-10-CM | POA: Diagnosis not present

## 2018-07-17 DIAGNOSIS — I272 Pulmonary hypertension, unspecified: Secondary | ICD-10-CM | POA: Diagnosis not present

## 2018-07-17 DIAGNOSIS — I872 Venous insufficiency (chronic) (peripheral): Secondary | ICD-10-CM | POA: Diagnosis not present

## 2018-07-17 DIAGNOSIS — E1151 Type 2 diabetes mellitus with diabetic peripheral angiopathy without gangrene: Secondary | ICD-10-CM | POA: Diagnosis not present

## 2018-07-18 DIAGNOSIS — I87333 Chronic venous hypertension (idiopathic) with ulcer and inflammation of bilateral lower extremity: Secondary | ICD-10-CM | POA: Diagnosis not present

## 2018-07-18 DIAGNOSIS — J449 Chronic obstructive pulmonary disease, unspecified: Secondary | ICD-10-CM | POA: Diagnosis not present

## 2018-07-18 DIAGNOSIS — I5032 Chronic diastolic (congestive) heart failure: Secondary | ICD-10-CM | POA: Diagnosis not present

## 2018-07-18 DIAGNOSIS — N183 Chronic kidney disease, stage 3 (moderate): Secondary | ICD-10-CM | POA: Diagnosis not present

## 2018-07-18 DIAGNOSIS — E1122 Type 2 diabetes mellitus with diabetic chronic kidney disease: Secondary | ICD-10-CM | POA: Diagnosis not present

## 2018-07-18 DIAGNOSIS — E7800 Pure hypercholesterolemia, unspecified: Secondary | ICD-10-CM | POA: Diagnosis not present

## 2018-07-18 DIAGNOSIS — J9611 Chronic respiratory failure with hypoxia: Secondary | ICD-10-CM | POA: Diagnosis not present

## 2018-07-19 DIAGNOSIS — L97819 Non-pressure chronic ulcer of other part of right lower leg with unspecified severity: Secondary | ICD-10-CM | POA: Diagnosis not present

## 2018-07-19 DIAGNOSIS — N189 Chronic kidney disease, unspecified: Secondary | ICD-10-CM | POA: Diagnosis not present

## 2018-07-19 DIAGNOSIS — D631 Anemia in chronic kidney disease: Secondary | ICD-10-CM | POA: Diagnosis not present

## 2018-07-19 DIAGNOSIS — J449 Chronic obstructive pulmonary disease, unspecified: Secondary | ICD-10-CM | POA: Diagnosis not present

## 2018-07-19 DIAGNOSIS — E785 Hyperlipidemia, unspecified: Secondary | ICD-10-CM | POA: Diagnosis not present

## 2018-07-19 DIAGNOSIS — J9610 Chronic respiratory failure, unspecified whether with hypoxia or hypercapnia: Secondary | ICD-10-CM | POA: Diagnosis not present

## 2018-07-19 DIAGNOSIS — I872 Venous insufficiency (chronic) (peripheral): Secondary | ICD-10-CM | POA: Diagnosis not present

## 2018-07-19 DIAGNOSIS — L97829 Non-pressure chronic ulcer of other part of left lower leg with unspecified severity: Secondary | ICD-10-CM | POA: Diagnosis not present

## 2018-07-19 DIAGNOSIS — Z7902 Long term (current) use of antithrombotics/antiplatelets: Secondary | ICD-10-CM | POA: Diagnosis not present

## 2018-07-19 DIAGNOSIS — C50919 Malignant neoplasm of unspecified site of unspecified female breast: Secondary | ICD-10-CM | POA: Diagnosis not present

## 2018-07-19 DIAGNOSIS — Z794 Long term (current) use of insulin: Secondary | ICD-10-CM | POA: Diagnosis not present

## 2018-07-19 DIAGNOSIS — E1151 Type 2 diabetes mellitus with diabetic peripheral angiopathy without gangrene: Secondary | ICD-10-CM | POA: Diagnosis not present

## 2018-07-19 DIAGNOSIS — I130 Hypertensive heart and chronic kidney disease with heart failure and stage 1 through stage 4 chronic kidney disease, or unspecified chronic kidney disease: Secondary | ICD-10-CM | POA: Diagnosis not present

## 2018-07-19 DIAGNOSIS — Z9981 Dependence on supplemental oxygen: Secondary | ICD-10-CM | POA: Diagnosis not present

## 2018-07-19 DIAGNOSIS — E1122 Type 2 diabetes mellitus with diabetic chronic kidney disease: Secondary | ICD-10-CM | POA: Diagnosis not present

## 2018-07-19 DIAGNOSIS — L97229 Non-pressure chronic ulcer of left calf with unspecified severity: Secondary | ICD-10-CM | POA: Diagnosis not present

## 2018-07-19 DIAGNOSIS — Z87440 Personal history of urinary (tract) infections: Secondary | ICD-10-CM | POA: Diagnosis not present

## 2018-07-19 DIAGNOSIS — Z7982 Long term (current) use of aspirin: Secondary | ICD-10-CM | POA: Diagnosis not present

## 2018-07-19 DIAGNOSIS — Z4800 Encounter for change or removal of nonsurgical wound dressing: Secondary | ICD-10-CM | POA: Diagnosis not present

## 2018-07-19 DIAGNOSIS — I272 Pulmonary hypertension, unspecified: Secondary | ICD-10-CM | POA: Diagnosis not present

## 2018-07-19 DIAGNOSIS — I5030 Unspecified diastolic (congestive) heart failure: Secondary | ICD-10-CM | POA: Diagnosis not present

## 2018-07-22 DIAGNOSIS — E785 Hyperlipidemia, unspecified: Secondary | ICD-10-CM | POA: Diagnosis not present

## 2018-07-22 DIAGNOSIS — E1151 Type 2 diabetes mellitus with diabetic peripheral angiopathy without gangrene: Secondary | ICD-10-CM | POA: Diagnosis not present

## 2018-07-22 DIAGNOSIS — L97229 Non-pressure chronic ulcer of left calf with unspecified severity: Secondary | ICD-10-CM | POA: Diagnosis not present

## 2018-07-22 DIAGNOSIS — J9610 Chronic respiratory failure, unspecified whether with hypoxia or hypercapnia: Secondary | ICD-10-CM | POA: Diagnosis not present

## 2018-07-22 DIAGNOSIS — Z87440 Personal history of urinary (tract) infections: Secondary | ICD-10-CM | POA: Diagnosis not present

## 2018-07-22 DIAGNOSIS — L97829 Non-pressure chronic ulcer of other part of left lower leg with unspecified severity: Secondary | ICD-10-CM | POA: Diagnosis not present

## 2018-07-22 DIAGNOSIS — I872 Venous insufficiency (chronic) (peripheral): Secondary | ICD-10-CM | POA: Diagnosis not present

## 2018-07-22 DIAGNOSIS — D631 Anemia in chronic kidney disease: Secondary | ICD-10-CM | POA: Diagnosis not present

## 2018-07-22 DIAGNOSIS — E1122 Type 2 diabetes mellitus with diabetic chronic kidney disease: Secondary | ICD-10-CM | POA: Diagnosis not present

## 2018-07-22 DIAGNOSIS — J449 Chronic obstructive pulmonary disease, unspecified: Secondary | ICD-10-CM | POA: Diagnosis not present

## 2018-07-22 DIAGNOSIS — Z4800 Encounter for change or removal of nonsurgical wound dressing: Secondary | ICD-10-CM | POA: Diagnosis not present

## 2018-07-22 DIAGNOSIS — I5030 Unspecified diastolic (congestive) heart failure: Secondary | ICD-10-CM | POA: Diagnosis not present

## 2018-07-22 DIAGNOSIS — Z7902 Long term (current) use of antithrombotics/antiplatelets: Secondary | ICD-10-CM | POA: Diagnosis not present

## 2018-07-22 DIAGNOSIS — C50919 Malignant neoplasm of unspecified site of unspecified female breast: Secondary | ICD-10-CM | POA: Diagnosis not present

## 2018-07-22 DIAGNOSIS — L97819 Non-pressure chronic ulcer of other part of right lower leg with unspecified severity: Secondary | ICD-10-CM | POA: Diagnosis not present

## 2018-07-22 DIAGNOSIS — Z7982 Long term (current) use of aspirin: Secondary | ICD-10-CM | POA: Diagnosis not present

## 2018-07-22 DIAGNOSIS — N189 Chronic kidney disease, unspecified: Secondary | ICD-10-CM | POA: Diagnosis not present

## 2018-07-22 DIAGNOSIS — Z794 Long term (current) use of insulin: Secondary | ICD-10-CM | POA: Diagnosis not present

## 2018-07-22 DIAGNOSIS — I130 Hypertensive heart and chronic kidney disease with heart failure and stage 1 through stage 4 chronic kidney disease, or unspecified chronic kidney disease: Secondary | ICD-10-CM | POA: Diagnosis not present

## 2018-07-22 DIAGNOSIS — I272 Pulmonary hypertension, unspecified: Secondary | ICD-10-CM | POA: Diagnosis not present

## 2018-07-22 DIAGNOSIS — Z9981 Dependence on supplemental oxygen: Secondary | ICD-10-CM | POA: Diagnosis not present

## 2018-07-23 DIAGNOSIS — J449 Chronic obstructive pulmonary disease, unspecified: Secondary | ICD-10-CM | POA: Diagnosis not present

## 2018-07-23 DIAGNOSIS — Z87440 Personal history of urinary (tract) infections: Secondary | ICD-10-CM | POA: Diagnosis not present

## 2018-07-23 DIAGNOSIS — J9610 Chronic respiratory failure, unspecified whether with hypoxia or hypercapnia: Secondary | ICD-10-CM | POA: Diagnosis not present

## 2018-07-23 DIAGNOSIS — Z7902 Long term (current) use of antithrombotics/antiplatelets: Secondary | ICD-10-CM | POA: Diagnosis not present

## 2018-07-23 DIAGNOSIS — I272 Pulmonary hypertension, unspecified: Secondary | ICD-10-CM | POA: Diagnosis not present

## 2018-07-23 DIAGNOSIS — I5030 Unspecified diastolic (congestive) heart failure: Secondary | ICD-10-CM | POA: Diagnosis not present

## 2018-07-23 DIAGNOSIS — I130 Hypertensive heart and chronic kidney disease with heart failure and stage 1 through stage 4 chronic kidney disease, or unspecified chronic kidney disease: Secondary | ICD-10-CM | POA: Diagnosis not present

## 2018-07-23 DIAGNOSIS — E1122 Type 2 diabetes mellitus with diabetic chronic kidney disease: Secondary | ICD-10-CM | POA: Diagnosis not present

## 2018-07-23 DIAGNOSIS — L97819 Non-pressure chronic ulcer of other part of right lower leg with unspecified severity: Secondary | ICD-10-CM | POA: Diagnosis not present

## 2018-07-23 DIAGNOSIS — N189 Chronic kidney disease, unspecified: Secondary | ICD-10-CM | POA: Diagnosis not present

## 2018-07-23 DIAGNOSIS — L97829 Non-pressure chronic ulcer of other part of left lower leg with unspecified severity: Secondary | ICD-10-CM | POA: Diagnosis not present

## 2018-07-23 DIAGNOSIS — Z9981 Dependence on supplemental oxygen: Secondary | ICD-10-CM | POA: Diagnosis not present

## 2018-07-23 DIAGNOSIS — E1151 Type 2 diabetes mellitus with diabetic peripheral angiopathy without gangrene: Secondary | ICD-10-CM | POA: Diagnosis not present

## 2018-07-23 DIAGNOSIS — L97229 Non-pressure chronic ulcer of left calf with unspecified severity: Secondary | ICD-10-CM | POA: Diagnosis not present

## 2018-07-23 DIAGNOSIS — E785 Hyperlipidemia, unspecified: Secondary | ICD-10-CM | POA: Diagnosis not present

## 2018-07-23 DIAGNOSIS — C50919 Malignant neoplasm of unspecified site of unspecified female breast: Secondary | ICD-10-CM | POA: Diagnosis not present

## 2018-07-23 DIAGNOSIS — Z4800 Encounter for change or removal of nonsurgical wound dressing: Secondary | ICD-10-CM | POA: Diagnosis not present

## 2018-07-23 DIAGNOSIS — Z7982 Long term (current) use of aspirin: Secondary | ICD-10-CM | POA: Diagnosis not present

## 2018-07-23 DIAGNOSIS — I872 Venous insufficiency (chronic) (peripheral): Secondary | ICD-10-CM | POA: Diagnosis not present

## 2018-07-23 DIAGNOSIS — Z794 Long term (current) use of insulin: Secondary | ICD-10-CM | POA: Diagnosis not present

## 2018-07-23 DIAGNOSIS — D631 Anemia in chronic kidney disease: Secondary | ICD-10-CM | POA: Diagnosis not present

## 2018-07-25 DIAGNOSIS — Z4800 Encounter for change or removal of nonsurgical wound dressing: Secondary | ICD-10-CM | POA: Diagnosis not present

## 2018-07-25 DIAGNOSIS — E785 Hyperlipidemia, unspecified: Secondary | ICD-10-CM | POA: Diagnosis not present

## 2018-07-25 DIAGNOSIS — Z9981 Dependence on supplemental oxygen: Secondary | ICD-10-CM | POA: Diagnosis not present

## 2018-07-25 DIAGNOSIS — D631 Anemia in chronic kidney disease: Secondary | ICD-10-CM | POA: Diagnosis not present

## 2018-07-25 DIAGNOSIS — C50919 Malignant neoplasm of unspecified site of unspecified female breast: Secondary | ICD-10-CM | POA: Diagnosis not present

## 2018-07-25 DIAGNOSIS — E1151 Type 2 diabetes mellitus with diabetic peripheral angiopathy without gangrene: Secondary | ICD-10-CM | POA: Diagnosis not present

## 2018-07-25 DIAGNOSIS — Z7902 Long term (current) use of antithrombotics/antiplatelets: Secondary | ICD-10-CM | POA: Diagnosis not present

## 2018-07-25 DIAGNOSIS — J9610 Chronic respiratory failure, unspecified whether with hypoxia or hypercapnia: Secondary | ICD-10-CM | POA: Diagnosis not present

## 2018-07-25 DIAGNOSIS — E1122 Type 2 diabetes mellitus with diabetic chronic kidney disease: Secondary | ICD-10-CM | POA: Diagnosis not present

## 2018-07-25 DIAGNOSIS — I130 Hypertensive heart and chronic kidney disease with heart failure and stage 1 through stage 4 chronic kidney disease, or unspecified chronic kidney disease: Secondary | ICD-10-CM | POA: Diagnosis not present

## 2018-07-25 DIAGNOSIS — J449 Chronic obstructive pulmonary disease, unspecified: Secondary | ICD-10-CM | POA: Diagnosis not present

## 2018-07-25 DIAGNOSIS — L97829 Non-pressure chronic ulcer of other part of left lower leg with unspecified severity: Secondary | ICD-10-CM | POA: Diagnosis not present

## 2018-07-25 DIAGNOSIS — I5030 Unspecified diastolic (congestive) heart failure: Secondary | ICD-10-CM | POA: Diagnosis not present

## 2018-07-25 DIAGNOSIS — Z794 Long term (current) use of insulin: Secondary | ICD-10-CM | POA: Diagnosis not present

## 2018-07-25 DIAGNOSIS — I872 Venous insufficiency (chronic) (peripheral): Secondary | ICD-10-CM | POA: Diagnosis not present

## 2018-07-25 DIAGNOSIS — N189 Chronic kidney disease, unspecified: Secondary | ICD-10-CM | POA: Diagnosis not present

## 2018-07-25 DIAGNOSIS — I272 Pulmonary hypertension, unspecified: Secondary | ICD-10-CM | POA: Diagnosis not present

## 2018-07-25 DIAGNOSIS — L97229 Non-pressure chronic ulcer of left calf with unspecified severity: Secondary | ICD-10-CM | POA: Diagnosis not present

## 2018-07-25 DIAGNOSIS — Z87440 Personal history of urinary (tract) infections: Secondary | ICD-10-CM | POA: Diagnosis not present

## 2018-07-25 DIAGNOSIS — Z7982 Long term (current) use of aspirin: Secondary | ICD-10-CM | POA: Diagnosis not present

## 2018-07-25 DIAGNOSIS — L97819 Non-pressure chronic ulcer of other part of right lower leg with unspecified severity: Secondary | ICD-10-CM | POA: Diagnosis not present

## 2018-07-29 DIAGNOSIS — I272 Pulmonary hypertension, unspecified: Secondary | ICD-10-CM | POA: Diagnosis not present

## 2018-07-29 DIAGNOSIS — D631 Anemia in chronic kidney disease: Secondary | ICD-10-CM | POA: Diagnosis not present

## 2018-07-29 DIAGNOSIS — Z4800 Encounter for change or removal of nonsurgical wound dressing: Secondary | ICD-10-CM | POA: Diagnosis not present

## 2018-07-29 DIAGNOSIS — Z7902 Long term (current) use of antithrombotics/antiplatelets: Secondary | ICD-10-CM | POA: Diagnosis not present

## 2018-07-29 DIAGNOSIS — I872 Venous insufficiency (chronic) (peripheral): Secondary | ICD-10-CM | POA: Diagnosis not present

## 2018-07-29 DIAGNOSIS — Z794 Long term (current) use of insulin: Secondary | ICD-10-CM | POA: Diagnosis not present

## 2018-07-29 DIAGNOSIS — C50919 Malignant neoplasm of unspecified site of unspecified female breast: Secondary | ICD-10-CM | POA: Diagnosis not present

## 2018-07-29 DIAGNOSIS — Z9981 Dependence on supplemental oxygen: Secondary | ICD-10-CM | POA: Diagnosis not present

## 2018-07-29 DIAGNOSIS — L97229 Non-pressure chronic ulcer of left calf with unspecified severity: Secondary | ICD-10-CM | POA: Diagnosis not present

## 2018-07-29 DIAGNOSIS — I130 Hypertensive heart and chronic kidney disease with heart failure and stage 1 through stage 4 chronic kidney disease, or unspecified chronic kidney disease: Secondary | ICD-10-CM | POA: Diagnosis not present

## 2018-07-29 DIAGNOSIS — Z87440 Personal history of urinary (tract) infections: Secondary | ICD-10-CM | POA: Diagnosis not present

## 2018-07-29 DIAGNOSIS — J9610 Chronic respiratory failure, unspecified whether with hypoxia or hypercapnia: Secondary | ICD-10-CM | POA: Diagnosis not present

## 2018-07-29 DIAGNOSIS — E1122 Type 2 diabetes mellitus with diabetic chronic kidney disease: Secondary | ICD-10-CM | POA: Diagnosis not present

## 2018-07-29 DIAGNOSIS — L97829 Non-pressure chronic ulcer of other part of left lower leg with unspecified severity: Secondary | ICD-10-CM | POA: Diagnosis not present

## 2018-07-29 DIAGNOSIS — J449 Chronic obstructive pulmonary disease, unspecified: Secondary | ICD-10-CM | POA: Diagnosis not present

## 2018-07-29 DIAGNOSIS — E1151 Type 2 diabetes mellitus with diabetic peripheral angiopathy without gangrene: Secondary | ICD-10-CM | POA: Diagnosis not present

## 2018-07-29 DIAGNOSIS — N189 Chronic kidney disease, unspecified: Secondary | ICD-10-CM | POA: Diagnosis not present

## 2018-07-29 DIAGNOSIS — Z7982 Long term (current) use of aspirin: Secondary | ICD-10-CM | POA: Diagnosis not present

## 2018-07-29 DIAGNOSIS — E785 Hyperlipidemia, unspecified: Secondary | ICD-10-CM | POA: Diagnosis not present

## 2018-07-29 DIAGNOSIS — I5030 Unspecified diastolic (congestive) heart failure: Secondary | ICD-10-CM | POA: Diagnosis not present

## 2018-07-29 DIAGNOSIS — L97819 Non-pressure chronic ulcer of other part of right lower leg with unspecified severity: Secondary | ICD-10-CM | POA: Diagnosis not present

## 2018-07-30 DIAGNOSIS — Z9981 Dependence on supplemental oxygen: Secondary | ICD-10-CM | POA: Diagnosis not present

## 2018-07-30 DIAGNOSIS — Z87440 Personal history of urinary (tract) infections: Secondary | ICD-10-CM | POA: Diagnosis not present

## 2018-07-30 DIAGNOSIS — I872 Venous insufficiency (chronic) (peripheral): Secondary | ICD-10-CM | POA: Diagnosis not present

## 2018-07-30 DIAGNOSIS — J449 Chronic obstructive pulmonary disease, unspecified: Secondary | ICD-10-CM | POA: Diagnosis not present

## 2018-07-30 DIAGNOSIS — I272 Pulmonary hypertension, unspecified: Secondary | ICD-10-CM | POA: Diagnosis not present

## 2018-07-30 DIAGNOSIS — C50919 Malignant neoplasm of unspecified site of unspecified female breast: Secondary | ICD-10-CM | POA: Diagnosis not present

## 2018-07-30 DIAGNOSIS — I5030 Unspecified diastolic (congestive) heart failure: Secondary | ICD-10-CM | POA: Diagnosis not present

## 2018-07-30 DIAGNOSIS — Z7982 Long term (current) use of aspirin: Secondary | ICD-10-CM | POA: Diagnosis not present

## 2018-07-30 DIAGNOSIS — J9610 Chronic respiratory failure, unspecified whether with hypoxia or hypercapnia: Secondary | ICD-10-CM | POA: Diagnosis not present

## 2018-07-30 DIAGNOSIS — L97829 Non-pressure chronic ulcer of other part of left lower leg with unspecified severity: Secondary | ICD-10-CM | POA: Diagnosis not present

## 2018-07-30 DIAGNOSIS — I130 Hypertensive heart and chronic kidney disease with heart failure and stage 1 through stage 4 chronic kidney disease, or unspecified chronic kidney disease: Secondary | ICD-10-CM | POA: Diagnosis not present

## 2018-07-30 DIAGNOSIS — Z7902 Long term (current) use of antithrombotics/antiplatelets: Secondary | ICD-10-CM | POA: Diagnosis not present

## 2018-07-30 DIAGNOSIS — Z794 Long term (current) use of insulin: Secondary | ICD-10-CM | POA: Diagnosis not present

## 2018-07-30 DIAGNOSIS — L97819 Non-pressure chronic ulcer of other part of right lower leg with unspecified severity: Secondary | ICD-10-CM | POA: Diagnosis not present

## 2018-07-30 DIAGNOSIS — E1151 Type 2 diabetes mellitus with diabetic peripheral angiopathy without gangrene: Secondary | ICD-10-CM | POA: Diagnosis not present

## 2018-07-30 DIAGNOSIS — L97229 Non-pressure chronic ulcer of left calf with unspecified severity: Secondary | ICD-10-CM | POA: Diagnosis not present

## 2018-07-30 DIAGNOSIS — D631 Anemia in chronic kidney disease: Secondary | ICD-10-CM | POA: Diagnosis not present

## 2018-07-30 DIAGNOSIS — Z4800 Encounter for change or removal of nonsurgical wound dressing: Secondary | ICD-10-CM | POA: Diagnosis not present

## 2018-07-30 DIAGNOSIS — E1122 Type 2 diabetes mellitus with diabetic chronic kidney disease: Secondary | ICD-10-CM | POA: Diagnosis not present

## 2018-07-30 DIAGNOSIS — E785 Hyperlipidemia, unspecified: Secondary | ICD-10-CM | POA: Diagnosis not present

## 2018-07-30 DIAGNOSIS — N189 Chronic kidney disease, unspecified: Secondary | ICD-10-CM | POA: Diagnosis not present

## 2018-08-01 DIAGNOSIS — I130 Hypertensive heart and chronic kidney disease with heart failure and stage 1 through stage 4 chronic kidney disease, or unspecified chronic kidney disease: Secondary | ICD-10-CM | POA: Diagnosis not present

## 2018-08-01 DIAGNOSIS — I872 Venous insufficiency (chronic) (peripheral): Secondary | ICD-10-CM | POA: Diagnosis not present

## 2018-08-01 DIAGNOSIS — Z4800 Encounter for change or removal of nonsurgical wound dressing: Secondary | ICD-10-CM | POA: Diagnosis not present

## 2018-08-01 DIAGNOSIS — E1151 Type 2 diabetes mellitus with diabetic peripheral angiopathy without gangrene: Secondary | ICD-10-CM | POA: Diagnosis not present

## 2018-08-01 DIAGNOSIS — J449 Chronic obstructive pulmonary disease, unspecified: Secondary | ICD-10-CM | POA: Diagnosis not present

## 2018-08-01 DIAGNOSIS — Z7982 Long term (current) use of aspirin: Secondary | ICD-10-CM | POA: Diagnosis not present

## 2018-08-01 DIAGNOSIS — L97229 Non-pressure chronic ulcer of left calf with unspecified severity: Secondary | ICD-10-CM | POA: Diagnosis not present

## 2018-08-01 DIAGNOSIS — C50919 Malignant neoplasm of unspecified site of unspecified female breast: Secondary | ICD-10-CM | POA: Diagnosis not present

## 2018-08-01 DIAGNOSIS — N189 Chronic kidney disease, unspecified: Secondary | ICD-10-CM | POA: Diagnosis not present

## 2018-08-01 DIAGNOSIS — I272 Pulmonary hypertension, unspecified: Secondary | ICD-10-CM | POA: Diagnosis not present

## 2018-08-01 DIAGNOSIS — Z87440 Personal history of urinary (tract) infections: Secondary | ICD-10-CM | POA: Diagnosis not present

## 2018-08-01 DIAGNOSIS — Z9981 Dependence on supplemental oxygen: Secondary | ICD-10-CM | POA: Diagnosis not present

## 2018-08-01 DIAGNOSIS — Z794 Long term (current) use of insulin: Secondary | ICD-10-CM | POA: Diagnosis not present

## 2018-08-01 DIAGNOSIS — E1122 Type 2 diabetes mellitus with diabetic chronic kidney disease: Secondary | ICD-10-CM | POA: Diagnosis not present

## 2018-08-01 DIAGNOSIS — Z7902 Long term (current) use of antithrombotics/antiplatelets: Secondary | ICD-10-CM | POA: Diagnosis not present

## 2018-08-01 DIAGNOSIS — I5030 Unspecified diastolic (congestive) heart failure: Secondary | ICD-10-CM | POA: Diagnosis not present

## 2018-08-01 DIAGNOSIS — J9610 Chronic respiratory failure, unspecified whether with hypoxia or hypercapnia: Secondary | ICD-10-CM | POA: Diagnosis not present

## 2018-08-01 DIAGNOSIS — L97829 Non-pressure chronic ulcer of other part of left lower leg with unspecified severity: Secondary | ICD-10-CM | POA: Diagnosis not present

## 2018-08-01 DIAGNOSIS — E785 Hyperlipidemia, unspecified: Secondary | ICD-10-CM | POA: Diagnosis not present

## 2018-08-01 DIAGNOSIS — L97819 Non-pressure chronic ulcer of other part of right lower leg with unspecified severity: Secondary | ICD-10-CM | POA: Diagnosis not present

## 2018-08-01 DIAGNOSIS — D631 Anemia in chronic kidney disease: Secondary | ICD-10-CM | POA: Diagnosis not present

## 2018-08-06 DIAGNOSIS — L97229 Non-pressure chronic ulcer of left calf with unspecified severity: Secondary | ICD-10-CM | POA: Diagnosis not present

## 2018-08-06 DIAGNOSIS — E785 Hyperlipidemia, unspecified: Secondary | ICD-10-CM | POA: Diagnosis not present

## 2018-08-06 DIAGNOSIS — L97829 Non-pressure chronic ulcer of other part of left lower leg with unspecified severity: Secondary | ICD-10-CM | POA: Diagnosis not present

## 2018-08-06 DIAGNOSIS — N189 Chronic kidney disease, unspecified: Secondary | ICD-10-CM | POA: Diagnosis not present

## 2018-08-06 DIAGNOSIS — I130 Hypertensive heart and chronic kidney disease with heart failure and stage 1 through stage 4 chronic kidney disease, or unspecified chronic kidney disease: Secondary | ICD-10-CM | POA: Diagnosis not present

## 2018-08-06 DIAGNOSIS — Z7982 Long term (current) use of aspirin: Secondary | ICD-10-CM | POA: Diagnosis not present

## 2018-08-06 DIAGNOSIS — D631 Anemia in chronic kidney disease: Secondary | ICD-10-CM | POA: Diagnosis not present

## 2018-08-06 DIAGNOSIS — I872 Venous insufficiency (chronic) (peripheral): Secondary | ICD-10-CM | POA: Diagnosis not present

## 2018-08-06 DIAGNOSIS — J449 Chronic obstructive pulmonary disease, unspecified: Secondary | ICD-10-CM | POA: Diagnosis not present

## 2018-08-06 DIAGNOSIS — J9610 Chronic respiratory failure, unspecified whether with hypoxia or hypercapnia: Secondary | ICD-10-CM | POA: Diagnosis not present

## 2018-08-06 DIAGNOSIS — I5030 Unspecified diastolic (congestive) heart failure: Secondary | ICD-10-CM | POA: Diagnosis not present

## 2018-08-06 DIAGNOSIS — E1122 Type 2 diabetes mellitus with diabetic chronic kidney disease: Secondary | ICD-10-CM | POA: Diagnosis not present

## 2018-08-06 DIAGNOSIS — I272 Pulmonary hypertension, unspecified: Secondary | ICD-10-CM | POA: Diagnosis not present

## 2018-08-06 DIAGNOSIS — Z4800 Encounter for change or removal of nonsurgical wound dressing: Secondary | ICD-10-CM | POA: Diagnosis not present

## 2018-08-06 DIAGNOSIS — Z794 Long term (current) use of insulin: Secondary | ICD-10-CM | POA: Diagnosis not present

## 2018-08-06 DIAGNOSIS — C50919 Malignant neoplasm of unspecified site of unspecified female breast: Secondary | ICD-10-CM | POA: Diagnosis not present

## 2018-08-06 DIAGNOSIS — Z7902 Long term (current) use of antithrombotics/antiplatelets: Secondary | ICD-10-CM | POA: Diagnosis not present

## 2018-08-06 DIAGNOSIS — Z87440 Personal history of urinary (tract) infections: Secondary | ICD-10-CM | POA: Diagnosis not present

## 2018-08-06 DIAGNOSIS — L97819 Non-pressure chronic ulcer of other part of right lower leg with unspecified severity: Secondary | ICD-10-CM | POA: Diagnosis not present

## 2018-08-06 DIAGNOSIS — E1151 Type 2 diabetes mellitus with diabetic peripheral angiopathy without gangrene: Secondary | ICD-10-CM | POA: Diagnosis not present

## 2018-08-06 DIAGNOSIS — Z9981 Dependence on supplemental oxygen: Secondary | ICD-10-CM | POA: Diagnosis not present

## 2018-08-09 DIAGNOSIS — R062 Wheezing: Secondary | ICD-10-CM | POA: Diagnosis not present

## 2018-08-09 DIAGNOSIS — J9690 Respiratory failure, unspecified, unspecified whether with hypoxia or hypercapnia: Secondary | ICD-10-CM | POA: Diagnosis not present

## 2018-08-09 DIAGNOSIS — A4189 Other specified sepsis: Secondary | ICD-10-CM | POA: Diagnosis not present

## 2018-08-12 DIAGNOSIS — I872 Venous insufficiency (chronic) (peripheral): Secondary | ICD-10-CM | POA: Diagnosis not present

## 2018-08-12 DIAGNOSIS — E785 Hyperlipidemia, unspecified: Secondary | ICD-10-CM | POA: Diagnosis not present

## 2018-08-12 DIAGNOSIS — L97829 Non-pressure chronic ulcer of other part of left lower leg with unspecified severity: Secondary | ICD-10-CM | POA: Diagnosis not present

## 2018-08-12 DIAGNOSIS — N189 Chronic kidney disease, unspecified: Secondary | ICD-10-CM | POA: Diagnosis not present

## 2018-08-12 DIAGNOSIS — E1122 Type 2 diabetes mellitus with diabetic chronic kidney disease: Secondary | ICD-10-CM | POA: Diagnosis not present

## 2018-08-12 DIAGNOSIS — C50919 Malignant neoplasm of unspecified site of unspecified female breast: Secondary | ICD-10-CM | POA: Diagnosis not present

## 2018-08-12 DIAGNOSIS — Z9981 Dependence on supplemental oxygen: Secondary | ICD-10-CM | POA: Diagnosis not present

## 2018-08-12 DIAGNOSIS — Z7982 Long term (current) use of aspirin: Secondary | ICD-10-CM | POA: Diagnosis not present

## 2018-08-12 DIAGNOSIS — Z87440 Personal history of urinary (tract) infections: Secondary | ICD-10-CM | POA: Diagnosis not present

## 2018-08-12 DIAGNOSIS — Z7902 Long term (current) use of antithrombotics/antiplatelets: Secondary | ICD-10-CM | POA: Diagnosis not present

## 2018-08-12 DIAGNOSIS — I272 Pulmonary hypertension, unspecified: Secondary | ICD-10-CM | POA: Diagnosis not present

## 2018-08-12 DIAGNOSIS — J9610 Chronic respiratory failure, unspecified whether with hypoxia or hypercapnia: Secondary | ICD-10-CM | POA: Diagnosis not present

## 2018-08-12 DIAGNOSIS — E1151 Type 2 diabetes mellitus with diabetic peripheral angiopathy without gangrene: Secondary | ICD-10-CM | POA: Diagnosis not present

## 2018-08-12 DIAGNOSIS — L97819 Non-pressure chronic ulcer of other part of right lower leg with unspecified severity: Secondary | ICD-10-CM | POA: Diagnosis not present

## 2018-08-12 DIAGNOSIS — J449 Chronic obstructive pulmonary disease, unspecified: Secondary | ICD-10-CM | POA: Diagnosis not present

## 2018-08-12 DIAGNOSIS — I130 Hypertensive heart and chronic kidney disease with heart failure and stage 1 through stage 4 chronic kidney disease, or unspecified chronic kidney disease: Secondary | ICD-10-CM | POA: Diagnosis not present

## 2018-08-12 DIAGNOSIS — L97229 Non-pressure chronic ulcer of left calf with unspecified severity: Secondary | ICD-10-CM | POA: Diagnosis not present

## 2018-08-12 DIAGNOSIS — Z4800 Encounter for change or removal of nonsurgical wound dressing: Secondary | ICD-10-CM | POA: Diagnosis not present

## 2018-08-12 DIAGNOSIS — D631 Anemia in chronic kidney disease: Secondary | ICD-10-CM | POA: Diagnosis not present

## 2018-08-12 DIAGNOSIS — Z794 Long term (current) use of insulin: Secondary | ICD-10-CM | POA: Diagnosis not present

## 2018-08-12 DIAGNOSIS — I5030 Unspecified diastolic (congestive) heart failure: Secondary | ICD-10-CM | POA: Diagnosis not present

## 2018-08-15 DIAGNOSIS — I5032 Chronic diastolic (congestive) heart failure: Secondary | ICD-10-CM | POA: Diagnosis not present

## 2018-08-16 DIAGNOSIS — Z7982 Long term (current) use of aspirin: Secondary | ICD-10-CM | POA: Diagnosis not present

## 2018-08-16 DIAGNOSIS — L97229 Non-pressure chronic ulcer of left calf with unspecified severity: Secondary | ICD-10-CM | POA: Diagnosis not present

## 2018-08-16 DIAGNOSIS — I130 Hypertensive heart and chronic kidney disease with heart failure and stage 1 through stage 4 chronic kidney disease, or unspecified chronic kidney disease: Secondary | ICD-10-CM | POA: Diagnosis not present

## 2018-08-16 DIAGNOSIS — I872 Venous insufficiency (chronic) (peripheral): Secondary | ICD-10-CM | POA: Diagnosis not present

## 2018-08-16 DIAGNOSIS — Z87440 Personal history of urinary (tract) infections: Secondary | ICD-10-CM | POA: Diagnosis not present

## 2018-08-16 DIAGNOSIS — Z9981 Dependence on supplemental oxygen: Secondary | ICD-10-CM | POA: Diagnosis not present

## 2018-08-16 DIAGNOSIS — L97819 Non-pressure chronic ulcer of other part of right lower leg with unspecified severity: Secondary | ICD-10-CM | POA: Diagnosis not present

## 2018-08-16 DIAGNOSIS — Z794 Long term (current) use of insulin: Secondary | ICD-10-CM | POA: Diagnosis not present

## 2018-08-16 DIAGNOSIS — L97829 Non-pressure chronic ulcer of other part of left lower leg with unspecified severity: Secondary | ICD-10-CM | POA: Diagnosis not present

## 2018-08-16 DIAGNOSIS — Z4800 Encounter for change or removal of nonsurgical wound dressing: Secondary | ICD-10-CM | POA: Diagnosis not present

## 2018-08-16 DIAGNOSIS — C50919 Malignant neoplasm of unspecified site of unspecified female breast: Secondary | ICD-10-CM | POA: Diagnosis not present

## 2018-08-16 DIAGNOSIS — Z7902 Long term (current) use of antithrombotics/antiplatelets: Secondary | ICD-10-CM | POA: Diagnosis not present

## 2018-08-16 DIAGNOSIS — J449 Chronic obstructive pulmonary disease, unspecified: Secondary | ICD-10-CM | POA: Diagnosis not present

## 2018-08-16 DIAGNOSIS — D631 Anemia in chronic kidney disease: Secondary | ICD-10-CM | POA: Diagnosis not present

## 2018-08-16 DIAGNOSIS — J9610 Chronic respiratory failure, unspecified whether with hypoxia or hypercapnia: Secondary | ICD-10-CM | POA: Diagnosis not present

## 2018-08-16 DIAGNOSIS — E785 Hyperlipidemia, unspecified: Secondary | ICD-10-CM | POA: Diagnosis not present

## 2018-08-16 DIAGNOSIS — I272 Pulmonary hypertension, unspecified: Secondary | ICD-10-CM | POA: Diagnosis not present

## 2018-08-16 DIAGNOSIS — E1122 Type 2 diabetes mellitus with diabetic chronic kidney disease: Secondary | ICD-10-CM | POA: Diagnosis not present

## 2018-08-16 DIAGNOSIS — I5030 Unspecified diastolic (congestive) heart failure: Secondary | ICD-10-CM | POA: Diagnosis not present

## 2018-08-16 DIAGNOSIS — N189 Chronic kidney disease, unspecified: Secondary | ICD-10-CM | POA: Diagnosis not present

## 2018-08-16 DIAGNOSIS — E1151 Type 2 diabetes mellitus with diabetic peripheral angiopathy without gangrene: Secondary | ICD-10-CM | POA: Diagnosis not present

## 2018-08-21 DIAGNOSIS — E1151 Type 2 diabetes mellitus with diabetic peripheral angiopathy without gangrene: Secondary | ICD-10-CM | POA: Diagnosis not present

## 2018-08-21 DIAGNOSIS — Z794 Long term (current) use of insulin: Secondary | ICD-10-CM | POA: Diagnosis not present

## 2018-08-21 DIAGNOSIS — L97829 Non-pressure chronic ulcer of other part of left lower leg with unspecified severity: Secondary | ICD-10-CM | POA: Diagnosis not present

## 2018-08-21 DIAGNOSIS — J449 Chronic obstructive pulmonary disease, unspecified: Secondary | ICD-10-CM | POA: Diagnosis not present

## 2018-08-21 DIAGNOSIS — N189 Chronic kidney disease, unspecified: Secondary | ICD-10-CM | POA: Diagnosis not present

## 2018-08-21 DIAGNOSIS — L97819 Non-pressure chronic ulcer of other part of right lower leg with unspecified severity: Secondary | ICD-10-CM | POA: Diagnosis not present

## 2018-08-21 DIAGNOSIS — Z7982 Long term (current) use of aspirin: Secondary | ICD-10-CM | POA: Diagnosis not present

## 2018-08-21 DIAGNOSIS — J9610 Chronic respiratory failure, unspecified whether with hypoxia or hypercapnia: Secondary | ICD-10-CM | POA: Diagnosis not present

## 2018-08-21 DIAGNOSIS — D631 Anemia in chronic kidney disease: Secondary | ICD-10-CM | POA: Diagnosis not present

## 2018-08-21 DIAGNOSIS — Z87440 Personal history of urinary (tract) infections: Secondary | ICD-10-CM | POA: Diagnosis not present

## 2018-08-21 DIAGNOSIS — I130 Hypertensive heart and chronic kidney disease with heart failure and stage 1 through stage 4 chronic kidney disease, or unspecified chronic kidney disease: Secondary | ICD-10-CM | POA: Diagnosis not present

## 2018-08-21 DIAGNOSIS — Z4800 Encounter for change or removal of nonsurgical wound dressing: Secondary | ICD-10-CM | POA: Diagnosis not present

## 2018-08-21 DIAGNOSIS — E1122 Type 2 diabetes mellitus with diabetic chronic kidney disease: Secondary | ICD-10-CM | POA: Diagnosis not present

## 2018-08-21 DIAGNOSIS — I272 Pulmonary hypertension, unspecified: Secondary | ICD-10-CM | POA: Diagnosis not present

## 2018-08-21 DIAGNOSIS — E785 Hyperlipidemia, unspecified: Secondary | ICD-10-CM | POA: Diagnosis not present

## 2018-08-21 DIAGNOSIS — Z9981 Dependence on supplemental oxygen: Secondary | ICD-10-CM | POA: Diagnosis not present

## 2018-08-21 DIAGNOSIS — L97229 Non-pressure chronic ulcer of left calf with unspecified severity: Secondary | ICD-10-CM | POA: Diagnosis not present

## 2018-08-21 DIAGNOSIS — I5030 Unspecified diastolic (congestive) heart failure: Secondary | ICD-10-CM | POA: Diagnosis not present

## 2018-08-21 DIAGNOSIS — Z7902 Long term (current) use of antithrombotics/antiplatelets: Secondary | ICD-10-CM | POA: Diagnosis not present

## 2018-08-21 DIAGNOSIS — I872 Venous insufficiency (chronic) (peripheral): Secondary | ICD-10-CM | POA: Diagnosis not present

## 2018-08-21 DIAGNOSIS — C50919 Malignant neoplasm of unspecified site of unspecified female breast: Secondary | ICD-10-CM | POA: Diagnosis not present

## 2018-08-28 DIAGNOSIS — I130 Hypertensive heart and chronic kidney disease with heart failure and stage 1 through stage 4 chronic kidney disease, or unspecified chronic kidney disease: Secondary | ICD-10-CM | POA: Diagnosis not present

## 2018-08-28 DIAGNOSIS — D631 Anemia in chronic kidney disease: Secondary | ICD-10-CM | POA: Diagnosis not present

## 2018-08-28 DIAGNOSIS — Z9981 Dependence on supplemental oxygen: Secondary | ICD-10-CM | POA: Diagnosis not present

## 2018-08-28 DIAGNOSIS — E1122 Type 2 diabetes mellitus with diabetic chronic kidney disease: Secondary | ICD-10-CM | POA: Diagnosis not present

## 2018-08-28 DIAGNOSIS — I272 Pulmonary hypertension, unspecified: Secondary | ICD-10-CM | POA: Diagnosis not present

## 2018-08-28 DIAGNOSIS — L97819 Non-pressure chronic ulcer of other part of right lower leg with unspecified severity: Secondary | ICD-10-CM | POA: Diagnosis not present

## 2018-08-28 DIAGNOSIS — L97229 Non-pressure chronic ulcer of left calf with unspecified severity: Secondary | ICD-10-CM | POA: Diagnosis not present

## 2018-08-28 DIAGNOSIS — N189 Chronic kidney disease, unspecified: Secondary | ICD-10-CM | POA: Diagnosis not present

## 2018-08-28 DIAGNOSIS — C50919 Malignant neoplasm of unspecified site of unspecified female breast: Secondary | ICD-10-CM | POA: Diagnosis not present

## 2018-08-28 DIAGNOSIS — I5030 Unspecified diastolic (congestive) heart failure: Secondary | ICD-10-CM | POA: Diagnosis not present

## 2018-08-28 DIAGNOSIS — Z4800 Encounter for change or removal of nonsurgical wound dressing: Secondary | ICD-10-CM | POA: Diagnosis not present

## 2018-08-28 DIAGNOSIS — E785 Hyperlipidemia, unspecified: Secondary | ICD-10-CM | POA: Diagnosis not present

## 2018-08-28 DIAGNOSIS — E1151 Type 2 diabetes mellitus with diabetic peripheral angiopathy without gangrene: Secondary | ICD-10-CM | POA: Diagnosis not present

## 2018-08-28 DIAGNOSIS — L97829 Non-pressure chronic ulcer of other part of left lower leg with unspecified severity: Secondary | ICD-10-CM | POA: Diagnosis not present

## 2018-08-28 DIAGNOSIS — Z7982 Long term (current) use of aspirin: Secondary | ICD-10-CM | POA: Diagnosis not present

## 2018-08-28 DIAGNOSIS — Z87440 Personal history of urinary (tract) infections: Secondary | ICD-10-CM | POA: Diagnosis not present

## 2018-08-28 DIAGNOSIS — Z7902 Long term (current) use of antithrombotics/antiplatelets: Secondary | ICD-10-CM | POA: Diagnosis not present

## 2018-08-28 DIAGNOSIS — I872 Venous insufficiency (chronic) (peripheral): Secondary | ICD-10-CM | POA: Diagnosis not present

## 2018-08-28 DIAGNOSIS — J9610 Chronic respiratory failure, unspecified whether with hypoxia or hypercapnia: Secondary | ICD-10-CM | POA: Diagnosis not present

## 2018-08-28 DIAGNOSIS — J449 Chronic obstructive pulmonary disease, unspecified: Secondary | ICD-10-CM | POA: Diagnosis not present

## 2018-08-28 DIAGNOSIS — Z794 Long term (current) use of insulin: Secondary | ICD-10-CM | POA: Diagnosis not present

## 2018-09-02 DIAGNOSIS — E1151 Type 2 diabetes mellitus with diabetic peripheral angiopathy without gangrene: Secondary | ICD-10-CM | POA: Diagnosis not present

## 2018-09-02 DIAGNOSIS — I872 Venous insufficiency (chronic) (peripheral): Secondary | ICD-10-CM | POA: Diagnosis not present

## 2018-09-02 DIAGNOSIS — Z7982 Long term (current) use of aspirin: Secondary | ICD-10-CM | POA: Diagnosis not present

## 2018-09-02 DIAGNOSIS — I272 Pulmonary hypertension, unspecified: Secondary | ICD-10-CM | POA: Diagnosis not present

## 2018-09-02 DIAGNOSIS — C50919 Malignant neoplasm of unspecified site of unspecified female breast: Secondary | ICD-10-CM | POA: Diagnosis not present

## 2018-09-02 DIAGNOSIS — E785 Hyperlipidemia, unspecified: Secondary | ICD-10-CM | POA: Diagnosis not present

## 2018-09-02 DIAGNOSIS — I130 Hypertensive heart and chronic kidney disease with heart failure and stage 1 through stage 4 chronic kidney disease, or unspecified chronic kidney disease: Secondary | ICD-10-CM | POA: Diagnosis not present

## 2018-09-02 DIAGNOSIS — Z7902 Long term (current) use of antithrombotics/antiplatelets: Secondary | ICD-10-CM | POA: Diagnosis not present

## 2018-09-02 DIAGNOSIS — Z794 Long term (current) use of insulin: Secondary | ICD-10-CM | POA: Diagnosis not present

## 2018-09-02 DIAGNOSIS — I5030 Unspecified diastolic (congestive) heart failure: Secondary | ICD-10-CM | POA: Diagnosis not present

## 2018-09-02 DIAGNOSIS — L97829 Non-pressure chronic ulcer of other part of left lower leg with unspecified severity: Secondary | ICD-10-CM | POA: Diagnosis not present

## 2018-09-02 DIAGNOSIS — Z9981 Dependence on supplemental oxygen: Secondary | ICD-10-CM | POA: Diagnosis not present

## 2018-09-02 DIAGNOSIS — Z87440 Personal history of urinary (tract) infections: Secondary | ICD-10-CM | POA: Diagnosis not present

## 2018-09-02 DIAGNOSIS — N189 Chronic kidney disease, unspecified: Secondary | ICD-10-CM | POA: Diagnosis not present

## 2018-09-02 DIAGNOSIS — D631 Anemia in chronic kidney disease: Secondary | ICD-10-CM | POA: Diagnosis not present

## 2018-09-02 DIAGNOSIS — Z4800 Encounter for change or removal of nonsurgical wound dressing: Secondary | ICD-10-CM | POA: Diagnosis not present

## 2018-09-02 DIAGNOSIS — E1122 Type 2 diabetes mellitus with diabetic chronic kidney disease: Secondary | ICD-10-CM | POA: Diagnosis not present

## 2018-09-02 DIAGNOSIS — J449 Chronic obstructive pulmonary disease, unspecified: Secondary | ICD-10-CM | POA: Diagnosis not present

## 2018-09-02 DIAGNOSIS — J9610 Chronic respiratory failure, unspecified whether with hypoxia or hypercapnia: Secondary | ICD-10-CM | POA: Diagnosis not present

## 2018-09-02 DIAGNOSIS — L97229 Non-pressure chronic ulcer of left calf with unspecified severity: Secondary | ICD-10-CM | POA: Diagnosis not present

## 2018-09-02 DIAGNOSIS — L97819 Non-pressure chronic ulcer of other part of right lower leg with unspecified severity: Secondary | ICD-10-CM | POA: Diagnosis not present

## 2018-09-04 ENCOUNTER — Other Ambulatory Visit: Payer: Self-pay | Admitting: Internal Medicine

## 2018-09-04 DIAGNOSIS — Z4800 Encounter for change or removal of nonsurgical wound dressing: Secondary | ICD-10-CM | POA: Diagnosis not present

## 2018-09-08 DIAGNOSIS — J9690 Respiratory failure, unspecified, unspecified whether with hypoxia or hypercapnia: Secondary | ICD-10-CM | POA: Diagnosis not present

## 2018-09-08 DIAGNOSIS — A4189 Other specified sepsis: Secondary | ICD-10-CM | POA: Diagnosis not present

## 2018-09-08 DIAGNOSIS — R062 Wheezing: Secondary | ICD-10-CM | POA: Diagnosis not present

## 2018-09-10 DIAGNOSIS — N189 Chronic kidney disease, unspecified: Secondary | ICD-10-CM | POA: Diagnosis not present

## 2018-09-10 DIAGNOSIS — Z9981 Dependence on supplemental oxygen: Secondary | ICD-10-CM | POA: Diagnosis not present

## 2018-09-10 DIAGNOSIS — Z87440 Personal history of urinary (tract) infections: Secondary | ICD-10-CM | POA: Diagnosis not present

## 2018-09-10 DIAGNOSIS — I130 Hypertensive heart and chronic kidney disease with heart failure and stage 1 through stage 4 chronic kidney disease, or unspecified chronic kidney disease: Secondary | ICD-10-CM | POA: Diagnosis not present

## 2018-09-10 DIAGNOSIS — J449 Chronic obstructive pulmonary disease, unspecified: Secondary | ICD-10-CM | POA: Diagnosis not present

## 2018-09-10 DIAGNOSIS — Z7902 Long term (current) use of antithrombotics/antiplatelets: Secondary | ICD-10-CM | POA: Diagnosis not present

## 2018-09-10 DIAGNOSIS — D631 Anemia in chronic kidney disease: Secondary | ICD-10-CM | POA: Diagnosis not present

## 2018-09-10 DIAGNOSIS — E1122 Type 2 diabetes mellitus with diabetic chronic kidney disease: Secondary | ICD-10-CM | POA: Diagnosis not present

## 2018-09-10 DIAGNOSIS — E785 Hyperlipidemia, unspecified: Secondary | ICD-10-CM | POA: Diagnosis not present

## 2018-09-10 DIAGNOSIS — I872 Venous insufficiency (chronic) (peripheral): Secondary | ICD-10-CM | POA: Diagnosis not present

## 2018-09-10 DIAGNOSIS — I272 Pulmonary hypertension, unspecified: Secondary | ICD-10-CM | POA: Diagnosis not present

## 2018-09-10 DIAGNOSIS — Z7982 Long term (current) use of aspirin: Secondary | ICD-10-CM | POA: Diagnosis not present

## 2018-09-10 DIAGNOSIS — E1151 Type 2 diabetes mellitus with diabetic peripheral angiopathy without gangrene: Secondary | ICD-10-CM | POA: Diagnosis not present

## 2018-09-10 DIAGNOSIS — C50919 Malignant neoplasm of unspecified site of unspecified female breast: Secondary | ICD-10-CM | POA: Diagnosis not present

## 2018-09-10 DIAGNOSIS — J9610 Chronic respiratory failure, unspecified whether with hypoxia or hypercapnia: Secondary | ICD-10-CM | POA: Diagnosis not present

## 2018-09-10 DIAGNOSIS — Z794 Long term (current) use of insulin: Secondary | ICD-10-CM | POA: Diagnosis not present

## 2018-09-10 DIAGNOSIS — I5030 Unspecified diastolic (congestive) heart failure: Secondary | ICD-10-CM | POA: Diagnosis not present

## 2018-09-26 DIAGNOSIS — I5030 Unspecified diastolic (congestive) heart failure: Secondary | ICD-10-CM | POA: Diagnosis not present

## 2018-09-26 DIAGNOSIS — Z87440 Personal history of urinary (tract) infections: Secondary | ICD-10-CM | POA: Diagnosis not present

## 2018-09-26 DIAGNOSIS — Z7982 Long term (current) use of aspirin: Secondary | ICD-10-CM | POA: Diagnosis not present

## 2018-09-26 DIAGNOSIS — E785 Hyperlipidemia, unspecified: Secondary | ICD-10-CM | POA: Diagnosis not present

## 2018-09-26 DIAGNOSIS — E1122 Type 2 diabetes mellitus with diabetic chronic kidney disease: Secondary | ICD-10-CM | POA: Diagnosis not present

## 2018-09-26 DIAGNOSIS — J9610 Chronic respiratory failure, unspecified whether with hypoxia or hypercapnia: Secondary | ICD-10-CM | POA: Diagnosis not present

## 2018-09-26 DIAGNOSIS — Z794 Long term (current) use of insulin: Secondary | ICD-10-CM | POA: Diagnosis not present

## 2018-09-26 DIAGNOSIS — I130 Hypertensive heart and chronic kidney disease with heart failure and stage 1 through stage 4 chronic kidney disease, or unspecified chronic kidney disease: Secondary | ICD-10-CM | POA: Diagnosis not present

## 2018-09-26 DIAGNOSIS — D631 Anemia in chronic kidney disease: Secondary | ICD-10-CM | POA: Diagnosis not present

## 2018-09-26 DIAGNOSIS — Z9981 Dependence on supplemental oxygen: Secondary | ICD-10-CM | POA: Diagnosis not present

## 2018-09-26 DIAGNOSIS — N189 Chronic kidney disease, unspecified: Secondary | ICD-10-CM | POA: Diagnosis not present

## 2018-09-26 DIAGNOSIS — J449 Chronic obstructive pulmonary disease, unspecified: Secondary | ICD-10-CM | POA: Diagnosis not present

## 2018-09-26 DIAGNOSIS — Z7902 Long term (current) use of antithrombotics/antiplatelets: Secondary | ICD-10-CM | POA: Diagnosis not present

## 2018-09-26 DIAGNOSIS — I272 Pulmonary hypertension, unspecified: Secondary | ICD-10-CM | POA: Diagnosis not present

## 2018-09-26 DIAGNOSIS — I872 Venous insufficiency (chronic) (peripheral): Secondary | ICD-10-CM | POA: Diagnosis not present

## 2018-09-26 DIAGNOSIS — E1151 Type 2 diabetes mellitus with diabetic peripheral angiopathy without gangrene: Secondary | ICD-10-CM | POA: Diagnosis not present

## 2018-09-26 DIAGNOSIS — C50919 Malignant neoplasm of unspecified site of unspecified female breast: Secondary | ICD-10-CM | POA: Diagnosis not present

## 2018-10-01 ENCOUNTER — Other Ambulatory Visit: Payer: Self-pay | Admitting: Internal Medicine

## 2018-10-03 ENCOUNTER — Other Ambulatory Visit: Payer: Self-pay | Admitting: Internal Medicine

## 2018-10-04 DIAGNOSIS — Z9981 Dependence on supplemental oxygen: Secondary | ICD-10-CM | POA: Diagnosis not present

## 2018-10-04 DIAGNOSIS — I5030 Unspecified diastolic (congestive) heart failure: Secondary | ICD-10-CM | POA: Diagnosis not present

## 2018-10-04 DIAGNOSIS — Z7902 Long term (current) use of antithrombotics/antiplatelets: Secondary | ICD-10-CM | POA: Diagnosis not present

## 2018-10-04 DIAGNOSIS — I272 Pulmonary hypertension, unspecified: Secondary | ICD-10-CM | POA: Diagnosis not present

## 2018-10-04 DIAGNOSIS — D631 Anemia in chronic kidney disease: Secondary | ICD-10-CM | POA: Diagnosis not present

## 2018-10-04 DIAGNOSIS — E1122 Type 2 diabetes mellitus with diabetic chronic kidney disease: Secondary | ICD-10-CM | POA: Diagnosis not present

## 2018-10-04 DIAGNOSIS — J9610 Chronic respiratory failure, unspecified whether with hypoxia or hypercapnia: Secondary | ICD-10-CM | POA: Diagnosis not present

## 2018-10-04 DIAGNOSIS — Z7982 Long term (current) use of aspirin: Secondary | ICD-10-CM | POA: Diagnosis not present

## 2018-10-04 DIAGNOSIS — C50919 Malignant neoplasm of unspecified site of unspecified female breast: Secondary | ICD-10-CM | POA: Diagnosis not present

## 2018-10-04 DIAGNOSIS — N189 Chronic kidney disease, unspecified: Secondary | ICD-10-CM | POA: Diagnosis not present

## 2018-10-04 DIAGNOSIS — J449 Chronic obstructive pulmonary disease, unspecified: Secondary | ICD-10-CM | POA: Diagnosis not present

## 2018-10-04 DIAGNOSIS — E1151 Type 2 diabetes mellitus with diabetic peripheral angiopathy without gangrene: Secondary | ICD-10-CM | POA: Diagnosis not present

## 2018-10-04 DIAGNOSIS — Z87440 Personal history of urinary (tract) infections: Secondary | ICD-10-CM | POA: Diagnosis not present

## 2018-10-04 DIAGNOSIS — Z794 Long term (current) use of insulin: Secondary | ICD-10-CM | POA: Diagnosis not present

## 2018-10-04 DIAGNOSIS — I872 Venous insufficiency (chronic) (peripheral): Secondary | ICD-10-CM | POA: Diagnosis not present

## 2018-10-04 DIAGNOSIS — E785 Hyperlipidemia, unspecified: Secondary | ICD-10-CM | POA: Diagnosis not present

## 2018-10-04 DIAGNOSIS — I130 Hypertensive heart and chronic kidney disease with heart failure and stage 1 through stage 4 chronic kidney disease, or unspecified chronic kidney disease: Secondary | ICD-10-CM | POA: Diagnosis not present

## 2018-10-09 DIAGNOSIS — A4189 Other specified sepsis: Secondary | ICD-10-CM | POA: Diagnosis not present

## 2018-10-09 DIAGNOSIS — D631 Anemia in chronic kidney disease: Secondary | ICD-10-CM | POA: Diagnosis not present

## 2018-10-09 DIAGNOSIS — J449 Chronic obstructive pulmonary disease, unspecified: Secondary | ICD-10-CM | POA: Diagnosis not present

## 2018-10-09 DIAGNOSIS — Z9981 Dependence on supplemental oxygen: Secondary | ICD-10-CM | POA: Diagnosis not present

## 2018-10-09 DIAGNOSIS — R062 Wheezing: Secondary | ICD-10-CM | POA: Diagnosis not present

## 2018-10-09 DIAGNOSIS — Z87440 Personal history of urinary (tract) infections: Secondary | ICD-10-CM | POA: Diagnosis not present

## 2018-10-09 DIAGNOSIS — E1151 Type 2 diabetes mellitus with diabetic peripheral angiopathy without gangrene: Secondary | ICD-10-CM | POA: Diagnosis not present

## 2018-10-09 DIAGNOSIS — C50919 Malignant neoplasm of unspecified site of unspecified female breast: Secondary | ICD-10-CM | POA: Diagnosis not present

## 2018-10-09 DIAGNOSIS — J9610 Chronic respiratory failure, unspecified whether with hypoxia or hypercapnia: Secondary | ICD-10-CM | POA: Diagnosis not present

## 2018-10-09 DIAGNOSIS — Z7982 Long term (current) use of aspirin: Secondary | ICD-10-CM | POA: Diagnosis not present

## 2018-10-09 DIAGNOSIS — I5030 Unspecified diastolic (congestive) heart failure: Secondary | ICD-10-CM | POA: Diagnosis not present

## 2018-10-09 DIAGNOSIS — I872 Venous insufficiency (chronic) (peripheral): Secondary | ICD-10-CM | POA: Diagnosis not present

## 2018-10-09 DIAGNOSIS — I130 Hypertensive heart and chronic kidney disease with heart failure and stage 1 through stage 4 chronic kidney disease, or unspecified chronic kidney disease: Secondary | ICD-10-CM | POA: Diagnosis not present

## 2018-10-09 DIAGNOSIS — Z794 Long term (current) use of insulin: Secondary | ICD-10-CM | POA: Diagnosis not present

## 2018-10-09 DIAGNOSIS — I272 Pulmonary hypertension, unspecified: Secondary | ICD-10-CM | POA: Diagnosis not present

## 2018-10-09 DIAGNOSIS — J9690 Respiratory failure, unspecified, unspecified whether with hypoxia or hypercapnia: Secondary | ICD-10-CM | POA: Diagnosis not present

## 2018-10-09 DIAGNOSIS — E1122 Type 2 diabetes mellitus with diabetic chronic kidney disease: Secondary | ICD-10-CM | POA: Diagnosis not present

## 2018-10-09 DIAGNOSIS — Z7902 Long term (current) use of antithrombotics/antiplatelets: Secondary | ICD-10-CM | POA: Diagnosis not present

## 2018-10-09 DIAGNOSIS — N189 Chronic kidney disease, unspecified: Secondary | ICD-10-CM | POA: Diagnosis not present

## 2018-10-09 DIAGNOSIS — E785 Hyperlipidemia, unspecified: Secondary | ICD-10-CM | POA: Diagnosis not present

## 2018-10-17 DIAGNOSIS — I10 Essential (primary) hypertension: Secondary | ICD-10-CM | POA: Diagnosis not present

## 2018-10-17 DIAGNOSIS — I5032 Chronic diastolic (congestive) heart failure: Secondary | ICD-10-CM | POA: Diagnosis not present

## 2018-10-23 DIAGNOSIS — I5030 Unspecified diastolic (congestive) heart failure: Secondary | ICD-10-CM | POA: Diagnosis not present

## 2018-10-23 DIAGNOSIS — J449 Chronic obstructive pulmonary disease, unspecified: Secondary | ICD-10-CM | POA: Diagnosis not present

## 2018-10-23 DIAGNOSIS — I130 Hypertensive heart and chronic kidney disease with heart failure and stage 1 through stage 4 chronic kidney disease, or unspecified chronic kidney disease: Secondary | ICD-10-CM | POA: Diagnosis not present

## 2018-10-23 DIAGNOSIS — J9610 Chronic respiratory failure, unspecified whether with hypoxia or hypercapnia: Secondary | ICD-10-CM | POA: Diagnosis not present

## 2018-10-23 DIAGNOSIS — N189 Chronic kidney disease, unspecified: Secondary | ICD-10-CM | POA: Diagnosis not present

## 2018-10-23 DIAGNOSIS — Z7982 Long term (current) use of aspirin: Secondary | ICD-10-CM | POA: Diagnosis not present

## 2018-10-23 DIAGNOSIS — E1151 Type 2 diabetes mellitus with diabetic peripheral angiopathy without gangrene: Secondary | ICD-10-CM | POA: Diagnosis not present

## 2018-10-23 DIAGNOSIS — Z9981 Dependence on supplemental oxygen: Secondary | ICD-10-CM | POA: Diagnosis not present

## 2018-10-23 DIAGNOSIS — C50919 Malignant neoplasm of unspecified site of unspecified female breast: Secondary | ICD-10-CM | POA: Diagnosis not present

## 2018-10-23 DIAGNOSIS — I872 Venous insufficiency (chronic) (peripheral): Secondary | ICD-10-CM | POA: Diagnosis not present

## 2018-10-23 DIAGNOSIS — E1122 Type 2 diabetes mellitus with diabetic chronic kidney disease: Secondary | ICD-10-CM | POA: Diagnosis not present

## 2018-10-23 DIAGNOSIS — D631 Anemia in chronic kidney disease: Secondary | ICD-10-CM | POA: Diagnosis not present

## 2018-10-23 DIAGNOSIS — Z87440 Personal history of urinary (tract) infections: Secondary | ICD-10-CM | POA: Diagnosis not present

## 2018-10-23 DIAGNOSIS — Z794 Long term (current) use of insulin: Secondary | ICD-10-CM | POA: Diagnosis not present

## 2018-10-23 DIAGNOSIS — I272 Pulmonary hypertension, unspecified: Secondary | ICD-10-CM | POA: Diagnosis not present

## 2018-10-23 DIAGNOSIS — Z7902 Long term (current) use of antithrombotics/antiplatelets: Secondary | ICD-10-CM | POA: Diagnosis not present

## 2018-10-23 DIAGNOSIS — E785 Hyperlipidemia, unspecified: Secondary | ICD-10-CM | POA: Diagnosis not present

## 2018-10-24 DIAGNOSIS — R188 Other ascites: Secondary | ICD-10-CM | POA: Diagnosis not present

## 2018-10-24 DIAGNOSIS — Z9981 Dependence on supplemental oxygen: Secondary | ICD-10-CM | POA: Diagnosis not present

## 2018-10-24 DIAGNOSIS — E7800 Pure hypercholesterolemia, unspecified: Secondary | ICD-10-CM | POA: Diagnosis not present

## 2018-10-24 DIAGNOSIS — E039 Hypothyroidism, unspecified: Secondary | ICD-10-CM | POA: Diagnosis not present

## 2018-10-24 DIAGNOSIS — N19 Unspecified kidney failure: Secondary | ICD-10-CM | POA: Diagnosis not present

## 2018-10-24 DIAGNOSIS — Z713 Dietary counseling and surveillance: Secondary | ICD-10-CM | POA: Diagnosis not present

## 2018-10-24 DIAGNOSIS — N179 Acute kidney failure, unspecified: Secondary | ICD-10-CM | POA: Diagnosis not present

## 2018-10-24 DIAGNOSIS — J449 Chronic obstructive pulmonary disease, unspecified: Secondary | ICD-10-CM | POA: Diagnosis not present

## 2018-10-24 DIAGNOSIS — M549 Dorsalgia, unspecified: Secondary | ICD-10-CM | POA: Diagnosis not present

## 2018-10-24 DIAGNOSIS — R072 Precordial pain: Secondary | ICD-10-CM | POA: Diagnosis not present

## 2018-10-24 DIAGNOSIS — N185 Chronic kidney disease, stage 5: Secondary | ICD-10-CM | POA: Diagnosis not present

## 2018-10-24 DIAGNOSIS — Z79899 Other long term (current) drug therapy: Secondary | ICD-10-CM | POA: Diagnosis not present

## 2018-10-24 DIAGNOSIS — I371 Nonrheumatic pulmonary valve insufficiency: Secondary | ICD-10-CM | POA: Diagnosis not present

## 2018-10-24 DIAGNOSIS — E1122 Type 2 diabetes mellitus with diabetic chronic kidney disease: Secondary | ICD-10-CM | POA: Diagnosis not present

## 2018-10-24 DIAGNOSIS — I5032 Chronic diastolic (congestive) heart failure: Secondary | ICD-10-CM | POA: Diagnosis not present

## 2018-10-24 DIAGNOSIS — Z7982 Long term (current) use of aspirin: Secondary | ICD-10-CM | POA: Diagnosis not present

## 2018-10-24 DIAGNOSIS — J90 Pleural effusion, not elsewhere classified: Secondary | ICD-10-CM | POA: Diagnosis not present

## 2018-10-24 DIAGNOSIS — R918 Other nonspecific abnormal finding of lung field: Secondary | ICD-10-CM | POA: Diagnosis not present

## 2018-10-24 DIAGNOSIS — I132 Hypertensive heart and chronic kidney disease with heart failure and with stage 5 chronic kidney disease, or end stage renal disease: Secondary | ICD-10-CM | POA: Diagnosis not present

## 2018-10-24 DIAGNOSIS — E876 Hypokalemia: Secondary | ICD-10-CM | POA: Diagnosis not present

## 2018-10-24 DIAGNOSIS — Z853 Personal history of malignant neoplasm of breast: Secondary | ICD-10-CM | POA: Diagnosis not present

## 2018-10-24 DIAGNOSIS — E875 Hyperkalemia: Secondary | ICD-10-CM | POA: Diagnosis not present

## 2018-10-24 DIAGNOSIS — I5033 Acute on chronic diastolic (congestive) heart failure: Secondary | ICD-10-CM | POA: Diagnosis not present

## 2018-10-24 DIAGNOSIS — I959 Hypotension, unspecified: Secondary | ICD-10-CM | POA: Diagnosis not present

## 2018-10-24 DIAGNOSIS — R0602 Shortness of breath: Secondary | ICD-10-CM | POA: Diagnosis not present

## 2018-10-24 DIAGNOSIS — D649 Anemia, unspecified: Secondary | ICD-10-CM | POA: Diagnosis not present

## 2018-10-24 DIAGNOSIS — R079 Chest pain, unspecified: Secondary | ICD-10-CM | POA: Diagnosis not present

## 2018-10-24 DIAGNOSIS — Z794 Long term (current) use of insulin: Secondary | ICD-10-CM | POA: Diagnosis not present

## 2018-10-24 DIAGNOSIS — J9610 Chronic respiratory failure, unspecified whether with hypoxia or hypercapnia: Secondary | ICD-10-CM | POA: Diagnosis not present

## 2018-10-24 DIAGNOSIS — M1990 Unspecified osteoarthritis, unspecified site: Secondary | ICD-10-CM | POA: Diagnosis not present

## 2018-10-24 DIAGNOSIS — I1310 Hypertensive heart and chronic kidney disease without heart failure, with stage 1 through stage 4 chronic kidney disease, or unspecified chronic kidney disease: Secondary | ICD-10-CM | POA: Diagnosis not present

## 2018-10-24 DIAGNOSIS — I361 Nonrheumatic tricuspid (valve) insufficiency: Secondary | ICD-10-CM | POA: Diagnosis not present

## 2018-10-24 DIAGNOSIS — R110 Nausea: Secondary | ICD-10-CM | POA: Diagnosis not present

## 2018-10-25 DIAGNOSIS — I361 Nonrheumatic tricuspid (valve) insufficiency: Secondary | ICD-10-CM

## 2018-10-25 DIAGNOSIS — I5033 Acute on chronic diastolic (congestive) heart failure: Secondary | ICD-10-CM

## 2018-10-25 DIAGNOSIS — I371 Nonrheumatic pulmonary valve insufficiency: Secondary | ICD-10-CM

## 2018-10-26 DIAGNOSIS — N185 Chronic kidney disease, stage 5: Secondary | ICD-10-CM

## 2018-10-26 DIAGNOSIS — I5032 Chronic diastolic (congestive) heart failure: Secondary | ICD-10-CM

## 2018-10-26 DIAGNOSIS — D649 Anemia, unspecified: Secondary | ICD-10-CM

## 2018-10-26 DIAGNOSIS — I132 Hypertensive heart and chronic kidney disease with heart failure and with stage 5 chronic kidney disease, or end stage renal disease: Secondary | ICD-10-CM

## 2018-10-27 ENCOUNTER — Encounter (HOSPITAL_COMMUNITY): Payer: Self-pay | Admitting: Internal Medicine

## 2018-10-27 ENCOUNTER — Inpatient Hospital Stay (HOSPITAL_COMMUNITY): Payer: Medicare Other

## 2018-10-27 ENCOUNTER — Inpatient Hospital Stay (HOSPITAL_COMMUNITY)
Admission: AD | Admit: 2018-10-27 | Discharge: 2018-11-08 | DRG: 682 | Disposition: A | Discharge status: Skilled Nursing Facility | Payer: Medicare Other | Source: Other Acute Inpatient Hospital | Attending: Internal Medicine | Admitting: Internal Medicine

## 2018-10-27 DIAGNOSIS — I50810 Right heart failure, unspecified: Secondary | ICD-10-CM | POA: Diagnosis not present

## 2018-10-27 DIAGNOSIS — I5033 Acute on chronic diastolic (congestive) heart failure: Secondary | ICD-10-CM | POA: Diagnosis not present

## 2018-10-27 DIAGNOSIS — E875 Hyperkalemia: Secondary | ICD-10-CM | POA: Diagnosis not present

## 2018-10-27 DIAGNOSIS — D631 Anemia in chronic kidney disease: Secondary | ICD-10-CM | POA: Diagnosis present

## 2018-10-27 DIAGNOSIS — J9690 Respiratory failure, unspecified, unspecified whether with hypoxia or hypercapnia: Secondary | ICD-10-CM

## 2018-10-27 DIAGNOSIS — I371 Nonrheumatic pulmonary valve insufficiency: Secondary | ICD-10-CM | POA: Diagnosis not present

## 2018-10-27 DIAGNOSIS — E785 Hyperlipidemia, unspecified: Secondary | ICD-10-CM | POA: Diagnosis not present

## 2018-10-27 DIAGNOSIS — I2781 Cor pulmonale (chronic): Secondary | ICD-10-CM | POA: Diagnosis not present

## 2018-10-27 DIAGNOSIS — Z885 Allergy status to narcotic agent status: Secondary | ICD-10-CM

## 2018-10-27 DIAGNOSIS — J9811 Atelectasis: Secondary | ICD-10-CM | POA: Diagnosis not present

## 2018-10-27 DIAGNOSIS — I959 Hypotension, unspecified: Secondary | ICD-10-CM | POA: Diagnosis not present

## 2018-10-27 DIAGNOSIS — R05 Cough: Secondary | ICD-10-CM | POA: Diagnosis not present

## 2018-10-27 DIAGNOSIS — Z853 Personal history of malignant neoplasm of breast: Secondary | ICD-10-CM | POA: Diagnosis not present

## 2018-10-27 DIAGNOSIS — E876 Hypokalemia: Secondary | ICD-10-CM | POA: Diagnosis not present

## 2018-10-27 DIAGNOSIS — I509 Heart failure, unspecified: Secondary | ICD-10-CM

## 2018-10-27 DIAGNOSIS — R222 Localized swelling, mass and lump, trunk: Secondary | ICD-10-CM | POA: Diagnosis present

## 2018-10-27 DIAGNOSIS — J9621 Acute and chronic respiratory failure with hypoxia: Secondary | ICD-10-CM | POA: Diagnosis present

## 2018-10-27 DIAGNOSIS — J90 Pleural effusion, not elsewhere classified: Secondary | ICD-10-CM | POA: Diagnosis not present

## 2018-10-27 DIAGNOSIS — E8809 Other disorders of plasma-protein metabolism, not elsewhere classified: Secondary | ICD-10-CM | POA: Diagnosis present

## 2018-10-27 DIAGNOSIS — Z825 Family history of asthma and other chronic lower respiratory diseases: Secondary | ICD-10-CM

## 2018-10-27 DIAGNOSIS — N189 Chronic kidney disease, unspecified: Secondary | ICD-10-CM

## 2018-10-27 DIAGNOSIS — N179 Acute kidney failure, unspecified: Principal | ICD-10-CM | POA: Diagnosis present

## 2018-10-27 DIAGNOSIS — R0603 Acute respiratory distress: Secondary | ICD-10-CM

## 2018-10-27 DIAGNOSIS — G92 Toxic encephalopathy: Secondary | ICD-10-CM | POA: Diagnosis not present

## 2018-10-27 DIAGNOSIS — R062 Wheezing: Secondary | ICD-10-CM | POA: Diagnosis not present

## 2018-10-27 DIAGNOSIS — N170 Acute kidney failure with tubular necrosis: Secondary | ICD-10-CM | POA: Diagnosis not present

## 2018-10-27 DIAGNOSIS — E1122 Type 2 diabetes mellitus with diabetic chronic kidney disease: Secondary | ICD-10-CM | POA: Diagnosis present

## 2018-10-27 DIAGNOSIS — Z8249 Family history of ischemic heart disease and other diseases of the circulatory system: Secondary | ICD-10-CM

## 2018-10-27 DIAGNOSIS — R0689 Other abnormalities of breathing: Secondary | ICD-10-CM | POA: Diagnosis not present

## 2018-10-27 DIAGNOSIS — R0602 Shortness of breath: Secondary | ICD-10-CM | POA: Diagnosis not present

## 2018-10-27 DIAGNOSIS — I361 Nonrheumatic tricuspid (valve) insufficiency: Secondary | ICD-10-CM | POA: Diagnosis not present

## 2018-10-27 DIAGNOSIS — R188 Other ascites: Secondary | ICD-10-CM | POA: Diagnosis not present

## 2018-10-27 DIAGNOSIS — R570 Cardiogenic shock: Secondary | ICD-10-CM | POA: Diagnosis not present

## 2018-10-27 DIAGNOSIS — R42 Dizziness and giddiness: Secondary | ICD-10-CM | POA: Diagnosis not present

## 2018-10-27 DIAGNOSIS — Z803 Family history of malignant neoplasm of breast: Secondary | ICD-10-CM

## 2018-10-27 DIAGNOSIS — R918 Other nonspecific abnormal finding of lung field: Secondary | ICD-10-CM | POA: Diagnosis not present

## 2018-10-27 DIAGNOSIS — K0530 Chronic periodontitis, unspecified: Secondary | ICD-10-CM | POA: Diagnosis not present

## 2018-10-27 DIAGNOSIS — A4189 Other specified sepsis: Secondary | ICD-10-CM | POA: Diagnosis not present

## 2018-10-27 DIAGNOSIS — M2550 Pain in unspecified joint: Secondary | ICD-10-CM | POA: Diagnosis not present

## 2018-10-27 DIAGNOSIS — Z6841 Body Mass Index (BMI) 40.0 and over, adult: Secondary | ICD-10-CM | POA: Diagnosis not present

## 2018-10-27 DIAGNOSIS — Z20828 Contact with and (suspected) exposure to other viral communicable diseases: Secondary | ICD-10-CM | POA: Diagnosis present

## 2018-10-27 DIAGNOSIS — Z9981 Dependence on supplemental oxygen: Secondary | ICD-10-CM

## 2018-10-27 DIAGNOSIS — I5032 Chronic diastolic (congestive) heart failure: Secondary | ICD-10-CM

## 2018-10-27 DIAGNOSIS — Z515 Encounter for palliative care: Secondary | ICD-10-CM

## 2018-10-27 DIAGNOSIS — I878 Other specified disorders of veins: Secondary | ICD-10-CM | POA: Diagnosis present

## 2018-10-27 DIAGNOSIS — T502X5A Adverse effect of carbonic-anhydrase inhibitors, benzothiadiazides and other diuretics, initial encounter: Secondary | ICD-10-CM | POA: Diagnosis present

## 2018-10-27 DIAGNOSIS — Z800 Family history of malignant neoplasm of digestive organs: Secondary | ICD-10-CM

## 2018-10-27 DIAGNOSIS — G4733 Obstructive sleep apnea (adult) (pediatric): Secondary | ICD-10-CM | POA: Diagnosis present

## 2018-10-27 DIAGNOSIS — Z9012 Acquired absence of left breast and nipple: Secondary | ICD-10-CM

## 2018-10-27 DIAGNOSIS — J449 Chronic obstructive pulmonary disease, unspecified: Secondary | ICD-10-CM

## 2018-10-27 DIAGNOSIS — K06020 Generalized gingival recession, unspecified: Secondary | ICD-10-CM | POA: Diagnosis present

## 2018-10-27 DIAGNOSIS — I130 Hypertensive heart and chronic kidney disease with heart failure and stage 1 through stage 4 chronic kidney disease, or unspecified chronic kidney disease: Secondary | ICD-10-CM | POA: Diagnosis present

## 2018-10-27 DIAGNOSIS — N183 Chronic kidney disease, stage 3 (moderate): Secondary | ICD-10-CM | POA: Diagnosis not present

## 2018-10-27 DIAGNOSIS — J9622 Acute and chronic respiratory failure with hypercapnia: Secondary | ICD-10-CM | POA: Diagnosis not present

## 2018-10-27 DIAGNOSIS — I480 Paroxysmal atrial fibrillation: Secondary | ICD-10-CM | POA: Diagnosis not present

## 2018-10-27 DIAGNOSIS — I5082 Biventricular heart failure: Secondary | ICD-10-CM | POA: Diagnosis present

## 2018-10-27 DIAGNOSIS — I071 Rheumatic tricuspid insufficiency: Secondary | ICD-10-CM | POA: Diagnosis present

## 2018-10-27 DIAGNOSIS — K029 Dental caries, unspecified: Secondary | ICD-10-CM | POA: Diagnosis present

## 2018-10-27 DIAGNOSIS — C50919 Malignant neoplasm of unspecified site of unspecified female breast: Secondary | ICD-10-CM | POA: Diagnosis not present

## 2018-10-27 DIAGNOSIS — Z7401 Bed confinement status: Secondary | ICD-10-CM | POA: Diagnosis not present

## 2018-10-27 DIAGNOSIS — K0889 Other specified disorders of teeth and supporting structures: Secondary | ICD-10-CM

## 2018-10-27 DIAGNOSIS — E8770 Fluid overload, unspecified: Secondary | ICD-10-CM | POA: Diagnosis present

## 2018-10-27 DIAGNOSIS — J986 Disorders of diaphragm: Secondary | ICD-10-CM | POA: Diagnosis not present

## 2018-10-27 DIAGNOSIS — R000 Tachycardia, unspecified: Secondary | ICD-10-CM | POA: Diagnosis not present

## 2018-10-27 DIAGNOSIS — I272 Pulmonary hypertension, unspecified: Secondary | ICD-10-CM | POA: Diagnosis not present

## 2018-10-27 DIAGNOSIS — I2721 Secondary pulmonary arterial hypertension: Secondary | ICD-10-CM | POA: Diagnosis present

## 2018-10-27 DIAGNOSIS — R5381 Other malaise: Secondary | ICD-10-CM | POA: Diagnosis present

## 2018-10-27 DIAGNOSIS — E119 Type 2 diabetes mellitus without complications: Secondary | ICD-10-CM | POA: Diagnosis not present

## 2018-10-27 DIAGNOSIS — D509 Iron deficiency anemia, unspecified: Secondary | ICD-10-CM | POA: Diagnosis present

## 2018-10-27 DIAGNOSIS — Z7189 Other specified counseling: Secondary | ICD-10-CM

## 2018-10-27 HISTORY — DX: Essential (primary) hypertension: I10

## 2018-10-27 HISTORY — DX: Morbid (severe) obesity due to excess calories: E66.01

## 2018-10-27 HISTORY — DX: Chronic obstructive pulmonary disease, unspecified: J44.9

## 2018-10-27 HISTORY — DX: Hyperlipidemia, unspecified: E78.5

## 2018-10-27 HISTORY — DX: Malignant neoplasm of unspecified site of unspecified female breast: C50.919

## 2018-10-27 HISTORY — DX: Type 2 diabetes mellitus with other specified complication: E11.69

## 2018-10-27 LAB — PROCALCITONIN: Procalcitonin: 0.27 ng/mL

## 2018-10-27 LAB — COMPREHENSIVE METABOLIC PANEL
ALT: 17 U/L (ref 0–44)
AST: 40 U/L (ref 15–41)
Albumin: 2.8 g/dL — ABNORMAL LOW (ref 3.5–5.0)
Alkaline Phosphatase: 186 U/L — ABNORMAL HIGH (ref 38–126)
Anion gap: 9 (ref 5–15)
BUN: 95 mg/dL — ABNORMAL HIGH (ref 8–23)
CO2: 28 mmol/L (ref 22–32)
Calcium: 8 mg/dL — ABNORMAL LOW (ref 8.9–10.3)
Chloride: 99 mmol/L (ref 98–111)
Creatinine, Ser: 3.73 mg/dL — ABNORMAL HIGH (ref 0.44–1.00)
GFR calc Af Amer: 14 mL/min — ABNORMAL LOW (ref >60)
GFR calc non Af Amer: 12 mL/min — ABNORMAL LOW (ref >60)
Glucose, Bld: 120 mg/dL — ABNORMAL HIGH (ref 70–99)
Potassium: 5.2 mmol/L — ABNORMAL HIGH (ref 3.5–5.1)
Sodium: 136 mmol/L (ref 135–145)
Total Bilirubin: 0.8 mg/dL (ref 0.3–1.2)
Total Protein: 7.2 g/dL (ref 6.5–8.1)

## 2018-10-27 LAB — BLOOD GAS, ARTERIAL
Acid-Base Excess: 2.8 mmol/L — ABNORMAL HIGH (ref 0.0–2.0)
Bicarbonate: 27.4 mmol/L (ref 20.0–28.0)
Delivery systems: POSITIVE
Drawn by: 44135
Expiratory PAP: 5
FIO2: 0.3
Inspiratory PAP: 10
O2 Saturation: 96.1 %
Patient temperature: 98.6
pCO2 arterial: 47.5 mmHg (ref 32.0–48.0)
pH, Arterial: 7.38 (ref 7.350–7.450)
pO2, Arterial: 79.2 mmHg — ABNORMAL LOW (ref 83.0–108.0)

## 2018-10-27 LAB — CBC WITH DIFFERENTIAL/PLATELET
Abs Immature Granulocytes: 0.04 10*3/uL (ref 0.00–0.07)
Basophils Absolute: 0 10*3/uL (ref 0.0–0.1)
Basophils Relative: 1 %
Eosinophils Absolute: 0 10*3/uL (ref 0.0–0.5)
Eosinophils Relative: 1 %
HCT: 30.1 % — ABNORMAL LOW (ref 36.0–46.0)
Hemoglobin: 8.7 g/dL — ABNORMAL LOW (ref 12.0–15.0)
Immature Granulocytes: 1 %
Lymphocytes Relative: 4 %
Lymphs Abs: 0.3 10*3/uL — ABNORMAL LOW (ref 0.7–4.0)
MCH: 26.3 pg (ref 26.0–34.0)
MCHC: 28.9 g/dL — ABNORMAL LOW (ref 30.0–36.0)
MCV: 90.9 fL (ref 80.0–100.0)
Monocytes Absolute: 0.5 10*3/uL (ref 0.1–1.0)
Monocytes Relative: 7 %
Neutro Abs: 7.2 10*3/uL (ref 1.7–7.7)
Neutrophils Relative %: 86 %
Platelets: 196 10*3/uL (ref 150–400)
RBC: 3.31 MIL/uL — ABNORMAL LOW (ref 3.87–5.11)
RDW: 15.9 % — ABNORMAL HIGH (ref 11.5–15.5)
WBC: 8.2 10*3/uL (ref 4.0–10.5)
nRBC: 0 % (ref 0.0–0.2)

## 2018-10-27 LAB — RENAL FUNCTION PANEL
Albumin: 3.1 g/dL — ABNORMAL LOW (ref 3.5–5.0)
Anion gap: 11 (ref 5–15)
BUN: 95 mg/dL — ABNORMAL HIGH (ref 8–23)
CO2: 26 mmol/L (ref 22–32)
Calcium: 8.2 mg/dL — ABNORMAL LOW (ref 8.9–10.3)
Chloride: 98 mmol/L (ref 98–111)
Creatinine, Ser: 3.65 mg/dL — ABNORMAL HIGH (ref 0.44–1.00)
GFR calc Af Amer: 14 mL/min — ABNORMAL LOW (ref >60)
GFR calc non Af Amer: 12 mL/min — ABNORMAL LOW (ref >60)
Glucose, Bld: 182 mg/dL — ABNORMAL HIGH (ref 70–99)
Phosphorus: 6 mg/dL — ABNORMAL HIGH (ref 2.5–4.6)
Potassium: 5 mmol/L (ref 3.5–5.1)
Sodium: 135 mmol/L (ref 135–145)

## 2018-10-27 LAB — GLUCOSE, CAPILLARY
Glucose-Capillary: 105 mg/dL — ABNORMAL HIGH (ref 70–99)
Glucose-Capillary: 130 mg/dL — ABNORMAL HIGH (ref 70–99)
Glucose-Capillary: 114 mg/dL — ABNORMAL HIGH (ref 70–99)
Glucose-Capillary: 176 mg/dL — ABNORMAL HIGH (ref 70–99)

## 2018-10-27 LAB — SARS CORONAVIRUS 2 BY RT PCR (HOSPITAL ORDER, PERFORMED IN ~~LOC~~ HOSPITAL LAB): SARS Coronavirus 2: NEGATIVE

## 2018-10-27 LAB — BRAIN NATRIURETIC PEPTIDE: B Natriuretic Peptide: 778 pg/mL — ABNORMAL HIGH (ref 0.0–100.0)

## 2018-10-27 MED ORDER — HEPARIN SODIUM (PORCINE) 5000 UNIT/ML IJ SOLN
5000.0000 [IU] | Freq: Three times a day (TID) | INTRAMUSCULAR | Status: DC
  Administered 2018-10-27 – 2018-11-08 (×37): 5000 [IU] via SUBCUTANEOUS
  Filled 2018-10-27 (×37): qty 1

## 2018-10-27 MED ORDER — NOREPINEPHRINE 4 MG/250ML-% IV SOLN
0.0000 ug/min | INTRAVENOUS | Status: DC
  Administered 2018-10-28: 7 ug/min via INTRAVENOUS
  Administered 2018-10-28: 2 ug/min via INTRAVENOUS
  Administered 2018-10-28: 11 ug/min via INTRAVENOUS
  Administered 2018-10-29: 10 ug/min via INTRAVENOUS
  Administered 2018-10-29: 4 ug/min via INTRAVENOUS
  Administered 2018-10-30: 7 ug/min via INTRAVENOUS
  Filled 2018-10-27 (×6): qty 250

## 2018-10-27 MED ORDER — DEXMEDETOMIDINE HCL IN NACL 400 MCG/100ML IV SOLN
0.4000 ug/kg/h | INTRAVENOUS | Status: DC
  Administered 2018-10-28: 0.4 ug/kg/h via INTRAVENOUS
  Filled 2018-10-27: qty 100

## 2018-10-27 MED ORDER — ACETAMINOPHEN 325 MG PO TABS
650.0000 mg | ORAL_TABLET | Freq: Four times a day (QID) | ORAL | Status: DC | PRN
  Administered 2018-10-27 – 2018-11-07 (×9): 650 mg via ORAL
  Filled 2018-10-27 (×9): qty 2

## 2018-10-27 MED ORDER — LORAZEPAM 2 MG/ML IJ SOLN
INTRAMUSCULAR | Status: AC
  Administered 2018-10-27: 0.5 mg via INTRAVENOUS
  Filled 2018-10-27: qty 1

## 2018-10-27 MED ORDER — ACETAZOLAMIDE SODIUM 500 MG IJ SOLR: 250.0000 mg | Freq: Two times a day (BID) | INTRAMUSCULAR | Status: DC

## 2018-10-27 MED ORDER — ACETAZOLAMIDE SODIUM 500 MG IJ SOLR
250.0000 mg | Freq: Once | INTRAMUSCULAR | Status: AC
  Administered 2018-10-27: 250 mg via INTRAVENOUS
  Filled 2018-10-27: qty 250

## 2018-10-27 MED ORDER — ALBUMIN HUMAN 25 % IV SOLN
25.0000 g | Freq: Four times a day (QID) | INTRAVENOUS | Status: DC
  Administered 2018-10-27 – 2018-10-28 (×3): 25 g via INTRAVENOUS
  Filled 2018-10-27 (×5): qty 50

## 2018-10-27 MED ORDER — MIDODRINE HCL 5 MG PO TABS
10.0000 mg | ORAL_TABLET | Freq: Three times a day (TID) | ORAL | Status: DC
  Administered 2018-10-27 – 2018-11-07 (×36): 10 mg via ORAL
  Filled 2018-10-27 (×36): qty 2

## 2018-10-27 MED ORDER — INSULIN ASPART 100 UNIT/ML ~~LOC~~ SOLN: 0.0000 [IU] | Freq: Every day | SUBCUTANEOUS | Status: DC

## 2018-10-27 MED ORDER — LORAZEPAM 2 MG/ML IJ SOLN
0.5000 mg | Freq: Once | INTRAMUSCULAR | Status: AC
  Administered 2018-10-27: 0.5 mg via INTRAVENOUS

## 2018-10-27 MED ORDER — DEXMEDETOMIDINE HCL IN NACL 200 MCG/50ML IV SOLN: 0.4000 ug/kg/h | INTRAVENOUS | Status: DC

## 2018-10-27 MED ORDER — IPRATROPIUM-ALBUTEROL 0.5-2.5 (3) MG/3ML IN SOLN
3.0000 mL | Freq: Four times a day (QID) | RESPIRATORY_TRACT | Status: DC
  Administered 2018-10-27 (×3): 3 mL via RESPIRATORY_TRACT
  Filled 2018-10-27 (×3): qty 3

## 2018-10-27 MED ORDER — ACETAMINOPHEN 650 MG RE SUPP: 650.0000 mg | Freq: Four times a day (QID) | RECTAL | Status: DC | PRN

## 2018-10-27 MED ORDER — FUROSEMIDE 10 MG/ML IJ SOLN
10.0000 mg/h | INTRAVENOUS | Status: DC
  Administered 2018-10-27: 19:00:00 8 mg/h via INTRAVENOUS
  Administered 2018-10-28 – 2018-11-01 (×6): 10 mg/h via INTRAVENOUS
  Filled 2018-10-27: qty 20
  Filled 2018-10-27 (×6): qty 25
  Filled 2018-10-27: qty 20

## 2018-10-27 MED ORDER — INSULIN ASPART 100 UNIT/ML ~~LOC~~ SOLN
0.0000 [IU] | Freq: Three times a day (TID) | SUBCUTANEOUS | Status: DC
  Administered 2018-10-27 – 2018-10-28 (×2): 1 [IU] via SUBCUTANEOUS
  Administered 2018-10-28: 07:00:00 2 [IU] via SUBCUTANEOUS
  Administered 2018-10-28 – 2018-10-29 (×3): 1 [IU] via SUBCUTANEOUS
  Administered 2018-10-29 – 2018-10-30 (×2): 2 [IU] via SUBCUTANEOUS
  Administered 2018-10-30: 1 [IU] via SUBCUTANEOUS
  Administered 2018-10-30: 2 [IU] via SUBCUTANEOUS
  Administered 2018-10-31: 1 [IU] via SUBCUTANEOUS
  Administered 2018-10-31 – 2018-11-01 (×5): 2 [IU] via SUBCUTANEOUS
  Administered 2018-11-02 – 2018-11-03 (×4): 1 [IU] via SUBCUTANEOUS
  Administered 2018-11-03 (×2): 2 [IU] via SUBCUTANEOUS
  Administered 2018-11-04: 1 [IU] via SUBCUTANEOUS
  Administered 2018-11-04: 2 [IU] via SUBCUTANEOUS
  Administered 2018-11-04: 3 [IU] via SUBCUTANEOUS
  Administered 2018-11-05 – 2018-11-06 (×4): 2 [IU] via SUBCUTANEOUS
  Administered 2018-11-06: 1 [IU] via SUBCUTANEOUS
  Administered 2018-11-06: 2 [IU] via SUBCUTANEOUS
  Administered 2018-11-07: 1 [IU] via SUBCUTANEOUS
  Administered 2018-11-07 (×2): 2 [IU] via SUBCUTANEOUS
  Administered 2018-11-08: 5 [IU] via SUBCUTANEOUS
  Administered 2018-11-08: 2 [IU] via SUBCUTANEOUS

## 2018-10-27 NOTE — Progress Notes (Signed)
RT placed patient on BIPAP per MD order. Patient is tolerating BIPAP settings well at this time. RT will continue to monitor.

## 2018-10-27 NOTE — Significant Event (Signed)
Rapid Response Event Note  Overview: Pt SOB with SpO2-90% on 3L [place]. Blount, NP to bedside and ordered bipap.  Bipap placed at 2028. RRT called d/t worsening SOB on .30 bipap. Time Called: 2048 Arrival Time: 2054 Event Type: Respiratory  Initial Focused Assessment: Pt laying in bed with increased WOB, accessory muscle use, saying "I'm going to die."  HR-90, BP-112/60, RR-40, SpO2-98% on .30 bipap. Pupils 3 and brisk. Skin warm and dry.  Lungs diminished t/o. Pt with compression wraps to BLE. Pt was started on lasix gtt _0 . 225cc UOP since 11am this morning.  Interventions: Continue bipap PCXR-1. Right lung base atelectasis versus infiltrate. 2. Left mid lung field atelectasis/scarring versus infiltrate. 3. Possible small right pleural effusion. ABG-7.38/47.5/79.2/27.4 0.56m ativan IV x 1 @ 2132  2210-Blount spoke with Dr. WGlenetta Borg Lasix gtt increased to 10cc/hr @ 2212 per Dr. WJustin MendCBC [facility])  2215-Blount spoke with PCCM, MD to come to bedside to evaluate.  2236-Dr. RClaudie Leach(PCCM) RFP, BMP, CBC, Procalcitonin (drawn and sent)  2237-Pt pulling off bipap mask(SpO2-78% on RA)  2310-PCCM at bedside, orders to move to ICU  Plan of Care: Transfer to ICU. Await lab results to [facility].  Event Summary: Name of Physician Notified: Blount, NP at (PTA) x 2 2210-[place] 2215-PCCM    at          [place]ang

## 2018-10-27 NOTE — Consult Note (Signed)
[facility]    NEPHROLOGY CONSULTATION NOTE  PATIENT ID:  Tammy Mathews, DOB:  11/30/1950  HPI: The patient is a 68 y.o. year old female patient with a past medical history significant for type 2 diabetes for more than 10 years, hyperlipidemia, hypertension, COPD, and breast cancer who presented as a transfer from [place] due to concerns for worsening renal function.  She was noted to have moderate to severe pulmonary hypertension with diastolic dysfunction.  Per the hospitalist at [facility], they were having difficulty diuresing her with worsening renal function with increasing doses of diuretics without improvement in urine output.  The patient complains of shortness of breath that has been ongoing for several weeks.  She reports she has had a significant amount of fluid that she has been having difficulty getting rid of.  The chart documents a 50 pound weight gain.  Renal consultation has been called for acute kidney injury, and chronic kidney disease, and difficulty with diuresis.   Past Medical History:  Diagnosis Date  . Breast cancer ([place])   . COPD (chronic obstructive pulmonary disease) ([place])   . Diabetes mellitus type 2 in obese ([place])   . HLD (hyperlipidemia)   . HTN (hypertension)   . Morbid obesity ([place])     Past Surgical History:  Procedure Laterality Date  . BREAST SURGERY    . HERNIA REPAIR      Family History  Problem Relation Age of Onset  . Heart disease Mother   . Colon cancer Mother   . Breast cancer Mother   . Emphysema Father        smoked  . Emphysema Brother        smoked    Social History   Tobacco Use  . Smoking status: Never Smoker  . Smokeless tobacco: Never Used  Substance Use Topics  . Alcohol use: Never    Frequency: Never  . Drug use: Never    REVIEW OF SYSTEMS: General: Positive fatigue, no weakness Head:  no headaches Eyes:  no blurred vision ENT:  no sore throat Neck:  no masses CV:  no chest pain, positive  orthopnea, positive lower extremity edema Lungs: Positive shortness of breath, positive orthopnea  GI:  no nausea or vomiting, no diarrhea GU:  no dysuria or hematuria Skin:  no rashes or lesions Neuro:  no focal numbness or weakness Psych:  no depression or anxiety    PHYSICAL EXAM:  Vitals:   10/27/18 0846 10/27/18 1444  BP:    Pulse: 73 64  Resp: 20 17  Temp:    SpO2: 97% 100%   No intake/output data recorded.   General:  AAOx3 NAD HEENT: MMM [place] AT anicteric sclera Neck: Positive JVD, no adenopathy CV:  Heart RRR  Lungs: Lung sounds with bibasilar Rales Abd:  abd SNT/ND with normal BS GU:  Bladder non-palpable Extremities: +2 bilateral lower extremity edema Skin:  No skin rash Psych:  normal mood and affect Neuro:  no focal deficits   CURRENT MEDICATIONS:  . heparin  5,000 Units Subcutaneous Q8H  . insulin aspart  0-5 Units Subcutaneous QHS  . insulin aspart  0-9 Units Subcutaneous TID WC  . ipratropium-albuterol  3 mL Nebulization Q6H  . midodrine  10 mg Oral TID WC     HOME MEDICATIONS:  Prior to Admission medications   Medication Sig Start Date End Date Taking? Authorizing Provider  acetaminophen (TYLENOL) 500 MG tablet Take 1,000 mg by mouth every 6 (six) hours as needed  for mild pain.   Yes [provider]  albuterol (PROVENTIL HFA;VENTOLIN HFA) 108 (90 Base) MCG/ACT inhaler Inhale 2 puffs into the lungs every 4 (four) hours as needed for wheezing or shortness of breath.    Yes [provider]  amiodarone (PACERONE) 200 MG tablet Take 200 mg by mouth daily.   Yes [provider]  anastrozole (ARIMIDEX) 1 MG tablet Take 1 mg by mouth daily.   Yes [provider]  aspirin EC 81 MG tablet Take 81 mg by mouth daily.   Yes [provider]  atorvastatin (LIPITOR) 20 MG tablet Take 20 mg by mouth daily.   Yes [provider]  bisoprolol (ZEBETA) 5 MG tablet TAKE 1 TABLET BY MOUTH ONCE DAILY. Patient taking  differently: Take 5 mg by mouth daily.  09/04/18  Yes Tanda Rockers, MD  budesonide (PULMICORT) 0.5 MG/2ML nebulizer solution Inhale 2 mLs into the lungs every 6 (six) hours as needed for shortness of breath. 06/23/18  Yes [provider]  budesonide-formoterol (SYMBICORT) 80-4.5 MCG/ACT inhaler Take 2 puffs first thing in am and then another 2 puffs about 12 hours later. 06/01/17  Yes Tanda Rockers, MD  insulin glargine (LANTUS) 100 UNIT/ML injection Inject 50 Units into the skin at bedtime.   Yes [provider]  insulin lispro (HUMALOG) 100 UNIT/ML injection Inject 10 Units into the skin every evening.    Yes [provider]  ipratropium-albuterol (DUONEB) 0.5-2.5 (3) MG/3ML SOLN Take 3 mLs by nebulization every 4 (four) hours as needed.   Yes [provider]  loratadine (CLARITIN) 10 MG tablet Take 10 mg by mouth daily as needed for allergies.   Yes [provider]  meclizine (ANTIVERT) 25 MG tablet Take 25 mg by mouth every 6 (six) hours as needed for dizziness.    Yes [provider]  olmesartan (BENICAR) 40 MG tablet Take 40 mg by mouth daily. 09/04/18  Yes [provider]  OXYGEN 3 L continuous. AHC   Yes [provider]  torsemide (DEMADEX) 20 MG tablet Take 40 mg by mouth daily. 09/04/18  Yes [provider]  [place] test strip  02/06/18   [provider]       LABS:  CBC Latest Ref Rng & Units 04/05/2017  WBC 4.0 - 10.5 K/uL 4.7  Hemoglobin 12.0 - 15.0 g/dL 12.1  Hematocrit 36.0 - 46.0 % 38.3  Platelets 150.0 - 400.0 K/uL 200.0    CMP Latest Ref Rng & Units 10/27/2018 06/01/2017 04/05/2017  Glucose 70 - 99 mg/dL 120(H) 240(H) 87  BUN 8 - 23 mg/dL 95(H) 36(H) 32(H)  Creatinine 0.44 - 1.00 mg/dL 3.73(H) 1.27(H) 1.18  Sodium 135 - 145 mmol/L 136 138 140  Potassium 3.5 - 5.1 mmol/L 5.2(H) 4.5 4.2  Chloride 98 - 111 mmol/L 99 99 99  CO2 22 - 32 mmol/L 28 33(H) 35(H)  Calcium 8.9 -  10.3 mg/dL 8.0(L) 9.0 9.4  Total Protein 6.5 - 8.1 g/dL 7.2 - -  Total Bilirubin 0.3 - 1.2 mg/dL 0.8 - -  Alkaline Phos 38 - 126 U/L 186(H) - -  AST 15 - 41 U/L 40 - -  ALT 0 - 44 U/L 17 - -    Lab Results  Component Value Date   CALCIUM 8.0 (L) 10/27/2018    No results found for: COLORURINE, APPEARANCEUR, LABSPEC, PHURINE, GLUCOSEU, HGBUR, BILIRUBINUR, KETONESUR, PROTEINUR, UROBILINOGEN, NITRITE, LEUKOCYTESUR No results found for: PHART, PCO2ART, PO2ART, HCO3, TCO2,  ACIDBASEDEF, O2SAT  No results found for: IRON, TIBC, FERRITIN, IRONPCTSAT     ASSESSMENT/PLAN:    The patient is a 68 y.o. year old female patient with a past medical history significant for type 2 diabetes for more than 10 years, hyperlipidemia, hypertension, COPD, and breast cancer who presented as a transfer from [place] due to concerns for worsening renal function.  She was noted to have moderate to severe pulmonary hypertension with diastolic dysfunction.  Per the hospitalist at [facility], they were having difficulty diuresing her with worsening renal function with increasing doses of diuretics without improvement in urine output.  The patient complains of shortness of breath that has been ongoing for several weeks.   1.  Chronic kidney disease stage III.  Her baseline serum creatinine runs around 1.27 from 06/01/2017.  I suspect this is on the basis of longstanding diabetes and hypertension.  2.  Acute kidney injury.  She appears to have significant right-sided congestive heart failure.  An echocardiogram revealed significant pulmonary hypertension.  Would recommend cardiac evaluation.  Will attempt to diurese with a furosemide drip and acetazolamide, and may add zaroxolyn.  Has had about 1 L of urine output since she has been here.  No urgent need for dialysis today.  Will monitor response to diuresis and make a daily dialysis decision.  3.  Hyperkalemia.  This is certainly mild.  Should improve with diuresis.  4.   Pulmonary hypertension.  As above.  We will ask cardiology to evaluate.  Suspect related to underlying COPD.  Suspect significant right-sided heart failure.  5.  Diabetes mellitus.  Blood sugar control appears reasonable.    [place], DO, [place]

## 2018-10-27 NOTE — Progress Notes (Signed)
Patient arrived to unit with Lasix gtt infusing at 40m/hr. Dr RMarlowe Saxnotified [facility]at there was no order to continue or discontinue the lasix drip. Still waiting for order to d/c or continue the drip. The lasix bag from [place] finished therefor it was stopped by the nurse..[name] Kitchen

## 2018-10-27 NOTE — Progress Notes (Signed)
Patient was admitted earlier today.    Patient is a [name], transferred from another hospital to [facility] due to concerns for worsening renal function.  Patient's past medical history includes breast cancer, morbid obesity, hypertension, hyperlipidemia, diabetes mellitus, chronic kidney disease and COPD on 3 L of oxygen.  Patient's baseline serum creatinine is not known to me.  Patient was reported to have left-sided chest pain, shortness of breath, dyspnea on exertion, orthopnea, leg edema and 50 pound weight gain.  Echocardiogram reveals moderate to severe pulmonary hypertension with likely diastolic dysfunction.  BUN was 85 and serum creatinine of 3.3 prior to discharge, with mildly elevated potassium level.  Nephrology input is awaited.  No new complaints today.  On examination, patient is morbidly obese, not in any distress. Lungs: Decreased air entry with expiratory wheeze.   CVS: K1-C2, with systolic murmur. Abdomen: Morbidly obese.  Organs are difficult to assess. Neuro exam: Awake and alert.  Patient moves all extremities. Extremities: Bilateral [place] boots.  Bilateral leg edema to the thighs.  Assessment/plan: AKI on CKD, suspect significant role from right-sided heart pressures. Morbid obesity Pulmonary edema/diastolic heart failure/right-sided heart failure Pulmonary hypertension with possible cor pulmonale Chronic respiratory failure/COPD on 3 L of supplemental oxygen at home Mild hyperkalemia Hypoalbuminemia  This is a difficult situation.  Right-sided evaluated leading to worsening renal function can be quite challenging to treat.  Prognosis is guarded.  Albumin is 2.8.  Will try IV albumin, while awaiting nephrology input.  Possible role for slow dialysis with significant ultrafiltration.  We need to monitor renal function closely.  Hypotension-noted this secondary to right-sided heart failure.  High bicarbonate is noted.  High bicarbonate is likely  indicative of intravascular volume depletion versus rule out significant right-sided heart failure.  Guarded prognosis.  Low threshold to consult the palliative care team.

## 2018-10-27 NOTE — H&P (Signed)
History and Physical    Tammy Mathews YSA:[id number] DOB: February 04, 1951 DOA: 10/27/2018  PCP: Greig Right, MD Patient coming from: [facility]  Chief Complaint: Dyspnea on exertion  HPI: Tammy Mathews is a 68 y.o. female with medical history significant of breast cancer in remission, morbid obesity, hypertension, hyperlipidemia, type 2 diabetes, COPD and chronic hypoxic respiratory failure on 3 L home oxygen presented to [facility] with complains of left-sided chest pain, progressive shortness of breath, orthopnea, 50 pound weight gain over the last several months, and bilateral lower extremity edema. Per discharge summary: Found to have anasarca and mildly hypotensive.  Chest pain was thought to be atypical/musculoskeletal and ACS was ruled out.  3 sets of troponins were negative.  No arrhythmia noted.  Patient found to have AKI on advanced CKD.  Initially this was thought to be due to cardiorenal syndrome but her creatinine worsened with borderline hyperkalemia despite diuretics.  Foley catheter was placed.  Renal ultrasound without hydronephrosis.  Urinalysis bland with mild hyaline casts.  Patient was started on Lasix drip for acutely decompensated CHF, likely diastolic with anasarca.  She was not able to tolerate Lasix so midodrine was initiated.  She had hyperkalemia on presentation and despite diuresis potassium remained high. She was seen by cardiology (Dr. Bettina Gavia) at [facility] and it was thought that her advanced CKD was causing fluid overload rather than CHF.  He adviced against beta-blockers or vasodilators.  ACE inhibitors were avoided due to borderline hyperkalemia and worsening renal function.  2D echocardiogram done on May 10, 2018 showing LVEF 55 to 32% with diastolic filling pattern indicating impaired relaxation.  Right ventricular systolic pressure was elevated at 60 mmHg.  Repeat echocardiogram done during this hospitalization revealed LVEF 55 to 60% with  moderate to severe tricuspid regurgitation and right ventricle systolic pressure of 66 mmHg.  CT chest with some vascular congestion, small bilateral pleural effusions, and abdominal ascites. Labs done on June 18: Creatinine 3.1, potassium 6.0, GFR 15 Labs done on June 20: Creatinine 3.6, potassium 5.5, GFR 13, bicarb 28  Hospitalist Dr. Louanne Belton at [place] discussed the case with nephrology Dr. Grayland Ormond who recommended transfer to [facility] as patient might need hemodialysis.    Patient reports a history of progressive dyspnea on exertion, orthopnea, and bilateral lower extremity edema.  States she has gained about 50 pounds since March this year.  Denies any chest pain.  Denies any fevers or chills.  Denies any nausea, vomiting, abdominal pain, or diarrhea.  States she uses 3 L home oxygen all the time.  States she has Unna boots in place for her bilateral lower extremity edema.  Review of Systems:  All systems reviewed and apart from history of presenting illness, are negative.  Past Medical History:  Diagnosis Date   Breast cancer ([place])    COPD (chronic obstructive pulmonary disease) ([place])    Diabetes mellitus type 2 in obese ([place])    HLD (hyperlipidemia)    HTN (hypertension)    Morbid obesity ([place])     Past Surgical History:  Procedure Laterality Date   BREAST SURGERY     HERNIA REPAIR       reports that she has never smoked. She has never used smokeless tobacco. She reports that she does not drink alcohol or use drugs.  Allergies  Allergen Reactions   Codeine Nausea And Vomiting and Other (See Comments)    Unknown     Family History  Problem Relation Age of Onset   Heart disease  Mother    Colon cancer Mother    Breast cancer Mother    Emphysema Father        smoked   Emphysema Brother        smoked    Prior to Admission medications   Medication Sig Start Date End Date Taking? Authorizing Provider  ACCU-CHEK AVIVA PLUS test strip  02/06/18   [provider]  albuterol (PROVENTIL HFA;VENTOLIN HFA) 108 (90 Base) MCG/ACT inhaler Inhale 2 puffs into the lungs every 4 (four) hours as needed for wheezing or shortness of breath.     [provider]  amiodarone (PACERONE) 200 MG tablet Take 200 mg by mouth daily.    [provider]  anastrozole (ARIMIDEX) 1 MG tablet Take 1 mg by mouth daily.    [provider]  aspirin EC 81 MG tablet Take 81 mg by mouth daily.    [provider]  atorvastatin (LIPITOR) 20 MG tablet Take 20 mg by mouth daily.    [provider]  bisoprolol (ZEBETA) 5 MG tablet TAKE 1 TABLET BY MOUTH ONCE DAILY. 09/04/18   Tanda Rockers, MD  budesonide-formoterol (SYMBICORT) 80-4.5 MCG/ACT inhaler Take 2 puffs first thing in am and then another 2 puffs about 12 hours later. 06/01/17   Tanda Rockers, MD  furosemide (LASIX) 20 MG tablet 2 am and 1 pm    [provider]  insulin glargine (LANTUS) 100 UNIT/ML injection Inject 50 Units into the skin at bedtime.    [provider]  insulin lispro (HUMALOG) 100 UNIT/ML injection Inject 10 Units into the skin every evening.     [provider]  ipratropium-albuterol (DUONEB) 0.5-2.5 (3) MG/3ML SOLN Take 3 mLs by nebulization every 4 (four) hours as needed.    [provider]  losartan (COZAAR) 100 MG tablet Take 1 tablet (100 mg total) by mouth daily. 04/05/17   Tanda Rockers, MD  meclizine (ANTIVERT) 25 MG tablet Take 25 mg by mouth every 6 (six) hours as needed for dizziness.     [provider]  OXYGEN Oxygen 2lpm 24/7  [facility]    [provider]    Physical Exam: Vitals:   10/27/18 0235 10/27/18 0500 10/27/18 0551  BP: (!) 90/46  113/81  Pulse: 63  (!) 59  Resp: (!) 21    Temp: 98.5 F (36.9 C)  97.6 F (36.4 C)  TempSrc: Oral  Oral  SpO2: 99%  97%  Weight: (!) 143.4 kg (!) 143.4 kg   Height: _0  (1.549 m)      Physical Exam  Constitutional: She is oriented to  person, place, and time. She appears well-developed and well-nourished. No distress.  Morbidly obese  HENT:  Head: Normocephalic.  Mouth/Throat: Oropharynx is clear and moist.  Eyes: Right eye exhibits no discharge. Left eye exhibits no discharge.  Neck: Neck supple.  Cardiovascular: Normal rate, regular rhythm and intact distal pulses.  Pulmonary/Chest: Effort normal. No respiratory distress. She has rales.  Bibasilar rales Very mild end expiratory wheezing Speaking clearly in full sentences On 3 L supplemental oxygen (same as home requirement)  Abdominal: Soft. Bowel sounds are normal. She exhibits distension. There is no abdominal tenderness. There is no rebound and no guarding.  Musculoskeletal:        General: No edema.  Neurological: She is alert and oriented to person, place, and time.  Skin: Skin is warm and dry. She is not diaphoretic.     Labs on  Admission: I have personally reviewed following labs and imaging studies  CBC: No results for input(s): WBC, NEUTROABS, HGB, HCT, MCV, PLT in the last 168 hours. Basic Metabolic Panel: No results for input(s): NA, K, CL, CO2, GLUCOSE, BUN, CREATININE, CALCIUM, MG, PHOS in the last 168 hours. GFR: CrCl cannot be calculated (Patient's most recent lab result is older than the maximum 21 days allowed.). Liver Function Tests: No results for input(s): AST, ALT, ALKPHOS, BILITOT, PROT, ALBUMIN in the last 168 hours. No results for input(s): LIPASE, AMYLASE in the last 168 hours. No results for input(s): AMMONIA in the last 168 hours. Coagulation Profile: No results for input(s): INR, PROTIME in the last 168 hours. Cardiac Enzymes: No results for input(s): CKTOTAL, CKMB, CKMBINDEX, TROPONINI in the last 168 hours. BNP (last 3 results) No results for input(s): PROBNP in the last 8760 hours. HbA1C: No results for input(s): HGBA1C in the last 72 hours. CBG: No results for input(s): GLUCAP in the last 168 hours. Lipid Profile: No  results for input(s): CHOL, HDL, LDLCALC, TRIG, CHOLHDL, LDLDIRECT in the last 72 hours. Thyroid Function Tests: No results for input(s): TSH, T4TOTAL, FREET4, T3FREE, THYROIDAB in the last 72 hours. Anemia Panel: No results for input(s): VITAMINB12, FOLATE, FERRITIN, TIBC, IRON, RETICCTPCT in the last 72 hours. Urine analysis: No results found for: COLORURINE, APPEARANCEUR, LABSPEC, PHURINE, GLUCOSEU, HGBUR, BILIRUBINUR, KETONESUR, PROTEINUR, UROBILINOGEN, NITRITE, LEUKOCYTESUR  Radiological Exams on Admission: No results found.  Assessment/Plan Principal Problem:   Volume overload Active Problems:   AKI (acute kidney injury) ([place])   CKD (chronic kidney disease)   Chronic diastolic CHF (congestive heart failure) (HCC)   COPD (chronic obstructive pulmonary disease) (HCC)   Significant volume overload secondary to AKI on advanced CKD with hyperkalemia Creatinine 3.1 and potassium 6.0 on the date of admission at [facility].  After Lasix, creatinine worsened to 3.6 and only minimal improvement in potassium.  Renal ultrasound without hydronephrosis.  UA bland with mild hyaline casts.  Blood pressure soft and not able to tolerate additional Lasix.  Significantly volume overloaded on exam. -Discussed with nephrology as patient will likely need hemodialysis.  Nephrology will consult in a.m. -Hold Lasix due to low blood pressure -Midorine for blood pressure support -Foley catheter placed at outside hospital due to low urine output -Strict intake and output, fluid restriction -Monitor renal function and potassium level  Diastolic congestive heart failure Patient was seen by cardiology at [facility] and her advanced CKD was thought to be the reason for volume overload rather than acutely decompensated CHF.  Cardiology advised against beta-blockers or vasodilators at this time.  Avoid Ace inhibitors due to borderline hyperkalemia and worsening renal function. -Nephrology consultation for dialysis as  mentioned above -Strict intake and output, fluid restriction -Not able to tolerate Lasix due to worsening renal function and low blood pressure  Mild hyperkalemia In the setting of worsening renal function. Patient received temporizing [facility] at [facility].  Only minimal improvement with Lasix. -Nephrology consultation for dialysis as mentioned above -EKG  History of COPD with chronic hypoxic respiratory failure on chronic 3 L oxygen at home -Currently on 3 L oxygen (same as home requirement) -Has very mild end expiratory wheezing on exam.  Duo nebs every 6 hours.  -Continue supplemental oxygen  Essential hypertension presenting with hypotension No signs of sepsis.  Blood pressure has slightly improved with initiation of midodrine. -Continue Midodrine  History of breast cancer in remission -Outpatient oncology follow-up  Type 2 diabetes -Sliding scale insulin sensitive and CBG  checks  Unable to safely order home medications at this time as pharmacy medication reconciliation is pending.  DVT prophylaxis: Subcutaneous heparin Code Status: Patient wishes to be full code. Family Communication: No family available. Disposition Plan: Anticipate discharge after clinical improvement. Consults called: Nephrology (Dr. Grayland Ormond) Admission status: It is my clinical opinion that admission to INPATIENT is reasonable and necessary in this 68 y.o. female  presenting with significant volume overload secondary to AKI on advanced CKD.  Nephrology has been consulted.  Patient will most likely need initiation of hemodialysis.  Given the aforementioned, the predictability of an adverse outcome is felt to be significant. I expect that the patient will require at least 2 midnights in the hospital to treat this condition.   The medical decision making on this patient was of high complexity and the patient is at high risk for clinical deterioration, therefore this is a level 3 visit.  Shela Leff MD [facility] Pager [phone]  If 7PM-7AM, please contact night-coverage [email or web address] Password [facility]  10/27/2018, 7:05 AM

## 2018-10-27 NOTE — Progress Notes (Signed)
Called by bedside RN with concerns for pt respiratory status. On assessment, pt is tachypneic (RR in the 30's on Bipap) and BP soft but VS are otherwise stable. Lung sounds are diminished throughout and pt continues to c/o SOB. She has become extremely anxious and states that she is going to die. She is asking to be intubated at this time.  SOB/Tachypnea  -ABG stat - Chest x-ray stat -Continue with Bipap -Consulted PCCM for further managament - Ativan 0.5MG IV given  Fluid overload -Despite being on a lasix gtt for diuresis, pt has an output of only 250. Consulted nephrology for possible HD tonight. Advised to increase lasix to 80m/hr, continue with bipap and start metolazone 129mpo. Unable to take pt off of bipap at this time so metolazone not ordered.   XeLovey NewcomerNP CRITICAL CARE Performed by: XeNeila Gear Total critical care time: 45 minutes  Critical care time was exclusive of separately billable procedures and treating other patients.  Critical care was necessary to treat or prevent imminent or life-threatening deterioration.  Critical care was time spent personally by me on the following activities: development of treatment plan with patient and/or surrogate as well as nursing, discussions with consultants, evaluation of patient's response to treatment, examination of patient, obtaining history from patient or surrogate, ordering and performing treatments and interventions, ordering and review of laboratory studies, ordering and review of radiographic studies, pulse oximetry and re-evaluation of patient's condition. [facility] [phone] [phone]

## 2018-10-28 ENCOUNTER — Inpatient Hospital Stay (HOSPITAL_COMMUNITY): Payer: Medicare Other

## 2018-10-28 DIAGNOSIS — J9622 Acute and chronic respiratory failure with hypercapnia: Secondary | ICD-10-CM

## 2018-10-28 DIAGNOSIS — I371 Nonrheumatic pulmonary valve insufficiency: Secondary | ICD-10-CM

## 2018-10-28 DIAGNOSIS — N179 Acute kidney failure, unspecified: Principal | ICD-10-CM

## 2018-10-28 DIAGNOSIS — J9621 Acute and chronic respiratory failure with hypoxia: Secondary | ICD-10-CM

## 2018-10-28 DIAGNOSIS — N183 Chronic kidney disease, stage 3 (moderate): Secondary | ICD-10-CM

## 2018-10-28 DIAGNOSIS — I361 Nonrheumatic tricuspid (valve) insufficiency: Secondary | ICD-10-CM

## 2018-10-28 LAB — ECHOCARDIOGRAM COMPLETE
Height: 61 in
Weight: 5019.43 oz

## 2018-10-28 LAB — BASIC METABOLIC PANEL
Anion gap: 12 (ref 5–15)
BUN: 94 mg/dL — ABNORMAL HIGH (ref 8–23)
CO2: 27 mmol/L (ref 22–32)
Calcium: 8.3 mg/dL — ABNORMAL LOW (ref 8.9–10.3)
Chloride: 99 mmol/L (ref 98–111)
Creatinine, Ser: 3.34 mg/dL — ABNORMAL HIGH (ref 0.44–1.00)
GFR calc Af Amer: 15 mL/min — ABNORMAL LOW (ref >60)
GFR calc non Af Amer: 13 mL/min — ABNORMAL LOW (ref >60)
Glucose, Bld: 145 mg/dL — ABNORMAL HIGH (ref 70–99)
Potassium: 4.3 mmol/L (ref 3.5–5.1)
Sodium: 138 mmol/L (ref 135–145)

## 2018-10-28 LAB — RENAL FUNCTION PANEL
Albumin: 3.2 g/dL — ABNORMAL LOW (ref 3.5–5.0)
Anion gap: 12 (ref 5–15)
BUN: 97 mg/dL — ABNORMAL HIGH (ref 8–23)
CO2: 26 mmol/L (ref 22–32)
Calcium: 8.4 mg/dL — ABNORMAL LOW (ref 8.9–10.3)
Chloride: 100 mmol/L (ref 98–111)
Creatinine, Ser: 3.71 mg/dL — ABNORMAL HIGH (ref 0.44–1.00)
GFR calc Af Amer: 14 mL/min — ABNORMAL LOW (ref >60)
GFR calc non Af Amer: 12 mL/min — ABNORMAL LOW (ref >60)
Glucose, Bld: 172 mg/dL — ABNORMAL HIGH (ref 70–99)
Phosphorus: 5.8 mg/dL — ABNORMAL HIGH (ref 2.5–4.6)
Potassium: 5 mmol/L (ref 3.5–5.1)
Sodium: 138 mmol/L (ref 135–145)

## 2018-10-28 LAB — CBC
HCT: 29.9 % — ABNORMAL LOW (ref 36.0–46.0)
Hemoglobin: 8.9 g/dL — ABNORMAL LOW (ref 12.0–15.0)
MCH: 26.2 pg (ref 26.0–34.0)
MCHC: 29.8 g/dL — ABNORMAL LOW (ref 30.0–36.0)
MCV: 87.9 fL (ref 80.0–100.0)
Platelets: 209 10*3/uL (ref 150–400)
RBC: 3.4 MIL/uL — ABNORMAL LOW (ref 3.87–5.11)
RDW: 16 % — ABNORMAL HIGH (ref 11.5–15.5)
WBC: 8.9 10*3/uL (ref 4.0–10.5)
nRBC: 0 % (ref 0.0–0.2)

## 2018-10-28 LAB — MRSA PCR SCREENING: MRSA by PCR: POSITIVE — AB

## 2018-10-28 LAB — GLUCOSE, CAPILLARY
Glucose-Capillary: 167 mg/dL — ABNORMAL HIGH (ref 70–99)
Glucose-Capillary: 146 mg/dL — ABNORMAL HIGH (ref 70–99)
Glucose-Capillary: 131 mg/dL — ABNORMAL HIGH (ref 70–99)
Glucose-Capillary: 147 mg/dL — ABNORMAL HIGH (ref 70–99)

## 2018-10-28 LAB — HIV ANTIBODY (ROUTINE TESTING W REFLEX): HIV Screen 4th Generation wRfx: NONREACTIVE

## 2018-10-28 LAB — MAGNESIUM: Magnesium: 2.9 mg/dL — ABNORMAL HIGH (ref 1.7–2.4)

## 2018-10-28 MED ORDER — MIDAZOLAM HCL 2 MG/2ML IJ SOLN: 2.0000 mg | Freq: Once | INTRAMUSCULAR | Status: DC

## 2018-10-28 MED ORDER — AMIODARONE HCL 200 MG PO TABS
200.0000 mg | ORAL_TABLET | Freq: Every day | ORAL | Status: DC
  Administered 2018-10-28 – 2018-11-04 (×8): 200 mg via ORAL
  Filled 2018-10-28 (×9): qty 1

## 2018-10-28 MED ORDER — IPRATROPIUM-ALBUTEROL 0.5-2.5 (3) MG/3ML IN SOLN
3.0000 mL | RESPIRATORY_TRACT | Status: DC
  Administered 2018-10-28 (×4): 3 mL via RESPIRATORY_TRACT
  Filled 2018-10-28 (×4): qty 3

## 2018-10-28 MED ORDER — ATORVASTATIN CALCIUM 10 MG PO TABS
20.0000 mg | ORAL_TABLET | Freq: Every day | ORAL | Status: DC
  Administered 2018-10-28 – 2018-11-08 (×12): 20 mg via ORAL
  Filled 2018-10-28 (×12): qty 2

## 2018-10-28 MED ORDER — MUPIROCIN 2 % EX OINT
1.0000 "application " | TOPICAL_OINTMENT | Freq: Two times a day (BID) | CUTANEOUS | Status: AC
  Administered 2018-10-28 – 2018-11-01 (×9): 1 via NASAL
  Filled 2018-10-28 (×2): qty 22

## 2018-10-28 MED ORDER — ASPIRIN EC 81 MG PO TBEC
81.0000 mg | DELAYED_RELEASE_TABLET | Freq: Every day | ORAL | Status: DC
  Administered 2018-10-28 – 2018-11-08 (×11): 81 mg via ORAL
  Filled 2018-10-28 (×12): qty 1

## 2018-10-28 MED ORDER — MOMETASONE FURO-FORMOTEROL FUM 100-5 MCG/ACT IN AERO
2.0000 | INHALATION_SPRAY | Freq: Two times a day (BID) | RESPIRATORY_TRACT | Status: DC
  Administered 2018-10-28 – 2018-11-08 (×22): 2 via RESPIRATORY_TRACT
  Filled 2018-10-28: qty 8.8

## 2018-10-28 MED ORDER — IPRATROPIUM-ALBUTEROL 0.5-2.5 (3) MG/3ML IN SOLN
3.0000 mL | Freq: Four times a day (QID) | RESPIRATORY_TRACT | Status: DC
  Administered 2018-10-28 – 2018-10-30 (×7): 3 mL via RESPIRATORY_TRACT
  Filled 2018-10-28 (×7): qty 3

## 2018-10-28 MED ORDER — FENTANYL CITRATE (PF) 100 MCG/2ML IJ SOLN: 100.0000 ug | Freq: Once | INTRAMUSCULAR | Status: DC

## 2018-10-28 MED ORDER — CHLORHEXIDINE GLUCONATE CLOTH 2 % EX PADS
6.0000 | MEDICATED_PAD | Freq: Every day | CUTANEOUS | Status: DC
  Administered 2018-10-29 – 2018-11-08 (×10): 6 via TOPICAL

## 2018-10-28 MED ORDER — METOLAZONE 5 MG PO TABS
5.0000 mg | ORAL_TABLET | Freq: Once | ORAL | Status: AC
  Administered 2018-10-28: 5 mg via ORAL
  Filled 2018-10-28: qty 1

## 2018-10-28 MED ORDER — CHLORHEXIDINE GLUCONATE CLOTH 2 % EX PADS
6.0000 | MEDICATED_PAD | Freq: Every day | CUTANEOUS | Status: AC
  Administered 2018-10-28 – 2018-11-01 (×4): 6 via TOPICAL

## 2018-10-28 NOTE — Progress Notes (Signed)
  Echocardiogram 2D Echocardiogram has been performed.  Tammy Mathews L Androw 10/28/2018, 10:54 AM

## 2018-10-28 NOTE — Progress Notes (Signed)
Name: Tammy Mathews MRN: [id number] DOB: [birth year] Date of service : 10/28/18 CC: SOB   LOS: 1  [place] [facility] / Critical Care Note   History of Present Illness: [name], transferred from another hospital to [facility] due to concerns for worsening renal function and shortness of breath.  HX includes breast cancer, morbid obesity, hypertension, hyperlipidemia, diabetes mellitus, chronic kidney disease and COPD on 3 L of oxygen.  Patient was reported to have left-sided chest pain, shortness of breath, dyspnea on exertion, orthopnea, leg edema and 50 pound weight gain in the last month.   Previous Echocardiogram reveals moderate to severe pulmonary hypertension with likely diastolic dysfunction.   She was admitted to cardiology floor 6/21 but has gotten progressively worsening shortness of breath, needing BIPAP . Lasix drip initiated by nephrology and thus far very poor urine output.   Lines / Drains: Right subclavian port   Cultures: COVID neg   Antibiotics: NONE   Tests / Events: TX from outside hospital  SUBJ -dyspnea is improved Denies chest pain Afebrile Off BiPAP this morning  Vital Signs: Temp:  [97.7 F (36.5 C)-99.8 F (37.7 C)] 98.3 F (36.8 C) (06/22 1100) Pulse Rate:  [58-98] 61 (06/22 1300) Resp:  [0-59] 20 (06/22 1300) BP: (66-157)/(38-141) 106/54 (06/22 1300) SpO2:  [80 %-100 %] 98 % (06/22 1300) FiO2 (%):  [30 %] 30 % (06/22 0818) Weight:  [139.1 kg-142.3 kg] 142.3 kg (06/22 0500) I/O last 3 completed shifts: In: 370 [P.O.:480; I.V.:230.6; IV Piggyback:123.4] Out: 1850 [Urine:1850]  Physical Examination: General:  obese , anxious affect, no distress Neuro: Alert, interactive moves all extremities and follows commands , PERRLA , EOMI CV: tachycardic , no mr/g PULM: Decreased breath sounds bilateral, no ronchi or wheezing GI: Obese , bs+ , soft nt nd Extremities: scd's in place , swelling and venous stasis changes noted in LE .     Chest x-ray 6/21 personally reviewed which shows bilateral atelectasis and small effusions, elevated right hemidiaphragm  Ventilator settings: FiO2 (%):  [30 %] 30 %   Assessment and Plan: Principal Problem:   Acute on chronic respiratory failure with hypoxia and hypercapnia (HCC) Active Problems:   Volume overload   AKI (acute kidney injury) ([place])   CKD (chronic kidney disease)   Chronic diastolic CHF (congestive heart failure) (HCC)   COPD (chronic obstructive pulmonary disease) (HCC)   Acute respiratory distress   Acute-on-chronic renal failure (HCC)   Acute on chronic resp failure with hypoxemia and hypercarbia secondary to Cardio-renal syndrome, right sided heart failure, no signs on infection. Sleep study in the past did not show OSA Wonder if elevated right hemidiaphragm plays a role  Plan-continue Lasix drip BiPAP to be used as needed and nightly   Acute toxic encelophathy secondary to resp distress -resolved   Acute on chronic diastolic CHF , hx of severe pulm HTN , right sided heart failure, cor pulmonale  Levophed for MAP > 65 if precedex drops BP Amiodarone and aspirin , suspect she has AFIB    Acute on ckd 3 -Lasix drip, titrate to urine output DM , SSI, goal fs < 180   Kara Mead MD. FCCP. [place] [facility] Pager [phone] If no response call [phone]    10/28/2018, 2:18 PM

## 2018-10-28 NOTE — Plan of Care (Signed)
  Problem: Elimination: Goal: Will not experience complications related to urinary retention Outcome: Progressing Note: Foley in place.  No s/s of urinary retention.   Problem: Coping: Goal: Level of anxiety will decrease Outcome: Progressing Note: Anxiety improved with IV ativan.

## 2018-10-28 NOTE — Progress Notes (Signed)
[facility] Progress Note Patient Name: Tammy Mathews DOB: 05-20-1950 MRN: [id number]   Date of Service  10/28/2018  HPI/Events of Note  67/F brought in due to shortness of breath and worsening renal dysfunction.  Pt was started on Lasix gtt by renal but has had poor response since.  PT is on BIPAP.  She was tachypneic on the floors despite the BIPAP. Pt transferred to the ICU.  eICU Interventions  Continue Lasix gtt.  Pt is on BIPAP.  Continue precedex gtt.  Heparin SQ. Monitor I&Os.      Intervention Category Evaluation Type: New Patient Evaluation  Elsie Lincoln 10/28/2018, 1:08 AM

## 2018-10-28 NOTE — Progress Notes (Signed)
2330: Patient to transfer to [facility].  Report given to [facility] nurse. 2340: Patient transferred to 2H20 via bed on bipap with myself, respiratory therapist, [place] [facility] and rapid response nurse, [place]. 0030: Patient's sister, [name], notified that patient was transferred to [facility].  Update given on patient's status.  Questions answered.  [name] states she appreciates my phone call.  Jodell Cipro

## 2018-10-28 NOTE — Progress Notes (Signed)
Jeannette Corpus, NP at bedside.

## 2018-10-28 NOTE — Significant Event (Signed)
Re-assessed patient after transferring to the ICU. Started making urine with the lasix drip , 200 cc in the last 1.5 hrs , potassium trending down. Comfortable on the BIPAP at 30% FIO2. Needs levophed low dose when on precedex. Will hold off HD catheter placement. Consult palliative care. She will not due well long term given Severe pulm htn , obesity , corpulmonale , R sided HF and renal failure.

## 2018-10-28 NOTE — Progress Notes (Signed)
Patient called asking for nebulizer treatment.  She was just given a duoneb at 58.  On arrival to room, patient appears anxious, dyspneic, and tachypneic, stating "I can't breathe" and unable to talk in complete sentences without taking breaths in between.  Sats 90-91% on 3L/[address].  Lung sounds very diminished.  Jeannette Corpus, NP notified of the above, and Bipap ordered.  Respiratory therapist, [place] [facility], notified of NP's order for bipap.  Will continue to monitor.  Jodell Cipro

## 2018-10-28 NOTE — Progress Notes (Signed)
Rapid response nurse called by Sharee Pimple, charge nurse, d/t patient asking for help and c/o increased SOB on bipap.  Patient very anxious, stating she feels like she's going to die and stating she wants a "breathing tube."  O2 sat 98% on bipap at 30% FiO2.  RR 30s-40.  Other VSS.  Jeannette Corpus, NP notified by Sharee Pimple, RN.  ABG and CXR ordered.  Continue bipap.  Will continue to monitor.  Jodell Cipro

## 2018-10-28 NOTE — Consult Note (Signed)
Name: Tammy Mathews MRN: [id number] DOB: [birth year] Date of service : 10/28/18 CC: SOB   LOS: 1  [facility] / Critical Care Note   History of Present Illness: 67 year old Caucasian female, transferred from another hospital to [facility] due to concerns for worsening renal function and shortness of breath.  HX includes breast cancer, morbid obesity, hypertension, hyperlipidemia, diabetes mellitus, chronic kidney disease and COPD on 3 L of oxygen.  Patient was reported to have left-sided chest pain, shortness of breath, dyspnea on exertion, orthopnea, leg edema and 50 pound weight gain in the last month.   Previous Echocardiogram reveals moderate to severe pulmonary hypertension with likely diastolic dysfunction.   She was admitted to cardiology floor tonight but has gotten progressively worsening shortness of breath, needing BIPAP , very anxious asking to be intubated. Lasix drip initiated by nephrology and thus far very poor urine output. The patient says she has never been intubated. Quit smoking , no drugs. Feels better on the bipap but very anxious , ROS + SOB , denies any current chest pain , fevers, chills , n/v/d , rest of 12 point ROS done and negative pertinent positives are mentioned in the HPI  Lines / Drains: Right subclavian port   Cultures: COVID neg   Antibiotics: NONE   Tests / Events: TX from outside hospital     Past Medical History:  Diagnosis Date  . Breast cancer ([place])   . COPD (chronic obstructive pulmonary disease) ([place])   . Diabetes mellitus type 2 in obese ([place])   . HLD (hyperlipidemia)   . HTN (hypertension)   . Morbid obesity ([place])     Past Surgical History:  Procedure Laterality Date  . BREAST SURGERY    . HERNIA REPAIR      Prior to Admission medications   Medication Sig Start Date End Date Taking? Authorizing Provider  acetaminophen (TYLENOL) 500 MG tablet Take 1,000 mg by mouth every 6 (six) hours as needed for mild pain.   Yes  [provider]  albuterol (PROVENTIL HFA;VENTOLIN HFA) 108 (90 Base) MCG/ACT inhaler Inhale 2 puffs into the lungs every 4 (four) hours as needed for wheezing or shortness of breath.    Yes [provider]  amiodarone (PACERONE) 200 MG tablet Take 200 mg by mouth daily.   Yes [provider]  anastrozole (ARIMIDEX) 1 MG tablet Take 1 mg by mouth daily.   Yes [provider]  aspirin EC 81 MG tablet Take 81 mg by mouth daily.   Yes [provider]  atorvastatin (LIPITOR) 20 MG tablet Take 20 mg by mouth daily.   Yes [provider]  bisoprolol (ZEBETA) 5 MG tablet TAKE 1 TABLET BY MOUTH ONCE DAILY. Patient taking differently: Take 5 mg by mouth daily.  09/04/18  Yes Tanda Rockers, MD  budesonide (PULMICORT) 0.5 MG/2ML nebulizer solution Inhale 2 mLs into the lungs every 6 (six) hours as needed for shortness of breath. 06/23/18  Yes [provider]  budesonide-formoterol (SYMBICORT) 80-4.5 MCG/ACT inhaler Take 2 puffs first thing in am and then another 2 puffs about 12 hours later. 06/01/17  Yes Tanda Rockers, MD  insulin glargine (LANTUS) 100 UNIT/ML injection Inject 50 Units into the skin at bedtime.   Yes [provider]  insulin lispro (HUMALOG) 100 UNIT/ML injection Inject 10 Units into the skin every evening.    Yes [provider]  ipratropium-albuterol (DUONEB) 0.5-2.5 (3) MG/3ML SOLN Take 3 mLs by nebulization every 4 (four)  hours as needed.   Yes [provider]  loratadine (CLARITIN) 10 MG tablet Take 10 mg by mouth daily as needed for allergies.   Yes [provider]  meclizine (ANTIVERT) 25 MG tablet Take 25 mg by mouth every 6 (six) hours as needed for dizziness.    Yes [provider]  olmesartan (BENICAR) 40 MG tablet Take 40 mg by mouth daily. 09/04/18  Yes [provider]  OXYGEN 3 L continuous. [facility]   Yes [provider]  torsemide (DEMADEX) 20 MG tablet Take  40 mg by mouth daily. 09/04/18  Yes [provider]  [place] test strip  02/06/18   [provider]    Allergies Allergies  Allergen Reactions  . Codeine Nausea And Vomiting and Other (See Comments)    Unknown   . Morphine And Related Nausea And Vomiting    Family History Family History  Problem Relation Age of Onset  . Heart disease Mother   . Colon cancer Mother   . Breast cancer Mother   . Emphysema Father        smoked  . Emphysema Brother        smoked    Social History  reports that she has never smoked. She has never used smokeless tobacco. She reports that she does not drink alcohol or use drugs.  Review Of Systems:   Vital Signs: Temp:  [97.6 F (36.4 C)-99.2 F (37.3 C)] 99.2 F (37.3 C) (06/21 2308) Pulse Rate:  [59-92] 87 (06/21 2338) Resp:  [15-59] 15 (06/21 2338) BP: (90-118)/(41-81) 115/48 (06/21 2310) SpO2:  [95 %-100 %] 100 % (06/21 2338) FiO2 (%):  [30 %] 30 % (06/21 2123) Weight:  [143.4 kg] 143.4 kg (06/21 0500) I/O last 3 completed shifts: In: 480 [P.O.:480] Out: 1000 [Urine:1000]  Physical Examination: General: Morbidly obese , awake , following commands , very anxious Neuro:moves all extremities and follows commands , PERRLA , EOMI CV: tachycardic , no mr/g PULM: diffuse crackles , no ronchi or wheezing GI: Obese , bs+ , soft nt nd Extremities: scd's in place , swelling and venous stasis changes noted in LE .    Ventilator settings: FiO2 (%):  [30 %] 30 %  Labs    CBC Recent Labs  Lab 10/27/18 2210  HGB 8.7*  HCT 30.1*  WBC 8.2  PLT 196     BMET Recent Labs  Lab 10/27/18 1227 10/27/18 2249  NA 136 135  K 5.2* 5.0  CL 99 98  CO2 28 26  GLUCOSE 120* 182*  BUN 95* 95*  CREATININE 3.73* 3.65*  CALCIUM 8.0* 8.2*  PHOS  --  6.0*    No results for input(s): INR in the last 168 hours.  Recent Labs  Lab 10/27/18 2118  PHART 7.380  PCO2ART 47.5  PO2ART 79.2*  HCO3 27.4  O2SAT 96.1      Radiology: CXR - elevated R diaphragm, pulmonary edema    Assessment and Plan: Principal Problem:   Volume overload Active Problems:   AKI (acute kidney injury) (HCC)   CKD (chronic kidney disease)   Chronic diastolic CHF (congestive heart failure) (HCC)   COPD (chronic obstructive pulmonary disease) (HCC)   Acute respiratory distress  Neuro - acute toxic encelophathy secondary to resp distress Start Precedex drip  CVS- Acute on chronic diastolic CHF , hx of severe pulm HTN , right sided heart failure, cor pulmonale not responding to diuretics, will most likely need HD if doesn't start  making urine with lasix drip, check trops, TTE Levophed for MAP > 65 if precedex drops BP Amiodarone and aspirin , suspect she has AFIB  Lungs - acute on chronic resp failure with hypoxemia and hypercarbia secondary to Cardio-renal syndrome, right sided heart failure, no signs on infection. Will continue lasix drip , BIPAP support , no need for intubation at this time  Nephro - acute on ckd 3 , if doesn't make urine tonight will need HD , will place HD catheter pending clinical course   Hem - anemia of chronic disease Hx of breast cancer on remission  Endo - DM , SSI, goal fs < 180   Best practices / Disposition: -->Code Status: full -->DVT Px: hsq -->GI SL:HTDSKA -->Diet:NPO except meds  Critical care time spent : 40 minutes excluding procedures.   10/28/2018, 12:14 AM

## 2018-10-28 NOTE — Progress Notes (Addendum)
Patient continues to appear anxious and tachypneic, and c/o SOB, despite bipap.  Jeannette Corpus, NP notified.  IV ativan ordered and given per NP order.  Jeannette Corpus, NP states she will contact nephrology to inquire about the possibility of patient having HD tonight, and she will ask PCCM to assess patient for potential need for transfer to ICU.  Jodell Cipro

## 2018-10-28 NOTE — Progress Notes (Signed)
[facility]    NEPHROLOGY PROGRESS NOTE  SUBJECTIVE: Patient seen and examined earlier this morning.  Events noted.  Patient with improved oxygenation during my exam, and tolerating nasal cannula.  Lasix drip increased overnight.  Improved urine output this morning.  Continues to complain of some shortness of breath.  Denies any nausea, vomiting, diarrhea or dysuria.  All other review of systems are negative.  OBJECTIVE:  Vitals:   10/28/18 1400 10/28/18 1500  BP: (!) 117/50   Pulse: 64   Resp: 19   Temp:  98.2 F (36.8 C)  SpO2: 99%     Intake/Output Summary (Last 24 hours) at 10/28/2018 1613 Last data filed at 10/28/2018 1400 Gross per 24 hour  Intake 669.43 ml  Output 2250 ml  Net -1580.57 ml      General:  AAOx3, anxious HEENT: MMM [place] AT anicteric sclera Neck: Positive JVD, no adenopathy CV:  Heart RRR  Lungs:  L/S CTA bilaterally Abd:  abd SNT/ND with normal BS, obese GU:  Bladder non-palpable Extremities: +3 bilateral lower extremity edema. Skin:  No skin rash  MEDICATIONS:  . amiodarone  200 mg Oral Daily  . aspirin EC  81 mg Oral Daily  . atorvastatin  20 mg Oral Daily  . Chlorhexidine Gluconate Cloth  6 each Topical Q0600  . Chlorhexidine Gluconate Cloth  6 each Topical Daily  . heparin  5,000 Units Subcutaneous Q8H  . insulin aspart  0-5 Units Subcutaneous QHS  . insulin aspart  0-9 Units Subcutaneous TID WC  . ipratropium-albuterol  3 mL Nebulization Q6H  . midodrine  10 mg Oral TID WC  . mometasone-formoterol  2 puff Inhalation BID  . mupirocin ointment  1 application Nasal BID       LABS:   CBC Latest Ref Rng & Units 10/28/2018 10/27/2018 04/05/2017  WBC 4.0 - 10.5 K/uL 8.9 8.2 4.7  Hemoglobin 12.0 - 15.0 g/dL 8.9(L) 8.7(L) 12.1  Hematocrit 36.0 - 46.0 % 29.9(L) 30.1(L) 38.3  Platelets 150 - 400 K/uL 209 196 200.0    CMP Latest Ref Rng & Units 10/28/2018 10/28/2018 10/27/2018  Glucose 70 - 99 mg/dL 145(H) 172(H) 182(H)  BUN 8 - 23  mg/dL 94(H) 97(H) 95(H)  Creatinine 0.44 - 1.00 mg/dL 3.34(H) 3.71(H) 3.65(H)  Sodium 135 - 145 mmol/L 138 138 135  Potassium 3.5 - 5.1 mmol/L 4.3 5.0 5.0  Chloride 98 - 111 mmol/L 99 100 98  CO2 22 - 32 mmol/L _0 Calcium 8.9 - 10.3 mg/dL 8.3(L) 8.4(L) 8.2(L)  Total Protein 6.5 - 8.1 g/dL - - -  Total Bilirubin 0.3 - 1.2 mg/dL - - -  Alkaline Phos 38 - 126 U/L - - -  AST 15 - 41 U/L - - -  ALT 0 - 44 U/L - - -    Lab Results  Component Value Date   CALCIUM 8.3 (L) 10/28/2018   PHOS 5.8 (H) 10/28/2018    No results found for: COLORURINE, APPEARANCEUR, LABSPEC, PHURINE, GLUCOSEU, HGBUR, BILIRUBINUR, KETONESUR, PROTEINUR, UROBILINOGEN, NITRITE, LEUKOCYTESUR    Component Value Date/Time   PHART 7.380 10/27/2018 2118   PCO2ART 47.5 10/27/2018 2118   PO2ART 79.2 (L) 10/27/2018 2118   HCO3 27.4 10/27/2018 2118   O2SAT 96.1 10/27/2018 2118    No results found for: IRON, TIBC, FERRITIN, IRONPCTSAT     ASSESSMENT/PLAN:    The patient is a 68 y.o. year old female patient with a past medical history significant for type 2 diabetes  for more than 10 years, hyperlipidemia, hypertension, COPD, and breast cancer who presented as a transfer from [place] due to concerns for worsening renal function.  She was noted to have moderate to severe pulmonary hypertension with diastolic dysfunction.  Per the hospitalist at [facility], they were having difficulty diuresing her with worsening renal function with increasing doses of diuretics without improvement in urine output.  The patient complains of shortness of breath that has been ongoing for several weeks.  She reports she has had a significant amount of fluid that she has been having difficulty getting rid of.  The chart documents a 50 pound weight gain.  Renal consultation has been called for acute kidney injury, and chronic kidney disease, and difficulty with diuresis.  1.  Chronic kidney disease stage III.  Her baseline serum creatinine runs  around 1.27 from 06/01/2017.  I suspect this is on the basis of longstanding diabetes and hypertension.  2.  Acute kidney injury.  She appears to have significant right-sided congestive heart failure.  An echocardiogram revealed significant pulmonary hypertension.  Will attempt to diurese with a furosemide drip and acetazolamide, and 1 dose of Zaroxolyn today.  Urine output is picking up.  No urgent need for dialysis today and dialysis would certainly be tricky with her current hemodynamics. Will monitor response to diuresis and make a daily dialysis decision.  3.  Hyperkalemia.  This is certainly mild.  Should improve with diuresis.  4.  Pulmonary hypertension.  As above.  We will ask cardiology to evaluate.  Suspect related to underlying COPD.    5.  Diabetes mellitus.  Blood sugar control appears reasonable.      [place], DO, [place]

## 2018-10-29 DIAGNOSIS — Z515 Encounter for palliative care: Secondary | ICD-10-CM

## 2018-10-29 DIAGNOSIS — Z7189 Other specified counseling: Secondary | ICD-10-CM

## 2018-10-29 DIAGNOSIS — I5032 Chronic diastolic (congestive) heart failure: Secondary | ICD-10-CM

## 2018-10-29 LAB — BASIC METABOLIC PANEL
Anion gap: 13 (ref 5–15)
Anion gap: 10 (ref 5–15)
BUN: 89 mg/dL — ABNORMAL HIGH (ref 8–23)
BUN: 89 mg/dL — ABNORMAL HIGH (ref 8–23)
CO2: 29 mmol/L (ref 22–32)
CO2: 30 mmol/L (ref 22–32)
Calcium: 8.6 mg/dL — ABNORMAL LOW (ref 8.9–10.3)
Calcium: 8.6 mg/dL — ABNORMAL LOW (ref 8.9–10.3)
Chloride: 98 mmol/L (ref 98–111)
Chloride: 99 mmol/L (ref 98–111)
Creatinine, Ser: 2.97 mg/dL — ABNORMAL HIGH (ref 0.44–1.00)
Creatinine, Ser: 3.27 mg/dL — ABNORMAL HIGH (ref 0.44–1.00)
GFR calc Af Amer: 18 mL/min — ABNORMAL LOW (ref >60)
GFR calc Af Amer: 16 mL/min — ABNORMAL LOW (ref >60)
GFR calc non Af Amer: 15 mL/min — ABNORMAL LOW (ref >60)
GFR calc non Af Amer: 14 mL/min — ABNORMAL LOW (ref >60)
Glucose, Bld: 149 mg/dL — ABNORMAL HIGH (ref 70–99)
Glucose, Bld: 165 mg/dL — ABNORMAL HIGH (ref 70–99)
Potassium: 3.9 mmol/L (ref 3.5–5.1)
Potassium: 3.9 mmol/L (ref 3.5–5.1)
Sodium: 140 mmol/L (ref 135–145)
Sodium: 139 mmol/L (ref 135–145)

## 2018-10-29 LAB — CBC
HCT: 30.7 % — ABNORMAL LOW (ref 36.0–46.0)
Hemoglobin: 9.1 g/dL — ABNORMAL LOW (ref 12.0–15.0)
MCH: 26.1 pg (ref 26.0–34.0)
MCHC: 29.6 g/dL — ABNORMAL LOW (ref 30.0–36.0)
MCV: 88.2 fL (ref 80.0–100.0)
Platelets: 208 10*3/uL (ref 150–400)
RBC: 3.48 MIL/uL — ABNORMAL LOW (ref 3.87–5.11)
RDW: 15.9 % — ABNORMAL HIGH (ref 11.5–15.5)
WBC: 5.8 10*3/uL (ref 4.0–10.5)
nRBC: 0 % (ref 0.0–0.2)

## 2018-10-29 LAB — GLUCOSE, CAPILLARY
Glucose-Capillary: 174 mg/dL — ABNORMAL HIGH (ref 70–99)
Glucose-Capillary: 133 mg/dL — ABNORMAL HIGH (ref 70–99)
Glucose-Capillary: 147 mg/dL — ABNORMAL HIGH (ref 70–99)
Glucose-Capillary: 187 mg/dL — ABNORMAL HIGH (ref 70–99)

## 2018-10-29 LAB — PHOSPHORUS: Phosphorus: 4.9 mg/dL — ABNORMAL HIGH (ref 2.5–4.6)

## 2018-10-29 LAB — MAGNESIUM: Magnesium: 2.6 mg/dL — ABNORMAL HIGH (ref 1.7–2.4)

## 2018-10-29 MED ORDER — POTASSIUM CHLORIDE CRYS ER 20 MEQ PO TBCR
20.0000 meq | EXTENDED_RELEASE_TABLET | Freq: Once | ORAL | Status: AC
  Administered 2018-10-29: 20 meq via ORAL
  Filled 2018-10-29: qty 1

## 2018-10-29 MED ORDER — METOLAZONE 5 MG PO TABS
5.0000 mg | ORAL_TABLET | Freq: Once | ORAL | Status: AC
  Administered 2018-10-29: 5 mg via ORAL
  Filled 2018-10-29: qty 1

## 2018-10-29 NOTE — Progress Notes (Signed)
PMT consult received and chart reviewed. Discussed with Dr. Elsworth Soho. Brief initial [place] with patient completed. Continue FULL code/FULL scope treatment. Patient reports documented living will/HCPOA but not on hospital file. Patient eager to work with physical therapy when medically stable and willing to discharge to SNF for rehab if necessary.   Full palliative note to follow.  NO CHARGE  Ihor Dow, FNP-C Palliative Medicine Team  Phone: [phone] Fax: [phone]

## 2018-10-29 NOTE — Significant Event (Cosign Needed)
Spoke with Dr. Ok Anis office concerning tooth extraction. He will call back in future. Richardson Landry Phi Avans ACNP Maryanna Shape PCCM Pager [phone] till 1 pm If no answer page [phone] 10/29/2018, 10:34 AM

## 2018-10-29 NOTE — Progress Notes (Signed)
[facility]    NEPHROLOGY PROGRESS NOTE  SUBJECTIVE:  She had 5.5 liters urine output on 6/22.  Patient has been on a lasix gtt at 10 mg/hr and received metolazone 5 mg once on 6/22.  TTE with EF 60-65%, restricted filling; right vent systolic pressure 85 mm Hg.  She has been on levo.  Review of systems:  Reports shortness of breath is better. Denies chest pain Denies n/v  OBJECTIVE:  Vitals:   10/29/18 0600 10/29/18 0615  BP: (!) 113/54 121/63  Pulse: (!) 59 68  Resp: 16 19  Temp:    SpO2: 100% 98%    Intake/Output Summary (Last 24 hours) at 10/29/2018 0831 Last data filed at 10/29/2018 0617 Gross per 24 hour  Intake 1703.36 ml  Output 5250 ml  Net -3546.64 ml      General:  Elderly female in bed, anxious HEENT: MMM [place] AT anicteric sclera Neck: noted JVD CV:  Heart RRR  Lungs: clear but reduced  Abd:  abd SNT/obese habitus/distended GU:  Foley in place Extremities: +3 bilateral lower extremity edema.  MEDICATIONS:  . amiodarone  200 mg Oral Daily  . aspirin EC  81 mg Oral Daily  . atorvastatin  20 mg Oral Daily  . Chlorhexidine Gluconate Cloth  6 each Topical Q0600  . Chlorhexidine Gluconate Cloth  6 each Topical Daily  . heparin  5,000 Units Subcutaneous Q8H  . insulin aspart  0-5 Units Subcutaneous QHS  . insulin aspart  0-9 Units Subcutaneous TID WC  . ipratropium-albuterol  3 mL Nebulization Q6H  . midodrine  10 mg Oral TID WC  . mometasone-formoterol  2 puff Inhalation BID  . mupirocin ointment  1 application Nasal BID       LABS:   CBC Latest Ref Rng & Units 10/29/2018 10/28/2018 10/27/2018  WBC 4.0 - 10.5 K/uL 5.8 8.9 8.2  Hemoglobin 12.0 - 15.0 g/dL 9.1(L) 8.9(L) 8.7(L)  Hematocrit 36.0 - 46.0 % 30.7(L) 29.9(L) 30.1(L)  Platelets 150 - 400 K/uL 208 209 196    CMP Latest Ref Rng & Units 10/28/2018 10/28/2018 10/27/2018  Glucose 70 - 99 mg/dL 145(H) 172(H) 182(H)  BUN 8 - 23 mg/dL 94(H) 97(H) 95(H)  Creatinine 0.44 - 1.00 mg/dL  3.34(H) 3.71(H) 3.65(H)  Sodium 135 - 145 mmol/L 138 138 135  Potassium 3.5 - 5.1 mmol/L 4.3 5.0 5.0  Chloride 98 - 111 mmol/L 99 100 98  CO2 22 - 32 mmol/L _0 Calcium 8.9 - 10.3 mg/dL 8.3(L) 8.4(L) 8.2(L)  Total Protein 6.5 - 8.1 g/dL - - -  Total Bilirubin 0.3 - 1.2 mg/dL - - -  Alkaline Phos 38 - 126 U/L - - -  AST 15 - 41 U/L - - -  ALT 0 - 44 U/L - - -    Lab Results  Component Value Date   CALCIUM 8.3 (L) 10/28/2018   PHOS 5.8 (H) 10/28/2018    No results found for: COLORURINE, APPEARANCEUR, LABSPEC, PHURINE, GLUCOSEU, HGBUR, BILIRUBINUR, KETONESUR, PROTEINUR, UROBILINOGEN, NITRITE, LEUKOCYTESUR    Component Value Date/Time   PHART 7.380 10/27/2018 2118   PCO2ART 47.5 10/27/2018 2118   PO2ART 79.2 (L) 10/27/2018 2118   HCO3 27.4 10/27/2018 2118   O2SAT 96.1 10/27/2018 2118    No results found for: IRON, TIBC, FERRITIN, IRONPCTSAT     ASSESSMENT/PLAN:    The patient is a 68 y.o. year old female patient with a past medical history significant for type 2 diabetes for more  than 10 years, hyperlipidemia, hypertension, COPD, and breast cancer who presented as a transfer from [place] due to concerns for worsening renal function.  She was noted to have moderate to severe pulmonary hypertension with diastolic dysfunction.  Per the hospitalist at [facility], they were having difficulty diuresing her with worsening renal function with increasing doses of diuretics without improvement in urine output.  The patient complains of shortness of breath that has been ongoing for several weeks.  She reports she has had a significant amount of fluid that she has been having difficulty getting rid of.  The chart documents a 50 pound weight gain.  Renal consultation has been called for acute kidney injury, and chronic kidney disease, and difficulty with diuresis.  1.  Acute kidney injury.  She appears to have significant right-sided congestive heart failure.  An echocardiogram revealed  significant pulmonary hypertension.  - Improving with diuresis, possible cardiorenal  - Continue lasix gtt  - No acute indication for dialysis  - repeat metolazone   2.  Chronic kidney disease stage III.  Her baseline serum creatinine runs around 1.27 from 06/01/2017.  2/2 DM and HTN  3.  Hyperkalemia.  resolved with diuresis  4.  Pulmonary hypertension.  Optimizing volume status with diuretics as above  5.  Diabetes mellitus. Per primary team

## 2018-10-29 NOTE — Progress Notes (Signed)
Pt not on BiPAP at this time.

## 2018-10-29 NOTE — Evaluation (Signed)
Physical Therapy Evaluation Patient Details Name: Tammy Mathews MRN: [id number] DOB: [birth year] Today's Date: 10/29/2018   History of Present Illness  Tammy Mathews is a 68 y.o. female with medical history significant of breast cancer in remission, morbid obesity, hypertension, hyperlipidemia, type 2 diabetes, COPD and chronic hypoxic respiratory failure on 3 L home oxygen presented to [facility] with complains of left-sided chest pain, progressive shortness of breath, orthopnea, 50 pound weight gain over the last several months, and bilateral lower extremity edema.  Clinical Impression  Pt very eager to participate stating she can't go back home unless she can walk on her own. Despite patient body habitus she did extremely well for first time up in 2 weeks. Pt able to transfer to chair with minAx2 for safety. Pt c/o "I feel so weak." Recommend ST-SNF upon d/c to achieve safe mod I level of function as her family that she lives with will not help her other than with meal prep. Acute PT to cont to follow.     Follow Up Recommendations SNF;Supervision/Assistance - 24 hour    Equipment Recommendations  None recommended by PT    Recommendations for Other Services       Precautions / Restrictions Precautions Precautions: Fall Precaution Comments: morbidly obese Restrictions Weight Bearing Restrictions: No      Mobility  Bed Mobility Overal bed mobility: Needs Assistance Bed Mobility: Supine to Sit     Supine to sit: Mod assist     General bed mobility comments: pt initiated movement of LEs over to EOB, pulled with L UE on HR , due to body habitus has to pull on PT with R UE.   Transfers Overall transfer level: Needs assistance Equipment used: Rolling walker (2 wheeled) Transfers: Sit to/from [facility] Sit to Stand: Min assist;Mod assist;+2 physical assistance;+2 safety/equipment Stand pivot transfers: Min assist;+2 physical assistance;+2  safety/equipment       General transfer comment: 2nd person there for safety due to obesity, pt however able to power up with minAx2 and then complete std pvt with short shuffled steps and use of RW, minAf or walker management due to small space, +SOB, SPO2 at 88% on 3Lo2 via [place] but quickly came up to 90s  Ambulation/Gait             General Gait Details: limited to stand pvt to chair today, per patient this is her first time up on her feet in 2 weeks  Stairs            Wheelchair Mobility    Modified Rankin (Stroke Patients Only)       Balance Overall balance assessment: Needs assistance Sitting-balance support: Feet supported;Bilateral upper extremity supported Sitting balance-Leahy Scale: Fair     Standing balance support: Bilateral upper extremity supported Standing balance-Leahy Scale: Fair Standing balance comment: dependent on RW                             Pertinent Vitals/Pain Pain Assessment: Faces Faces Pain Scale: Hurts a little bit Pain Location: bilat knees due to arthritis Pain Descriptors / Indicators: (arthritic pain) Pain Intervention(s): Monitored during session    Home Living Family/patient expects to be discharged to:: Skilled nursing facility                 Additional Comments: pt lives with brother and sister-in-law in 1 story home with 2 STE    Prior Function Level of Independence: Independent with  assistive device(s)         Comments: pt used RW and sponge bathed, pt would make herself a sandwhich but family typically cooked     Hand Dominance   Dominant Hand: Right    Extremity/Trunk Assessment   Upper Extremity Assessment Upper Extremity Assessment: Generalized weakness    Lower Extremity Assessment Lower Extremity Assessment: Generalized weakness    Cervical / Trunk Assessment Cervical / Trunk Assessment: Other exceptions Cervical / Trunk Exceptions: morbidly obese  Communication    Communication: No difficulties  Cognition Arousal/Alertness: Awake/alert Behavior During Therapy: WFL for tasks assessed/performed Overall Cognitive Status: Within Functional Limits for tasks assessed                                        General Comments General comments (skin integrity, edema, etc.): pt continues to have large abdominal and bilat LE edema    Exercises     Assessment/Plan    PT Assessment Patient needs continued PT services  PT Problem List Decreased strength;Decreased range of motion;Decreased activity tolerance;Decreased balance;Decreased mobility;Decreased knowledge of use of DME;Obesity;Pain       PT Treatment Interventions DME instruction;Gait training;Stair training;Functional mobility training;Therapeutic activities;Therapeutic exercise;Balance training;Neuromuscular re-education;Cognitive remediation;Patient/family education    PT Goals (Current goals can be found in the Care Plan section)  Acute Rehab PT Goals Patient Stated Goal: get back to walking PT Goal Formulation: With patient Time For Goal Achievement: 11/12/18 Potential to Achieve Goals: Good    Frequency Min 3X/week   Barriers to discharge Decreased caregiver support despite family being there, they do not physically assist her, she must be mod I for all mobility except for meal prep    Co-evaluation               AM-PAC PT "6 Clicks" Mobility  Outcome Measure Help needed turning from your back to your side while in a flat bed without using bedrails?: A Lot Help needed moving from lying on your back to sitting on the side of a flat bed without using bedrails?: A Lot Help needed moving to and from a bed to a chair (including a wheelchair)?: A Lot Help needed standing up from a chair using your arms (e.g., wheelchair or bedside chair)?: A Lot Help needed to walk in hospital room?: A Lot Help needed climbing 3-5 steps with a railing? : Total 6 Click Score: 11     End of Session Equipment Utilized During Treatment: Gait belt Activity Tolerance: Patient tolerated treatment well Patient left: in chair;with call bell/phone within reach;with nursing/sitter in room Nurse Communication: Mobility status(encouraged pt to only sit up for an hour due to first time u) PT Visit Diagnosis: Unsteadiness on feet (R26.81);Difficulty in walking, not elsewhere classified (R26.2)    Time: 8325-4982 PT Time Calculation (min) (ACUTE ONLY): 23 min   Charges:   PT Evaluation $PT Eval Moderate Complexity: 1 Mod PT Treatments $Therapeutic Activity: 8-22 mins        Kittie Plater, PT, DPT Acute Rehabilitation Services Pager #: [phone] Office #: [phone]   Berline Lopes 10/29/2018, 1:22 PM

## 2018-10-29 NOTE — Progress Notes (Addendum)
Name: Tammy Mathews MRN: [id number] DOB: April 10, 1951 Date of service : 10/28/18 CC: SOB   LOS: 2  [facility] Critical Care Note   History of Present Illness: [name], transferred from another hospital to [facility] due to concerns for worsening renal function and shortness of breath.  HX includes breast cancer, morbid obesity, hypertension, hyperlipidemia, diabetes mellitus, chronic kidney disease and COPD on 3 L of oxygen.  Patient was reported to have left-sided chest pain, shortness of breath, dyspnea on exertion, orthopnea, leg edema and 50 pound weight gain in the last month.   Previous Echocardiogram reveals moderate to severe pulmonary hypertension with likely diastolic dysfunction.   She was admitted to cardiology floor 6/21 but has gotten progressively worsening shortness of breath, needing BIPAP . Lasix drip initiated by nephrology and thus far very poor urine output.   Lines / Drains: Right subclavian port   Cultures: COVID neg   Antibiotics: NONE   Significant tests:  Echo 6/22 >> normal LV systolic function, decreased RV SF, RVSP 83 with D-shaped septum  events : 6/22 lasix + levo gtt , off bipap   SUBJ - Diuresed 4.5 L overnight on Lasix drip Remains on Levophed drip  Vital Signs: Temp:  [97.9 F (36.6 C)-99 F (37.2 C)] 98.7 F (37.1 C) (06/23 0800) Pulse Rate:  [58-80] 67 (06/23 0800) Resp:  [14-28] 16 (06/23 0800) BP: (83-147)/(42-122) 124/60 (06/23 0800) SpO2:  [90 %-100 %] 95 % (06/23 0800) Weight:  [137.9 kg] 137.9 kg (06/23 0500) I/O last 3 completed shifts: In: 2057.4 [P.O.:600; I.V.:1268.7; IV Piggyback:188.6] Out: 6300 [Urine:6300]  Physical Examination: General:  obese , anxious affect, no distress Neuro: Alert, interactive moves all extremities  CV: tachycardic , no mr/g PULM: Decreased breath sounds bilateral, no ronchi or wheezing GI: Obese , bs+ , soft nt nd Extremities: scd's in place , swelling and  venous stasis changes noted in LE .    Chest x-ray 6/21 personally reviewed which shows bilateral atelectasis and small effusions, elevated right hemidiaphragm     Assessment and Plan: Principal Problem:   Acute on chronic respiratory failure with hypoxia and hypercapnia (HCC) Active Problems:   Volume overload   AKI (acute kidney injury) ([place])   CKD (chronic kidney disease)   Chronic diastolic CHF (congestive heart failure) (HCC)   COPD (chronic obstructive pulmonary disease) (HCC)   Acute respiratory distress   Acute-on-chronic renal failure (HCC)    Acute on chronic diastolic CHF , hx of severe pulm HTN , right sided heart failure, cor pulmonale  Cardio-renal syndrome, Ct Levophed for MAP > 65-to enable diuresis Amiodarone and aspirin , suspect she has AFIB  Acute on chronic resp failure with hypoxemia and hypercarbia secondary to  right sided heart failure, no signs on infection. Sleep study in the past did not show OSA Wonder if elevated right hemidiaphragm plays a role  Plan-continue Lasix drip BiPAP to be used as needed and nightly  Acute on ckd 3 -baseline Creatinine around 1.3 from 05/2017 ct Lasix drip @ 10/h , titrate to urine output Monitor bmet, creatinine currently decreasing  DM , SSI, goal fs < 180  BLE edema with blisters - ACE wrap Wound care consult  Acute toxic encelophathy secondary to resp distress -resolved ICU status  The patient is critically ill with multiple organ systems failure and requires high complexity decision making for assessment and support, frequent evaluation and titration of therapies, application of advanced monitoring technologies and extensive interpretation of multiple  databases. Critical Care Time devoted to patient care services described in this note independent of APP/resident  time is 31 minutes.    Kara Mead MD. Shade Flood. [facility] Pager [phone] If no response call [phone]    10/29/2018,  8:55 AM

## 2018-10-29 NOTE — Consult Note (Signed)
Consultation Note Date: 10/29/18  Patient Name: Tammy Mathews  DOB: Feb 09, 1951  MRN: [id number]  Age / Sex: 68 y.o., female  PCP: Greig Right, MD Referring Physician: Roxanne Mins, MD  Reason for Consultation: Establishing goals of care  HPI/Patient Profile: 68 y.o. female  with past medical history of obesity, pulmonary hypertension, COPD on 3L oxygen, HTN, HLD, DM type 2, breast cancer in remission admitted on 10/27/2018 from [facility] due to worsening renal function and shortness of breath. Previous echo revealed moderate to severe pulmonary hypertension with diastolic dysfunction. Admitted to floor but requiring transfer to ICU on 6/21 due to worsening dyspnea. BiPAP and lasix gtt initiated. Nephrology following. Acute on chronic diastolic CHF, severe pulmonary hypertension, right sided heart failure, cor pulmonale and likely cardio-renal syndrome. Clinical improvement and diuresing well. No indication for dialysis. Palliative medicine consultation for goals of care.   Clinical Assessment and Goals of Care:  I have reviewed medical records, discussed with Dr. Elsworth Soho, and met with patient at bedside to discuss diagnosis, [place], EOL wishes, disposition and options.  Introduced Palliative Medicine as specialized medical care for people living with serious illness. It focuses on providing relief from the symptoms and stress of a serious illness. The goal is to improve quality of life for both the patient and the family.  We discussed a brief life review of the patient. Lives in [place] with her brother Linna Hoff) and sister-in-law. Worked at [employer] until it was unfortunately closed. Reports wearing oxygen for many years. Prior to admission, fairly independent but with ~50 lb weight gain in the last contributing in decline in functional status.   Discussed course of hospitalization including diagnoses and  interventions. Discussed irreversible nature of her pulmonary hypertension and COPD contributing to worsening heart and kidney conditions.   I attempted to elicit values and goals of care important to the patient. Advanced directives, concepts specific to code status, artifical feeding and hydration were discussed. Patient reports that she has a documented living will with her sister [name], who is an Therapist, sports) as Economist. (documentation not on file). Discussed code status. She shares that at this point, she wishes for continued FULL code/FULL scope treatment including intubation/mechanical ventilation and resuscitation if necessary. She is willing to pursue dialysis if necessary in the future. She shares that if the doctors declare her "brain dead" to let her go. Encouraged ongoing discussions with her family as conditions progress.   Today, patient is most concerned about having teeth extraction at some point in the near future. Her teeth are loose and it is difficult for her to eat any foods unless pureed. She requests pureed diet (on dysphagia diet). I share that this will likely need to be arranged outpatient and once over her acute illness. Per chart review, PCCM NP spoke with Dr. Ok Anis office regarding tooth extraction.   Patient is motivated to work with physical therapy. I explained that when medically stable, therapy will be consulted and attempt getting her out of bed. She acknowledges that she cannot  return home with brother unless she is able to walk. She is willing to pursue SNF placement for rehab if necessary. Patient is hopeful to eventually return home with her brother and SIL.   Questions and concerns were addressed. Emotional/spiritual support provided.   SUMMARY OF RECOMMENDATIONS    FULL code/FULL scope treatment per patient wishes. Patient willing to pursue dialysis if necessary in the future.  She reports documented living will with sister [name]) as [place]. Documentation not on  file. Encouraged ongoing [place] discussions with her sister as conditions progress.  Patient worried about teeth extraction. PCCM NP aware and has called Dr. Ok Anis office.   Patient eager to work with physical therapy when medically stable and willing to discharge to SNF for rehab. She cannot return home with brother unless she is able to walk.  May benefit from outpatient palliative referral.   Code Status/Advance Care Planning:  Full code  Symptom Management:   Tylenol 662m PO q6h prn mild pain/arthritic pain  Palliative Prophylaxis:   Aspiration, Delirium Protocol, Frequent Pain Assessment, Oral Care and Turn Reposition  Additional Recommendations (Limitations, Scope, Preferences):  Full Scope Treatment  Psycho-social/Spiritual:   Desire for further Chaplaincy support:yes  Additional Recommendations: Caregiving  Support/Resources and Compassionate Wean Education  Prognosis:   Unable to determine  Discharge Planning: To Be Determined      Primary Diagnoses: Present on Admission: . Volume overload . Acute on chronic respiratory failure with hypoxia and hypercapnia (HCC) . Acute-on-chronic renal failure ([place]   I have reviewed the medical record, interviewed the patient and family, and examined the patient. The following aspects are pertinent.  Past Medical History:  Diagnosis Date  . Breast cancer ([place]   . COPD (chronic obstructive pulmonary disease) ([place]   . Diabetes mellitus type 2 in obese ([place]   . HLD (hyperlipidemia)   . HTN (hypertension)   . Morbid obesity ([place]    Social History   Socioeconomic History  . Marital status: Single    Spouse name: Not on file  . Number of children: Not on file  . Years of education: Not on file  . Highest education level: Not on file  Occupational History  . Not on file  Social Needs  . Financial resource strain: Not on file  . Food insecurity    Worry: Not on file    Inability: Not on file  .  Transportation needs    Medical: Not on file    Non-medical: Not on file  Tobacco Use  . Smoking status: Never Smoker  . Smokeless tobacco: Never Used  Substance and Sexual Activity  . Alcohol use: Never    Frequency: Never  . Drug use: Never  . Sexual activity: Not on file  Lifestyle  . Physical activity    Days per week: Not on file    Minutes per session: Not on file  . Stress: Not on file  Relationships  . Social [place] phone: Not on file    Gets together: Not on file    Attends religious service: Not on file    Active member of club or organization: Not on file    Attends meetings of clubs or organizations: Not on file    Relationship status: Not on file  Other Topics Concern  . Not on file  Social History Narrative  . Not on file   Family History  Problem Relation Age of Onset  . Heart disease Mother   .  Colon cancer Mother   . Breast cancer Mother   . Emphysema Father        smoked  . Emphysema Brother        smoked   Scheduled Meds: . amiodarone  200 mg Oral Daily  . aspirin EC  81 mg Oral Daily  . atorvastatin  20 mg Oral Daily  . Chlorhexidine Gluconate Cloth  6 each Topical Q0600  . Chlorhexidine Gluconate Cloth  6 each Topical Daily  . heparin  5,000 Units Subcutaneous Q8H  . insulin aspart  0-5 Units Subcutaneous QHS  . insulin aspart  0-9 Units Subcutaneous TID WC  . ipratropium-albuterol  3 mL Nebulization Q6H  . midodrine  10 mg Oral TID WC  . mometasone-formoterol  2 puff Inhalation BID  . mupirocin ointment  1 application Nasal BID   Continuous Infusions: . furosemide (LASIX) infusion 10 mg/hr (10/29/18 0800)  . norepinephrine (LEVOPHED) Adult infusion 6 mcg/min (10/29/18 0800)   PRN Meds:.acetaminophen **OR** acetaminophen Medications Prior to Admission:  Prior to Admission medications   Medication Sig Start Date End Date Taking? Authorizing Provider  acetaminophen (TYLENOL) 500 MG tablet Take 1,000 mg by mouth every 6  (six) hours as needed for mild pain.   Yes [provider]  albuterol (PROVENTIL HFA;VENTOLIN HFA) 108 (90 Base) MCG/ACT inhaler Inhale 2 puffs into the lungs every 4 (four) hours as needed for wheezing or shortness of breath.    Yes [provider]  amiodarone (PACERONE) 200 MG tablet Take 200 mg by mouth daily.   Yes [provider]  anastrozole (ARIMIDEX) 1 MG tablet Take 1 mg by mouth daily.   Yes [provider]  aspirin EC 81 MG tablet Take 81 mg by mouth daily.   Yes [provider]  atorvastatin (LIPITOR) 20 MG tablet Take 20 mg by mouth daily.   Yes [provider]  bisoprolol (ZEBETA) 5 MG tablet TAKE 1 TABLET BY MOUTH ONCE DAILY. Patient taking differently: Take 5 mg by mouth daily.  09/04/18  Yes Tanda Rockers, MD  budesonide (PULMICORT) 0.5 MG/2ML nebulizer solution Inhale 2 mLs into the lungs every 6 (six) hours as needed for shortness of breath. 06/23/18  Yes [provider]  budesonide-formoterol (SYMBICORT) 80-4.5 MCG/ACT inhaler Take 2 puffs first thing in am and then another 2 puffs about 12 hours later. 06/01/17  Yes Tanda Rockers, MD  insulin glargine (LANTUS) 100 UNIT/ML injection Inject 50 Units into the skin at bedtime.   Yes [provider]  insulin lispro (HUMALOG) 100 UNIT/ML injection Inject 10 Units into the skin every evening.    Yes [provider]  ipratropium-albuterol (DUONEB) 0.5-2.5 (3) MG/3ML SOLN Take 3 mLs by nebulization every 4 (four) hours as needed.   Yes [provider]  loratadine (CLARITIN) 10 MG tablet Take 10 mg by mouth daily as needed for allergies.   Yes [provider]  meclizine (ANTIVERT) 25 MG tablet Take 25 mg by mouth every 6 (six) hours as needed for dizziness.    Yes [provider]  olmesartan (BENICAR) 40 MG tablet Take 40 mg by mouth daily. 09/04/18  Yes [provider]  OXYGEN 3 L continuous. [facility]   Yes [provider]  torsemide (DEMADEX) 20 MG tablet Take 40 mg by mouth daily. 09/04/18  Yes [provider]  [place] test strip  02/06/18   [provider]   Allergies  Allergen Reactions  . Codeine Nausea  And Vomiting and Other (See Comments)    Unknown   . Morphine And Related Nausea And Vomiting   Review of Systems  Constitutional: Positive for activity change.  Respiratory: Positive for shortness of breath.   Neurological: Positive for weakness.   Physical Exam Vitals signs and nursing note reviewed.  Constitutional:      General: She is awake.  HENT:     Head: Normocephalic and atraumatic.  Cardiovascular:     Rate and Rhythm: Normal rate.  Pulmonary:     Effort: No tachypnea, accessory muscle usage or respiratory distress.     Comments: Intermittent mild dyspnea at rest. 3L [place]. Skin:    General: Skin is warm and dry.  Neurological:     Mental Status: She is alert and oriented to person, place, and time.  Psychiatric:        Mood and Affect: Mood normal.        Speech: Speech normal.        Behavior: Behavior normal.        Cognition and Memory: Cognition normal.    Vital Signs: BP 124/60 (BP Location: Right Wrist)   Pulse 67   Temp 98.7 F (37.1 C) (Oral)   Resp 16   Ht 5' 1" (1.549 m)   Wt (!) 137.9 kg   SpO2 96%   BMI 57.44 kg/m  Pain Scale: 0-10   Pain Score: 0-No pain   SpO2: SpO2: 96 % O2 Device:SpO2: 96 % O2 Flow Rate: .O2 Flow Rate (L/min): 3 L/min  IO: Intake/output summary:   Intake/Output Summary (Last 24 hours) at 10/29/2018 1104 Last data filed at 10/29/2018 0950 Gross per 24 hour  Intake 1579.6 ml  Output 5950 ml  Net -4370.4 ml    LBM: Last BM Date: 10/29/18 Baseline Weight: Weight: (!) 143.4 kg Most recent weight: Weight: (!) 137.9 kg     Palliative Assessment/Data: PPS 50%   Flowsheet Rows     Most Recent Value  Intake Tab  Referral Department  Critical care  Unit at Time of Referral  ICU  Palliative  Care Primary Diagnosis  Pulmonary  Date Notified  10/28/18  Palliative Care Type  New Palliative care  Reason for referral  Clarify Goals of Care  Date first seen by Palliative Care  10/29/18  # of days Palliative referral response time  1 Day(s)  Clinical Assessment  Palliative Performance Scale Score  50%  Psychosocial & Spiritual Assessment  Palliative Care Outcomes  Patient/Family meeting held?  Yes  Who was at the meeting?  patient  Palliative Care Outcomes  Clarified goals of care, Provided psychosocial or spiritual support, ACP counseling assistance, Linked to palliative care logitudinal support      Time In: 1000 Time Out: 1100 Time Total: 60 Greater than 50%  of this time was spent counseling and coordinating care related to the above assessment and plan.  Signed by:  Ihor Dow, FNP-C Palliative Medicine Team  Phone: [phone] Fax: [phone]   Please contact Palliative Medicine Team phone at [phone] for questions and concerns.  For individual provider: See Shea Evans

## 2018-10-30 ENCOUNTER — Inpatient Hospital Stay (HOSPITAL_COMMUNITY): Payer: Medicare Other

## 2018-10-30 DIAGNOSIS — E8770 Fluid overload, unspecified: Secondary | ICD-10-CM

## 2018-10-30 LAB — IRON AND TIBC
Iron: 26 ug/dL — ABNORMAL LOW (ref 28–170)
Saturation Ratios: 8 % — ABNORMAL LOW (ref 10.4–31.8)
TIBC: 344 ug/dL (ref 250–450)
UIBC: 318 ug/dL

## 2018-10-30 LAB — BASIC METABOLIC PANEL
Anion gap: 11 (ref 5–15)
BUN: 86 mg/dL — ABNORMAL HIGH (ref 8–23)
CO2: 31 mmol/L (ref 22–32)
Calcium: 8.8 mg/dL — ABNORMAL LOW (ref 8.9–10.3)
Chloride: 97 mmol/L — ABNORMAL LOW (ref 98–111)
Creatinine, Ser: 2.56 mg/dL — ABNORMAL HIGH (ref 0.44–1.00)
GFR calc Af Amer: 21 mL/min — ABNORMAL LOW (ref >60)
GFR calc non Af Amer: 18 mL/min — ABNORMAL LOW (ref >60)
Glucose, Bld: 164 mg/dL — ABNORMAL HIGH (ref 70–99)
Potassium: 4 mmol/L (ref 3.5–5.1)
Sodium: 139 mmol/L (ref 135–145)

## 2018-10-30 LAB — POTASSIUM: Potassium: 3.9 mmol/L (ref 3.5–5.1)

## 2018-10-30 LAB — GLUCOSE, CAPILLARY
Glucose-Capillary: 170 mg/dL — ABNORMAL HIGH (ref 70–99)
Glucose-Capillary: 164 mg/dL — ABNORMAL HIGH (ref 70–99)
Glucose-Capillary: 135 mg/dL — ABNORMAL HIGH (ref 70–99)
Glucose-Capillary: 145 mg/dL — ABNORMAL HIGH (ref 70–99)

## 2018-10-30 LAB — FERRITIN: Ferritin: 25 ng/mL (ref 11–307)

## 2018-10-30 LAB — MAGNESIUM: Magnesium: 2.7 mg/dL — ABNORMAL HIGH (ref 1.7–2.4)

## 2018-10-30 LAB — PHOSPHORUS: Phosphorus: 4.7 mg/dL — ABNORMAL HIGH (ref 2.5–4.6)

## 2018-10-30 MED ORDER — POTASSIUM CHLORIDE CRYS ER 20 MEQ PO TBCR
20.0000 meq | EXTENDED_RELEASE_TABLET | Freq: Once | ORAL | Status: AC
  Administered 2018-10-30: 20 meq via ORAL
  Filled 2018-10-30: qty 1

## 2018-10-30 MED ORDER — IPRATROPIUM-ALBUTEROL 0.5-2.5 (3) MG/3ML IN SOLN
3.0000 mL | Freq: Three times a day (TID) | RESPIRATORY_TRACT | Status: DC
  Administered 2018-10-30 – 2018-11-02 (×8): 3 mL via RESPIRATORY_TRACT
  Filled 2018-10-30 (×8): qty 3

## 2018-10-30 NOTE — Progress Notes (Signed)
Orthopedic Tech Progress Note Patient Details:  Tammy Mathews Apr 02, 1951 [id number]  Ortho Devices Type of Ortho Device: Haematologist Ortho Device/Splint Interventions: Application, Ordered   Post Interventions Patient Tolerated: Well Instructions Provided: Care of device, Adjustment of device, Poper ambulation with device   Melony Overly T 10/30/2018, 1:31 PM

## 2018-10-30 NOTE — Consult Note (Signed)
DENTAL CONSULTATION  Date of Consultation:  10/30/2018 Patient Name:   Tammy Mathews Date of Birth:   06-Aug-1950 Medical Record Number: [id number]  COVID 19 SCREENING: The patient does not symptoms concerning for COVID-19 infection (Including fever, chills, cough, or new SHORTNESS OF BREATH).    VITALS: BP (!) 99/48   Pulse 76   Temp 98.1 F (36.7 C) (Oral)   Resp 16   Ht _0  (1.549 m)   Wt 132.8 kg   SpO2 96%   BMI 55.32 kg/m   CHIEF COMPLAINT: The patient was referred by Dr. Elsworth Soho for a dental consultation.  HPI: Tammy Mathews is a 68 year old female recently transferred from [facility] for history of acute on chronic respiratory failure and shortness of breath.  A medically necessary dental consultation was requested for evaluation of poor dentition and loose teeth.  The patient currently denies acute toothaches, swellings, or abscesses.  Patient was last seen by dentist 1 to 2 years ago.  Patient had a tooth extracted at that time.  There were no complications from the dental extraction.  Patient cannot remember the name of the dentist but it was in either [place] or [place], [place].  The patient denies having partial dentures.  Patient does have dental phobia.  PROBLEM LIST: Patient Active Problem List   Diagnosis Date Noted  . Palliative care by specialist   . Goals of care, counseling/discussion   . Acute on chronic respiratory failure with hypoxia and hypercapnia ([place]) 10/28/2018  . Acute-on-chronic renal failure ([place]) 10/28/2018  . Volume overload 10/27/2018  . AKI (acute kidney injury) ([place]) 10/27/2018  . CKD (chronic kidney disease) 10/27/2018  . Chronic diastolic CHF (congestive heart failure) ([place]) 10/27/2018  . COPD (chronic obstructive pulmonary disease) ([place]) 10/27/2018  . Acute respiratory distress   . Apnea 02/08/2018  . Arthritis 02/08/2018  . Constipation 02/08/2018  . Ductal carcinoma in situ (DCIS) of right breast 02/08/2018  .  Sore on leg 02/08/2018  . Requires supplemental oxygen 02/08/2018  . Diastolic dysfunction 36/62/9476  . Tricuspid regurgitation 05/30/2017  . Dyslipidemia 04/20/2017  . Cor pulmonale (chronic) ([place]) 04/06/2017  . Asthma 04/06/2017  . Morbid obesity due to excess calories ([place]) 04/06/2017  . Chronic respiratory failure with hypoxia and hypercapnia ([place]) 04/06/2017  . Shortness of breath 04/05/2017  . HTN (hypertension) 04/05/2017  . Persistent atrial fibrillation 12/06/2016  . Pulmonary hypertension ([place]) 12/06/2016  . Post-op pain 11/29/2015  . Adult BMI 45.0-49.9 kg/sq m ([place]) 10/29/2015  . Carcinoma of left breast metastatic to axillary lymph node ([place]) 10/29/2015  . Type 2 diabetes mellitus with complication ([place]) 54/65/0354  . H/O therapeutic radiation 10/29/2015  . OSA (obstructive sleep apnea) 10/29/2015  . Pulmonary emphysema ([place]) 10/29/2015    PMH: Past Medical History:  Diagnosis Date  . Breast cancer ([place])   . COPD (chronic obstructive pulmonary disease) ([place])   . Diabetes mellitus type 2 in obese ([place])   . HLD (hyperlipidemia)   . HTN (hypertension)   . Morbid obesity (HCC)     PSH: Past Surgical History:  Procedure Laterality Date  . BREAST SURGERY    . HERNIA REPAIR      ALLERGIES: Allergies  Allergen Reactions  . Codeine Nausea And Vomiting and Other (See Comments)    Unknown   . Morphine And Related Nausea And Vomiting    MEDICATIONS: Current Facility-Administered Medications  Medication Dose Route Frequency Provider Last Rate Last Dose  . acetaminophen (TYLENOL) tablet 650 mg  650 mg Oral Q6H PRN Shela Leff, MD   650 mg at 10/29/18 2338   Or  . acetaminophen (TYLENOL) suppository 650 mg  650 mg Rectal Q6H PRN Shela Leff, MD      . amiodarone (PACERONE) tablet 200 mg  200 mg Oral Daily Roxanne Mins, MD   200 mg at 10/30/18 3354  . aspirin EC tablet 81 mg  81 mg Oral Daily Roxanne Mins, MD   81 mg at 10/30/18 5625  .  atorvastatin (LIPITOR) tablet 20 mg  20 mg Oral Daily Roxanne Mins, MD   20 mg at 10/30/18 6389  . Chlorhexidine Gluconate Cloth 2 % PADS 6 each  6 each Topical Q0600 Roxanne Mins, MD   6 each at 10/28/18 [phone]  . Chlorhexidine Gluconate Cloth 2 % PADS 6 each  6 each Topical Daily Roxanne Mins, MD   6 each at 10/29/18 [phone]  . furosemide (LASIX) 250 mg in dextrose 5 % 250 mL (1 mg/mL) infusion  10 mg/hr Intravenous Continuous Blount, Xenia T, NP 10 mL/hr at 10/30/18 0900 10 mg/hr at 10/30/18 0900  . heparin injection 5,000 Units  5,000 Units Subcutaneous Q8H Shela Leff, MD   5,000 Units at 10/30/18 0524  . insulin aspart (novoLOG) injection 0-5 Units  0-5 Units Subcutaneous QHS Shela Leff, MD      . insulin aspart (novoLOG) injection 0-9 Units  0-9 Units Subcutaneous TID WC Shela Leff, MD   2 Units at 10/30/18 [phone]  . ipratropium-albuterol (DUONEB) 0.5-2.5 (3) MG/3ML nebulizer solution 3 mL  3 mL Nebulization Q6H Roxanne Mins, MD   3 mL at 10/30/18 0758  . midodrine (PROAMATINE) tablet 10 mg  10 mg Oral TID WC Shela Leff, MD   10 mg at 10/30/18 0904  . mometasone-formoterol (DULERA) 100-5 MCG/ACT inhaler 2 puff  2 puff Inhalation BID Roxanne Mins, MD   2 puff at 10/30/18 0759  . mupirocin ointment (BACTROBAN) 2 % 1 application  1 application Nasal BID Roxanne Mins, MD   1 application at 72/62/03 [phone]  . norepinephrine (LEVOPHED) 45m in 2563mpremix infusion  0-40 mcg/min Intravenous Titrated RoRoxanne MinsMD   Stopped at 10/30/18 [id number]  LABS: Lab Results  Component Value Date   WBC 5.8 10/29/2018   HGB 9.1 (L) 10/29/2018   HCT 30.7 (L) 10/29/2018   MCV 88.2 10/29/2018   PLT 208 10/29/2018      Component Value Date/Time   NA 139 10/30/2018 0311   K 4.0 10/30/2018 0311   CL 97 (L) 10/30/2018 0311   CO2 31 10/30/2018 0311   GLUCOSE 164 (H) 10/30/2018 0311   BUN 86 (H) 10/30/2018 0311   CREATININE 2.56 (H) 10/30/2018 0311   CALCIUM 8.8  (L) 10/30/2018 0311   GFRNONAA 18 (L) 10/30/2018 0311   GFRAA 21 (L) 10/30/2018 0311   No results found for: INR, PROTIME No results found for: PTT  SOCIAL HISTORY: Social History   Socioeconomic History  . Marital status: Single    Spouse name: Not on file  . Number of children: Not on file  . Years of education: Not on file  . Highest education level: Not on file  Occupational History  . Not on file  Social Needs  . Financial resource strain: Not on file  . Food insecurity    Worry: Not on file    Inability: Not on file  . Transportation needs    Medical:  Not on file    Non-medical: Not on file  Tobacco Use  . Smoking status: Never Smoker  . Smokeless tobacco: Never Used  Substance and Sexual Activity  . Alcohol use: Never    Frequency: Never  . Drug use: Never  . Sexual activity: Not on file  Lifestyle  . Physical activity    Days per week: Not on file    Minutes per session: Not on file  . Stress: Not on file  Relationships  . Social Herbalist on phone: Not on file    Gets together: Not on file    Attends religious service: Not on file    Active member of club or organization: Not on file    Attends meetings of clubs or organizations: Not on file    Relationship status: Not on file  . Intimate partner violence    Fear of current or ex partner: Not on file    Emotionally abused: Not on file    Physically abused: Not on file    Forced sexual activity: Not on file  Other Topics Concern  . Not on file  Social History Narrative  . Not on file    FAMILY HISTORY: Family History  Problem Relation Age of Onset  . Heart disease Mother   . Colon cancer Mother   . Breast cancer Mother   . Emphysema Father        smoked  . Emphysema Brother        smoked    REVIEW OF SYSTEMS: Reviewed with the patient as per History of present illness. Psych: Patient does have dental phobia.    DENTAL HISTORY: CHIEF COMPLAINT: The patient was referred by  Dr. Elsworth Soho for a dental consultation.  HPI: Tammy Mathews is a 68 year old female recently transferred from [facility] for history of acute on chronic respiratory failure and shortness of breath.  A medically necessary dental consultation was requested for evaluation of poor dentition and loose teeth.  The patient currently denies acute toothaches, swellings, or abscesses.  Patient was last seen by dentist 1 to 2 years ago.  Patient had a tooth extracted at that time.  There were no complications from the dental extraction.  Patient cannot remember the name of the dentist but it was in either [place] or [place], [place].  The patient denies having partial dentures.  Patient does have dental phobia.  DENTAL EXAMINATION: GENERAL:   The patient is a well-developed, well-nourished female in no acute distress.  Patient is laying in hospital bed with supplemental oxygen therapy via nasal cannula. HEAD AND NECK: There is no palpable neck lymphadenopathy.  The patient denies acute TMJ symptoms. INTRAORAL EXAM: Patient has normal saliva.  There is no evidence of oral abscess formation.  There is atrophy of the edentulous alveolar ridges. DENTITION: Patient has multiple missing teeth with the exception of tooth numbers 13, 14, 21, 22, 27, 28, and 32. PERIODONTAL: Patient has chronic, advanced periodontal disease with plaque and calculus accumulations, generalized gingival recession, and tooth mobility. DENTAL CARIES/SUBOPTIMAL RESTORATIONS: Dental caries are noted. ENDODONTIC: The patient currently denies acute pulpitis symptoms. The patient has extensive bone loss, but I do not see any evidence of periapical pathology at this time. CROWN AND BRIDGE: There are no crown or bridge restorations. PROSTHODONTIC: There are no partial dentures. OCCLUSION: The patient has a poor occlusal scheme secondary to multiple missing teeth, supra eruption and drifting of the unopposed teeth into the edentulous  areas,  and lack replacement of missing teeth with clinically acceptable dental prostheses.  RADIOGRAPHIC INTERPRETATION: An orthopantogram was taken by the department of radiology. There are multiple missing teeth with the exception of tooth numbers 13, 14, 21, 22, 27, 28, and 32. There is severe bone loss. There are dental caries noted. There is supra-eruption and drifting of the unopposed teeth into the edentulous areas. Ears atrophy of the edentulous alveolar ridges.  ASSESSMENTS: 1.  Acute on chronic respiratory failure 2.  Chronic periodontitis with severe bone loss 3.  Generalized gingival recession 4.  Accretions 5.  Generalized tooth mobility 6.  Dental caries 7.  Multiple missing teeth 8.  Supra eruption and drifting of the unopposed teeth into the edentulous areas 9.   Atrophy of edentulous alveolar ridges 10.  No history of partial dentures 11.  Poor occlusal scheme and malocclusion 12.  Dental phobia 13.  Risk for complications up to and including death due to her overall medical and respiratory compromise.   PLAN/RECOMMENDATIONS: 1. I discussed the risks, benefits, and complications of various treatment options with the patient in relationship to her medical and dental conditions, dental phobia, and her overall medical and respiratory compromise. We discussed various treatment options to include no treatment, multiple extractions with alveoloplasty, pre-prosthetic surgery as indicated, implant therapy, and replacement of missing teeth as indicated. The patient currently is adamant about not proceeding with extraction of remaining teeth in the operating room with general anesthesia during this admission.  The patient may be willing to proceed with extractions with a local oral surgeon in [place], [place] once she is more medically stable after this admission.  The patient will then follow-up with the general dentist of her choice for fabrication of upper and lower complete  dentures after adequate healing.  Patient is aware that due to her significant atrophy of the edentulous alveolar ridges, the prognosis for successful upper and lower complete denture fabrication is significantly limited.  Patient is aware of implant options but has limited economic resources at this time.  Patient is aware that Medicaid should cover the cost associated with the dental extraction procedures with an oral surgeon.  I will assist the patient in follow-up with the oral surgeon if so instructed by the patient.   2. Discussion of findings with medical team and coordination of future medical and dental care as needed.   Lenn Cal, DDS

## 2018-10-30 NOTE — Progress Notes (Signed)
[facility] Progress Note Patient Name: Marielouise Amey DOB: [birth year] MRN: [id number]   Date of Service  10/30/2018  HPI/Events of Note  RN requesting sedation. Pt with severe COPD and OSA and is exquisitely vulnerable to sedation. Pt is resting quietly in bed as of camera rounds.  eICU Interventions  No sedation for now.        Kerry Kass Alajia Schmelzer 10/30/2018, 12:50 AM

## 2018-10-30 NOTE — Progress Notes (Addendum)
Name: Tammy Mathews MRN: [id number] DOB: 1951/04/05 Date of service : 10/28/18 CC: SOB   LOS: 3  [facility] / Critical Care Note   History of Present Illness: [name], transferred from another hospital to [facility] due to concerns for worsening renal function and shortness of breath.  HX includes breast cancer, morbid obesity, hypertension, hyperlipidemia, diabetes mellitus, chronic kidney disease and COPD on 3 L of oxygen.  Patient was reported to have left-sided chest pain, shortness of breath, dyspnea on exertion, orthopnea, leg edema and 50 pound weight gain in the last month.   Previous Echocardiogram reveals moderate to severe pulmonary hypertension with likely diastolic dysfunction.   She was admitted to cardiology floor 6/21 but has gotten progressively worsening shortness of breath, needing BIPAP . Lasix drip initiated by nephrology and thus far very poor urine output.   Lines / Drains: Right subclavian port   Cultures: COVID neg   Antibiotics: NONE   Significant tests:  Echo 6/22 >> normal LV systolic function, decreased RV SF, RVSP 83 with D-shaped septum  events : 6/22 lasix + levo gtt , off bipap   SUBJ - Continues to diurese Quiring Levophed to maintain systolic blood pressure  Vital Signs: Temp:  [98 F (36.7 C)-98.8 F (37.1 C)] 98.1 F (36.7 C) (06/24 0645) Pulse Rate:  [57-81] 70 (06/24 0800) Resp:  [14-29] 17 (06/24 0800) BP: (69-132)/(36-108) 125/58 (06/24 0800) SpO2:  [91 %-99 %] 95 % (06/24 0804) Weight:  [132.8 kg] 132.8 kg (06/24 0500) I/O last 3 completed shifts: In: 1652.9 [P.O.:360; I.V.:1292.9] Out: 9465 [Urine:9465]  Physical Examination: General: Morbidly obese female currently off BiPAP HEENT: No neck Neuro: Awake follows commands CV: Sounds are distant PULM: Decreased breath sounds throughout GB:MSXJ, non-tender, bsx4 active, morbidly obese Extremities: Extensive lower extremity vascular disease  venous stasis Skin: Lower extremities with chronic venous stasis changes   Chest x-ray 6/21 personally reviewed which shows bilateral atelectasis and small effusions, elevated right hemidiaphragm     Assessment and Plan: Principal Problem:   Acute on chronic respiratory failure with hypoxia and hypercapnia (HCC) Active Problems:   Volume overload   AKI (acute kidney injury) ([place])   CKD (chronic kidney disease)   Chronic diastolic CHF (congestive heart failure) (HCC)   COPD (chronic obstructive pulmonary disease) (HCC)   Acute respiratory distress   Acute-on-chronic renal failure (HCC)   Palliative care by specialist   Goals of care, counseling/discussion    Acute on chronic diastolic CHF , hx of severe pulm HTN , right sided heart failure, cor pulmonale  Cardio-renal syndrome, Continue Levophed to maintain mean arterial pressure greater than 65 Amiodarone and aspirin  Acute on chronic resp failure with hypoxemia and hypercarbia secondary to  right sided heart failure, no signs on infection. Sleep study in the past did not show OSA Wonder if elevated right hemidiaphragm plays a role  Plan- Continue diuresis as tolerated BiPAP nocturnally and as needed  Acute on ckd 3 - Lab Results  Component Value Date   CREATININE 2.56 (H) 10/30/2018   CREATININE 3.27 (H) 10/29/2018   CREATININE 2.97 (H) 10/29/2018    Renal is following Remains on Lasix drip Requiring Levophed to maintain diuresis  DM , SSI, goal fs < 180 CBG (last 3)  Recent Labs    10/29/18 1545 10/29/18 2105 10/30/18 0649  GLUCAP 147* 187* 170*   Sliding scale insulin protocol  BLE edema with blisters - ACE wrap Wound care consult following  Acute toxic encelophathy  secondary to resp distress -resolved    ICU status  App cct 30 min   Richardson Landry Minor ACNP Maryanna Shape PCCM Pager [phone] till 1 pm If no answer page [phone] 10/30/2018, 9:16 AM   PCCM attending:  68 year old female  admitted for acute renal failure, acute on chronic kidney disease, hyperlipidemia, hypertension, diabetes, COPD on 4 L nasal cannula at baseline, progressive orthopnea, shortness of breath and bilateral lower extremity edema and approximately 50 pound weight gain diagnosed with volume overload and acute on chronic diastolic heart failure.  BP (!) 99/48   Pulse 76   Temp 98.1 F (36.7 C) (Oral)   Resp 16   Ht _0  (1.549 m)   Wt 132.8 kg   SpO2 96%   BMI 55.32 kg/m   General: Chronically ill-appearing elderly female, resting in bed, on vasopressors Neuro: Awake, following commands, moves all 4 extremities Heart: Regular rate and rhythm, S1-S2 Lungs: Clear to auscultation bilaterally, no crackles no wheeze, Extremities: Evidence of chronic venous insufficiency chronic vascular disease and venous stasis with hyperpigmentation of the lower extremities.  Labs reviewed Chest x-ray reviewed, small right-sided pleural effusion evidence of atelectasis The patient's images have been independently reviewed by me.   Repeat chest x-ray from today pending  Echo: IMPRESSIONS  1. The left ventricle has normal systolic function with an ejection fraction of 60-65%. The cavity size was normal. Left ventricular diastolic Doppler parameters are consistent with restrictive filling.  2. The right ventricle has moderately reduced systolic function. The cavity was mildly enlarged. Right ventricular systolic pressure is severely elevated with an estimated pressure of 84.9 mmHg.  3. There is left bowing of the interatrial septum, suggestive of elevated right atrial pressure.  4. Right atrial size was mildly dilated.  5. The mitral valve is grossly normal. Mild thickening of the mitral valve leaflet. There is mild mitral annular calcification present.  6. The tricuspid valve is grossly normal. Tricuspid valve regurgitation is moderate.  7. The aortic valve is tricuspid. Mild thickening of the aortic valve. No  stenosis of the aortic valve.  8. The inferior vena cava was dilated in size with <50% respiratory variability.  9. Normal LV systolic function; restrictive filling; D shaped septum c/w pulmonary hypertension; mild RAE and RVE with moderate RV dysfunction; moderate TR; severe pulmonary hypertension.  A: Acute on chronic diastolic heart failure Severe pulmonary hypertension Right sided heart failure, cor pulmonale Cardiogenic shock requiring vasopressor support Cardiorenal syndrome Acute on chronic hypoxemic hypercarbic respiratory failure Diabetes type 2  P: Continue IV diuresis Appreciate input from nephrology Agree with ongoing palliative care assessment Wean norepinephrine as tolerated to maintain mean arterial pressure greater than 65 mmHg. Monitor electrolytes with diuresis.  This patient is critically ill with multiple organ system failure; which, requires frequent high complexity decision making, assessment, support, evaluation, and titration of therapies. This was completed through the application of advanced monitoring technologies and extensive interpretation of multiple databases. During this encounter critical care time was devoted to patient care services described in this note for 36 minutes.  Garner Nash, DO [facility] 10/30/2018 11:01 AM  Personal pager: [phone] If unanswered, please page CCM On-call: [phone]

## 2018-10-30 NOTE — Progress Notes (Signed)
Pt currently not on BiPAP

## 2018-10-30 NOTE — Progress Notes (Signed)
[facility]    NEPHROLOGY PROGRESS NOTE  SUBJECTIVE:  She had 5.56 liters urine output on 6/23.  Patient has been on a lasix gtt at 10 mg/hr and received metolazone 5 mg again on 6/23.  She has continued on levo 5-7 mcg/min overnight.  Feels a little better today.  Review of systems:  Shortness of breath improving Denies cp Denies n/v   OBJECTIVE:  Vitals:   10/30/18 0545 10/30/18 0645  BP: (!) 96/39   Pulse: 61   Resp: 16   Temp:  98.1 F (36.7 C)  SpO2: 96%     Intake/Output Summary (Last 24 hours) at 10/30/2018 7342 Last data filed at 10/30/2018 0600 Gross per 24 hour  Intake 1059.34 ml  Output 5635 ml  Net -4575.66 ml      General:  Elderly female in bed in NAD HEENT: MMM [place] AT anicteric sclera Neck: JVD+  CV:  Heart RRR ; no rub Lungs: clear but reduced anteriorly; unlabored at rest Abd:  abd SNT/obese habitus/distended GU:  Foley in place Extremities: 2-3+ bilateral lower extremity edema.  MEDICATIONS:  . amiodarone  200 mg Oral Daily  . aspirin EC  81 mg Oral Daily  . atorvastatin  20 mg Oral Daily  . Chlorhexidine Gluconate Cloth  6 each Topical Q0600  . Chlorhexidine Gluconate Cloth  6 each Topical Daily  . heparin  5,000 Units Subcutaneous Q8H  . insulin aspart  0-5 Units Subcutaneous QHS  . insulin aspart  0-9 Units Subcutaneous TID WC  . ipratropium-albuterol  3 mL Nebulization Q6H  . midodrine  10 mg Oral TID WC  . mometasone-formoterol  2 puff Inhalation BID  . mupirocin ointment  1 application Nasal BID       LABS:   CBC Latest Ref Rng & Units 10/29/2018 10/28/2018 10/27/2018  WBC 4.0 - 10.5 K/uL 5.8 8.9 8.2  Hemoglobin 12.0 - 15.0 g/dL 9.1(L) 8.9(L) 8.7(L)  Hematocrit 36.0 - 46.0 % 30.7(L) 29.9(L) 30.1(L)  Platelets 150 - 400 K/uL 208 209 196    CMP Latest Ref Rng & Units 10/30/2018 10/29/2018 10/29/2018  Glucose 70 - 99 mg/dL 164(H) 165(H) 149(H)  BUN 8 - 23 mg/dL 86(H) 89(H) 89(H)  Creatinine 0.44 - 1.00 mg/dL 2.56(H)  3.27(H) 2.97(H)  Sodium 135 - 145 mmol/L 139 139 140  Potassium 3.5 - 5.1 mmol/L 4.0 3.9 3.9  Chloride 98 - 111 mmol/L 97(L) 99 98  CO2 22 - 32 mmol/L _0 Calcium 8.9 - 10.3 mg/dL 8.8(L) 8.6(L) 8.6(L)  Total Protein 6.5 - 8.1 g/dL - - -  Total Bilirubin 0.3 - 1.2 mg/dL - - -  Alkaline Phos 38 - 126 U/L - - -  AST 15 - 41 U/L - - -  ALT 0 - 44 U/L - - -    Lab Results  Component Value Date   CALCIUM 8.8 (L) 10/30/2018   PHOS 4.7 (H) 10/30/2018    No results found for: COLORURINE, APPEARANCEUR, LABSPEC, PHURINE, GLUCOSEU, HGBUR, BILIRUBINUR, KETONESUR, PROTEINUR, UROBILINOGEN, NITRITE, LEUKOCYTESUR    Component Value Date/Time   PHART 7.380 10/27/2018 2118   PCO2ART 47.5 10/27/2018 2118   PO2ART 79.2 (L) 10/27/2018 2118   HCO3 27.4 10/27/2018 2118   O2SAT 96.1 10/27/2018 2118    No results found for: IRON, TIBC, FERRITIN, IRONPCTSAT     ASSESSMENT/PLAN:    The patient is a 68 y.o. year old female patient with a past medical history significant for type 2 diabetes for  more than 10 years, hyperlipidemia, hypertension, COPD, and breast cancer who presented as a transfer from [place] due to concerns for worsening renal function.  She was noted to have moderate to severe pulmonary hypertension with diastolic dysfunction.  Per the hospitalist at [facility], they were having difficulty diuresing her with worsening renal function with increasing doses of diuretics without improvement in urine output.  The patient complains of shortness of breath that has been ongoing for several weeks.  She reports she has had a significant amount of fluid that she has been having difficulty getting rid of.  The chart documents a 50 pound weight gain.  Renal consultation has been called for acute kidney injury, and chronic kidney disease, and difficulty with diuresis.  Note TTE with EF 60-65%, restricted filling; right vent systolic pressure 85 mm Hg.    1.  Acute kidney injury felt 2/2 cardiorenal.  She  appears to have significant right-sided congestive heart failure.  An echocardiogram revealed significant pulmonary hypertension.  - Improving with diuresis  - Continue lasix gtt.  Defer metolazone today and reassess tomorrow - No acute indication for dialysis.    2.  Chronic kidney disease stage III.  Her baseline serum creatinine runs around 1.27 from 06/01/2017.  2/2 DM and HTN  3.  Hyperkalemia.  resolved with diuresis   4.  Pulmonary hypertension.  Optimizing volume status with diuretics as above  5.  Diabetes mellitus. Per primary team   6. Normocytic anemia - no acute indication for transfusion.  Add on iron profile   [name] 10/30/2018 6:59 am

## 2018-10-30 NOTE — Consult Note (Signed)
[place] Nurse wound consult note Patient receiving care in [place]. Reason for Consult: unna boots to bilateral LE Wound type: venous stasis wounds to bilateral pre-tibial areas, healing Measurement: Right LE: 0.8cm x 0.4 cm, pink, healing.  Left LE: 4 cm x 2 cm, pink, healing Dressing procedure/placement/frequency: weekly application of unna boot by ortho tech after RN applies small foam dressings over wound areas. Monitor the wound area(s) for worsening of condition such as: Signs/symptoms of infection,  Increase in size,  Development of or worsening of odor, Development of pain, or increased pain at the affected locations.  Notify the medical team if any of these develop.  Thank you for the consult.  Discussed plan of care with the patient and bedside nurse.  [place] nurse will not follow at this time.  Please re-consult the [place] team if needed.  Val Riles, RN, MSN, CWOCN, CNS-BC, pager [phone]

## 2018-10-31 ENCOUNTER — Inpatient Hospital Stay (HOSPITAL_COMMUNITY): Payer: Medicare Other

## 2018-10-31 DIAGNOSIS — I2781 Cor pulmonale (chronic): Secondary | ICD-10-CM

## 2018-10-31 DIAGNOSIS — J449 Chronic obstructive pulmonary disease, unspecified: Secondary | ICD-10-CM

## 2018-10-31 LAB — MAGNESIUM: Magnesium: 2.3 mg/dL (ref 1.7–2.4)

## 2018-10-31 LAB — BASIC METABOLIC PANEL
Anion gap: 13 (ref 5–15)
BUN: 79 mg/dL — ABNORMAL HIGH (ref 8–23)
CO2: 33 mmol/L — ABNORMAL HIGH (ref 22–32)
Calcium: 8.8 mg/dL — ABNORMAL LOW (ref 8.9–10.3)
Chloride: 94 mmol/L — ABNORMAL LOW (ref 98–111)
Creatinine, Ser: 2.05 mg/dL — ABNORMAL HIGH (ref 0.44–1.00)
GFR calc Af Amer: 28 mL/min — ABNORMAL LOW (ref >60)
GFR calc non Af Amer: 24 mL/min — ABNORMAL LOW (ref >60)
Glucose, Bld: 167 mg/dL — ABNORMAL HIGH (ref 70–99)
Potassium: 3.6 mmol/L (ref 3.5–5.1)
Sodium: 140 mmol/L (ref 135–145)

## 2018-10-31 LAB — GLUCOSE, CAPILLARY
Glucose-Capillary: 160 mg/dL — ABNORMAL HIGH (ref 70–99)
Glucose-Capillary: 213 mg/dL — ABNORMAL HIGH (ref 70–99)
Glucose-Capillary: 185 mg/dL — ABNORMAL HIGH (ref 70–99)
Glucose-Capillary: 152 mg/dL — ABNORMAL HIGH (ref 70–99)
Glucose-Capillary: 126 mg/dL — ABNORMAL HIGH (ref 70–99)
Glucose-Capillary: 174 mg/dL — ABNORMAL HIGH (ref 70–99)

## 2018-10-31 LAB — POTASSIUM: Potassium: 3.6 mmol/L (ref 3.5–5.1)

## 2018-10-31 LAB — PHOSPHORUS: Phosphorus: 3.9 mg/dL (ref 2.5–4.6)

## 2018-10-31 MED ORDER — POTASSIUM CHLORIDE 10 MEQ/50ML IV SOLN
10.0000 meq | INTRAVENOUS | Status: AC
  Administered 2018-10-31 (×2): 10 meq via INTRAVENOUS
  Filled 2018-10-31 (×2): qty 50

## 2018-10-31 MED ORDER — SODIUM CHLORIDE 0.9 % IV SOLN
INTRAVENOUS | Status: DC | PRN
  Administered 2018-10-31: 250 mL via INTRAVENOUS

## 2018-10-31 MED ORDER — SODIUM CHLORIDE 0.9 % IV SOLN
510.0000 mg | Freq: Once | INTRAVENOUS | Status: AC
  Administered 2018-10-31: 510 mg via INTRAVENOUS
  Filled 2018-10-31: qty 17

## 2018-10-31 MED ORDER — ONDANSETRON HCL 4 MG PO TABS
4.0000 mg | ORAL_TABLET | Freq: Three times a day (TID) | ORAL | Status: DC | PRN
  Administered 2018-11-01 – 2018-11-02 (×2): 4 mg via ORAL
  Filled 2018-10-31 (×2): qty 1

## 2018-10-31 NOTE — Progress Notes (Signed)
[facility]    NEPHROLOGY PROGRESS NOTE  SUBJECTIVE:  She had 6 liters urine output on 6/24.  Patient has been on a lasix gtt at 10 mg/hr.  Last metolazone on 6/23.  She has continued on levo - currently at 5 mcg/min   Review of systems:  Shortness of breath improving; still with dyspnea on exertion  Denies cp Some nausea; appetite not good  --------------------- Background on consult:  The patient is a 68 y.o. year old female patient with a past medical history significant for type 2 diabetes for more than 10 years, hyperlipidemia, hypertension, COPD, and breast cancer who presented as a transfer from [place] due to concerns for worsening renal function.  She was noted to have moderate to severe pulmonary hypertension with diastolic dysfunction.  Per the hospitalist at [facility], they were having difficulty diuresing her with worsening renal function with increasing doses of diuretics without improvement in urine output.  The patient complains of shortness of breath that has been ongoing for several weeks.  She reports she has had a significant amount of fluid that she has been having difficulty getting rid of.  The chart documents a 50 pound weight gain.  Renal consultation has been called for acute kidney injury, and chronic kidney disease, and difficulty with diuresis.  Note TTE with EF 60-65%, restricted filling; right vent systolic pressure 85 mm Hg.    OBJECTIVE:  Vitals:   10/31/18 0640 10/31/18 0645  BP:  (!) 125/59  Pulse:  71  Resp:  (!) 30  Temp: 98.2 F (36.8 C)   SpO2:  99%    Intake/Output Summary (Last 24 hours) at 10/31/2018 5038 Last data filed at 10/31/2018 0600 Gross per 24 hour  Intake 1089.62 ml  Output 5985 ml  Net -4895.38 ml      General:  Elderly female in bed in NAD HEENT: MMM [place] AT anicteric sclera Neck: JVD+  CV:  Heart RRR ; no rub Lungs: clear but reduced anteriorly; unlabored at rest Abd:  abd SNT/obese habitus/distended GU:   Foley in place Extremities: 2+ bilateral lower extremity edema.  MEDICATIONS:  . amiodarone  200 mg Oral Daily  . aspirin EC  81 mg Oral Daily  . atorvastatin  20 mg Oral Daily  . Chlorhexidine Gluconate Cloth  6 each Topical Q0600  . Chlorhexidine Gluconate Cloth  6 each Topical Daily  . heparin  5,000 Units Subcutaneous Q8H  . insulin aspart  0-5 Units Subcutaneous QHS  . insulin aspart  0-9 Units Subcutaneous TID WC  . ipratropium-albuterol  3 mL Nebulization TID  . midodrine  10 mg Oral TID WC  . mometasone-formoterol  2 puff Inhalation BID  . mupirocin ointment  1 application Nasal BID       LABS:   CBC Latest Ref Rng & Units 10/29/2018 10/28/2018 10/27/2018  WBC 4.0 - 10.5 K/uL 5.8 8.9 8.2  Hemoglobin 12.0 - 15.0 g/dL 9.1(L) 8.9(L) 8.7(L)  Hematocrit 36.0 - 46.0 % 30.7(L) 29.9(L) 30.1(L)  Platelets 150 - 400 K/uL 208 209 196    CMP Latest Ref Rng & Units 10/31/2018 10/30/2018 10/30/2018  Glucose 70 - 99 mg/dL 167(H) - 164(H)  BUN 8 - 23 mg/dL 79(H) - 86(H)  Creatinine 0.44 - 1.00 mg/dL 2.05(H) - 2.56(H)  Sodium 135 - 145 mmol/L 140 - 139  Potassium 3.5 - 5.1 mmol/L 3.6 3.9 4.0  Chloride 98 - 111 mmol/L 94(L) - 97(L)  CO2 22 - 32 mmol/L 33(H) - 31  Calcium 8.9 - 10.3 mg/dL 8.8(L) - 8.8(L)  Total Protein 6.5 - 8.1 g/dL - - -  Total Bilirubin 0.3 - 1.2 mg/dL - - -  Alkaline Phos 38 - 126 U/L - - -  AST 15 - 41 U/L - - -  ALT 0 - 44 U/L - - -    Lab Results  Component Value Date   CALCIUM 8.8 (L) 10/31/2018   PHOS 3.9 10/31/2018    No results found for: COLORURINE, APPEARANCEUR, LABSPEC, PHURINE, GLUCOSEU, HGBUR, BILIRUBINUR, KETONESUR, PROTEINUR, UROBILINOGEN, NITRITE, LEUKOCYTESUR    Component Value Date/Time   PHART 7.380 10/27/2018 2118   PCO2ART 47.5 10/27/2018 2118   PO2ART 79.2 (L) 10/27/2018 2118   HCO3 27.4 10/27/2018 2118   O2SAT 96.1 10/27/2018 2118       Component Value Date/Time   IRON 26 (L) 10/30/2018 0743   TIBC 344 10/30/2018 0743   FERRITIN  25 10/30/2018 0743   IRONPCTSAT 8 (L) 10/30/2018 0743       ASSESSMENT/PLAN:     1.  Acute kidney injury felt 2/2 cardiorenal.  She appears to have significant right-sided congestive heart failure.  An echocardiogram revealed significant pulmonary hypertension.  - Improving with diuresis  - Continue lasix gtt.  Defer metolazone today - urine output excellent  - check K at 1300 and replete as needed given lasix gtt   2.  Chronic kidney disease stage III.  Her baseline serum creatinine runs around 1.27 from 06/01/2017.  2/2 DM and HTN  3.  Hyperkalemia.  resolved with diuresis   4.  Pulmonary hypertension.  Optimizing volume status with diuretics as above  5.  Diabetes mellitus. Per primary team   6. Normocytic anemia - no acute indication for transfusion.  Iron deficiency.  Feraheme x 1 ordered   [name] 10/31/2018 7:14 AM

## 2018-10-31 NOTE — Progress Notes (Signed)
[facility] Progress Note Patient Name: Tammy Mathews DOB: 10-06-1950 MRN: [id number]   Date of Service  10/31/2018  HPI/Events of Note  K+ 3.6  eICU Interventions  Elink electrolyte replacement orders for K+        Verlin Duke U [place] Steelman 10/31/2018, 6:15 AM

## 2018-10-31 NOTE — Progress Notes (Signed)
Name: Tammy Mathews MRN: [id number] DOB: 17-Nov-1950 Date of service : 10/28/18 CC: SOB   LOS: 4  [facility] Critical Care Note  History of Present Illness: [name], transferred from another hospital to [facility] due to concerns for worsening renal function and shortness of breath.  HX includes breast cancer, morbid obesity, hypertension, hyperlipidemia, diabetes mellitus, chronic kidney disease and COPD on 3 L of oxygen.  Patient was reported to have left-sided chest pain, shortness of breath, dyspnea on exertion, orthopnea, leg edema and 50 pound weight gain in the last month.   Previous Echocardiogram reveals moderate to severe pulmonary hypertension with likely diastolic dysfunction. She was admitted to cardiology floor 6/21 but has gotten progressively worsening shortness of breath, needing BIPAP . Lasix drip initiated by nephrology and thus far very poor urine output.  Lines / Drains: Right subclavian port  Cultures: COVID neg  Antibiotics: NONE  Significant tests:  Echo 6/22 >> normal LV systolic function, decreased RV SF, RVSP 83 with D-shaped septum  Events : 6/22 lasix + levo gtt , off bipap 6/25 still diuresing, good urine output.   Subjective: No issues overnight.  Remains on low-dose Levophed.  Good urine output.   Intake/Output Summary (Last 24 hours) at 10/31/2018 0834 Last data filed at 10/31/2018 0600 Gross per 24 hour  Intake 849.62 ml  Output 5685 ml  Net -4835.38 ml     Vital Signs: Temp:  [98.1 F (36.7 C)-99 F (37.2 C)] 98.2 F (36.8 C) (06/25 0640) Pulse Rate:  [61-89] 71 (06/25 0645) Resp:  [14-32] 30 (06/25 0645) BP: (76-141)/(37-96) 125/59 (06/25 0645) SpO2:  [94 %-100 %] 98 % (06/25 0731) Weight:  [127.9 kg] 127.9 kg (06/25 0500) I/O last 3 completed shifts: In: 1437.2 [P.O.:720; I.V.:717.2] Out: 7965 [Urine:7965]   Physical Examination: General: Elderly, chronically appealing morbidly obese female resting  in bed no distress HEENT: Large neck no JVD Neuro: Awake alert, following commands no focal deficit moves all 4 extremities CV: Distant heart tones S1-S2 PULM: Decreased breath sounds throughout, no crackles no wheeze GI: Soft, nontender, nondistended, bowel sounds present Extremities: Bilateral lower extremity peripheral vascular disease, hyperpigmentation of the lower extremities, difficult to palpate pulses Skin: Chronic venous stasis changes bilaterally  Chest x-ray 6/21 personally reviewed which shows bilateral atelectasis and small effusions, elevated right hemidiaphragm   Assessment and Plan: Principal Problem:   Acute on chronic respiratory failure with hypoxia and hypercapnia (HCC) Active Problems:   Volume overload   AKI (acute kidney injury) (HCC)   CKD (chronic kidney disease)   Chronic diastolic CHF (congestive heart failure) (HCC)   COPD (chronic obstructive pulmonary disease) (HCC)   Acute respiratory distress   Acute-on-chronic renal failure (HCC)   Palliative care by specialist   Goals of care, counseling/discussion   Acute on chronic diastolic heart failure Severe pulmonary hypertension Right sided heart failure, cor pulmonale Cardiogenic shock requiring vasopressor support -Patient remains on low-dose norepinephrine to maintain mean artery pressure greater than 65 mmHg. -Wean vasopressors to maintain mean arterial pressure as above. -Continue IV diuresis maintain euvolemia. -Management of pulmonary hypertension secondary to diastolic heart failure and volume management.  Cardiorenal syndrome AKI on CKD CKD stage III at baseline -Appreciate nephrology input.  Continue IV diuresis as tolerated by patient.  Electrolyte imbalances secondary to diuresis -We will continue to replete potassium with a goal greater than 4 and magnesium greater than 2  Acute on chronic hypoxemic hypercarbic respiratory failure -Patient needs to use positive pressure  ventilation  at nighttime with sleeping and naps.  She is currently refusing this.  We will counseled the patient again on the importance of this. - Continue to wean nasal cannula O2 supplementation to maintain O2 sats greater than 90%  Diabetes type 2 CBGs with SSI  This patient is critically ill with multiple organ system failure; which, requires frequent high complexity decision making, assessment, support, evaluation, and titration of therapies. This was completed through the application of advanced monitoring technologies and extensive interpretation of multiple databases. During this encounter critical care time was devoted to patient care services described in this note for 34 minutes.  Garner Nash, DO [facility] 10/31/2018 8:36 AM  Personal pager: [phone] If unanswered, please page CCM On-call: [phone]

## 2018-10-31 NOTE — Progress Notes (Signed)
Patient refused BIPAP for the night.

## 2018-10-31 NOTE — Progress Notes (Signed)
Pt refused BIPAP for tonight.  I assessed her and no distress noted at this time.  Advised RN if patient states to decline during the night to call and we will administer BIPAP.

## 2018-10-31 NOTE — Progress Notes (Signed)
Physical Therapy Treatment Patient Details Name: Tammy Mathews MRN: [id number] DOB: 1951/04/13 Today's Date: 10/31/2018    History of Present Illness Tammy Mathews is a 68 y.o. female with medical history significant of breast cancer in remission, morbid obesity, hypertension, hyperlipidemia, type 2 diabetes, COPD and chronic hypoxic respiratory failure on 3 L home oxygen presented to [facility] with complains of left-sided chest pain, progressive shortness of breath, orthopnea, 50 pound weight gain over the last several months, and bilateral lower extremity edema.    PT Comments    Pt admitted with above diagnosis. Pt currently with functional limitations due to balance and endurance deficits. Pt was able to ambulate with [place] walker a short distance with min assist +2 for close chair follow.  Pt progressing.  Pt will benefit from skilled PT to increase their independence and safety with mobility to allow discharge to the venue listed below.     Follow Up Recommendations  SNF;Supervision/Assistance - 24 hour     Equipment Recommendations  None recommended by PT    Recommendations for Other Services       Precautions / Restrictions Precautions Precautions: Fall Precaution Comments: morbidly obese Restrictions Weight Bearing Restrictions: No    Mobility  Bed Mobility Overal bed mobility: Needs Assistance Bed Mobility: Supine to Sit     Supine to sit: Mod assist;Min assist;HOB elevated     General bed mobility comments: pt initiated movement of LEs over to EOB, pulled with L UE on hand rail , due to body habitus has to pull on PT with R UE.   Transfers Overall transfer level: Needs assistance Equipment used: Rolling walker (2 wheeled) Transfers: Sit to/from [facility] Sit to Stand: Min assist;Mod assist;+2 physical assistance;+2 safety/equipment;From elevated surface         General transfer comment: 2nd person there for safety due to obesity, pt  however able to power up with minA - mod assist x2  to [facility] walker, +SOB, SPO2 at 90% on 6Lo2 via [place]   Ambulation/Gait Ambulation/Gait assistance: Min assist;Min guard;+2 safety/equipment Gait Distance (Feet): 5 Feet Assistive device: (Eva walker) Gait Pattern/deviations: Shuffle;Trunk flexed;Wide base of support;Drifts right/left;Antalgic;Decreased stride length;Step-through pattern   Gait velocity interpretation: <1.31 ft/sec, indicative of household ambulator General Gait Details: Pt was able to ambulate a few steps with [place] walker for support.  Cues needed to stand tall.  Pt needed a little assist steering [place] walker as well.  Close chair follow.    Stairs             Wheelchair Mobility    Modified Rankin (Stroke Patients Only)       Balance Overall balance assessment: Needs assistance Sitting-balance support: Feet supported;Bilateral upper extremity supported Sitting balance-Leahy Scale: Fair     Standing balance support: Bilateral upper extremity supported Standing balance-Leahy Scale: Fair Standing balance comment: dependent on Eva walker                            Cognition Arousal/Alertness: Awake/alert Behavior During Therapy: WFL for tasks assessed/performed Overall Cognitive Status: Within Functional Limits for tasks assessed                                        Exercises General Exercises - Lower Extremity Ankle Circles/Pumps: AROM;Both;10 reps;Supine Long Arc Quad: AROM;Both;10 reps;Seated Hip Flexion/Marching: AROM;Both;10 reps;Seated    General  Comments        Pertinent Vitals/Pain Pain Assessment: Faces Faces Pain Scale: Hurts little more Pain Location: bilat knees due to arthritis Pain Descriptors / Indicators: (arthritic pain) Pain Intervention(s): Limited activity within patient's tolerance;Monitored during session;Repositioned    Home Living                      Prior Function            PT  Goals (current goals can now be found in the care plan section) Acute Rehab PT Goals Patient Stated Goal: get back to walking Progress towards PT goals: Progressing toward goals    Frequency    Min 3X/week      PT Plan Current plan remains appropriate    Co-evaluation              AM-PAC PT "6 Clicks" Mobility   Outcome Measure  Help needed turning from your back to your side while in a flat bed without using bedrails?: A Lot Help needed moving from lying on your back to sitting on the side of a flat bed without using bedrails?: A Lot Help needed moving to and from a bed to a chair (including a wheelchair)?: A Lot Help needed standing up from a chair using your arms (e.g., wheelchair or bedside chair)?: A Lot Help needed to walk in hospital room?: A Lot Help needed climbing 3-5 steps with a railing? : Total 6 Click Score: 11    End of Session Equipment Utilized During Treatment: Gait belt;Oxygen Activity Tolerance: Patient tolerated treatment well Patient left: in chair;with call bell/phone within reach Nurse Communication: Mobility status PT Visit Diagnosis: Unsteadiness on feet (R26.81);Difficulty in walking, not elsewhere classified (R26.2)     Time: 8004-4715 PT Time Calculation (min) (ACUTE ONLY): 28 min  Charges:  $Gait Training: 8-22 mins $Therapeutic Exercise: 8-22 mins                     [place] Pager:  [phone]  Office:  [place] 10/31/2018, 10:07 AM

## 2018-10-31 NOTE — Progress Notes (Signed)
PCCM:  I spoke to Dr. Marylyn Ishihara from the [facility] hospitalist service who has agreed to pick up patient for tomorrow 11/01/2018 to stepdown unit level care.   Garner Nash, DO [facility] 10/31/2018 5:25 PM

## 2018-11-01 LAB — BASIC METABOLIC PANEL
Anion gap: 11 (ref 5–15)
BUN: 69 mg/dL — ABNORMAL HIGH (ref 8–23)
CO2: 39 mmol/L — ABNORMAL HIGH (ref 22–32)
Calcium: 8.8 mg/dL — ABNORMAL LOW (ref 8.9–10.3)
Chloride: 89 mmol/L — ABNORMAL LOW (ref 98–111)
Creatinine, Ser: 1.9 mg/dL — ABNORMAL HIGH (ref 0.44–1.00)
GFR calc Af Amer: 31 mL/min — ABNORMAL LOW (ref >60)
GFR calc non Af Amer: 26 mL/min — ABNORMAL LOW (ref >60)
Glucose, Bld: 149 mg/dL — ABNORMAL HIGH (ref 70–99)
Potassium: 3.5 mmol/L (ref 3.5–5.1)
Sodium: 139 mmol/L (ref 135–145)

## 2018-11-01 LAB — GLUCOSE, CAPILLARY
Glucose-Capillary: 138 mg/dL — ABNORMAL HIGH (ref 70–99)
Glucose-Capillary: 167 mg/dL — ABNORMAL HIGH (ref 70–99)
Glucose-Capillary: 151 mg/dL — ABNORMAL HIGH (ref 70–99)
Glucose-Capillary: 169 mg/dL — ABNORMAL HIGH (ref 70–99)
Glucose-Capillary: 148 mg/dL — ABNORMAL HIGH (ref 70–99)

## 2018-11-01 LAB — CBC
HCT: 28.8 % — ABNORMAL LOW (ref 36.0–46.0)
Hemoglobin: 8.5 g/dL — ABNORMAL LOW (ref 12.0–15.0)
MCH: 26.1 pg (ref 26.0–34.0)
MCHC: 29.5 g/dL — ABNORMAL LOW (ref 30.0–36.0)
MCV: 88.3 fL (ref 80.0–100.0)
Platelets: 163 10*3/uL (ref 150–400)
RBC: 3.26 MIL/uL — ABNORMAL LOW (ref 3.87–5.11)
RDW: 15.9 % — ABNORMAL HIGH (ref 11.5–15.5)
WBC: 4 10*3/uL (ref 4.0–10.5)
nRBC: 0 % (ref 0.0–0.2)

## 2018-11-01 LAB — POTASSIUM: Potassium: 3.4 mmol/L — ABNORMAL LOW (ref 3.5–5.1)

## 2018-11-01 MED ORDER — MECLIZINE HCL 25 MG PO TABS
25.0000 mg | ORAL_TABLET | Freq: Three times a day (TID) | ORAL | Status: DC | PRN
  Administered 2018-11-03: 25 mg via ORAL
  Filled 2018-11-01: qty 1
  Filled 2018-11-01: qty 2

## 2018-11-01 NOTE — Progress Notes (Signed)
[facility]    NEPHROLOGY PROGRESS NOTE  SUBJECTIVE:  She had 6 liters urine output on 6/25.  Patient has been on a lasix gtt at 10 mg/hr.  Last metolazone on 6/23.  Levo was weaned off yesterday.   Review of systems:  Shortness of breath improving; still with dyspnea on exertion  Denies cp Nausea better  --------------------- Background on consult:  The patient is a 68 y.o. year old female patient with a past medical history significant for type 2 diabetes for more than 10 years, hyperlipidemia, hypertension, COPD, and breast cancer who presented as a transfer from [place] due to concerns for worsening renal function.  She was noted to have moderate to severe pulmonary hypertension with diastolic dysfunction.  Per the hospitalist at [facility], they were having difficulty diuresing her with worsening renal function with increasing doses of diuretics without improvement in urine output.  The patient complains of shortness of breath that has been ongoing for several weeks.  She reports she has had a significant amount of fluid that she has been having difficulty getting rid of.  The chart documents a 50 pound weight gain.  Renal consultation has been called for acute kidney injury, and chronic kidney disease, and difficulty with diuresis.  Note TTE with EF 60-65%, restricted filling; right vent systolic pressure 85 mm Hg.    OBJECTIVE:  Vitals:   11/01/18 0500 11/01/18 0600  BP: (!) 109/51 122/69  Pulse: 67 74  Resp: 17 (!) 32  Temp:    SpO2: 99% 100%    Intake/Output Summary (Last 24 hours) at 11/01/2018 0700 Last data filed at 11/01/2018 0600 Gross per 24 hour  Intake 833.11 ml  Output 5950 ml  Net -5116.89 ml      General:  Elderly female in bed in NAD HEENT: MMM [place] AT anicteric sclera Neck: JVD+  CV:  Heart RRR ; no rub Lungs: reduced anteriorly; unlabored at rest; on supplemental oxygen  Abd:  abd SNT/obese habitus/distended GU:  Foley in place Extremities:  1-2+ bilateral lower extremity edema.  MEDICATIONS:  . amiodarone  200 mg Oral Daily  . aspirin EC  81 mg Oral Daily  . atorvastatin  20 mg Oral Daily  . Chlorhexidine Gluconate Cloth  6 each Topical Daily  . heparin  5,000 Units Subcutaneous Q8H  . insulin aspart  0-5 Units Subcutaneous QHS  . insulin aspart  0-9 Units Subcutaneous TID WC  . ipratropium-albuterol  3 mL Nebulization TID  . midodrine  10 mg Oral TID WC  . mometasone-formoterol  2 puff Inhalation BID  . mupirocin ointment  1 application Nasal BID       LABS:   CBC Latest Ref Rng & Units 10/29/2018 10/28/2018 10/27/2018  WBC 4.0 - 10.5 K/uL 5.8 8.9 8.2  Hemoglobin 12.0 - 15.0 g/dL 9.1(L) 8.9(L) 8.7(L)  Hematocrit 36.0 - 46.0 % 30.7(L) 29.9(L) 30.1(L)  Platelets 150 - 400 K/uL 208 209 196    CMP Latest Ref Rng & Units 10/31/2018 10/31/2018 10/30/2018  Glucose 70 - 99 mg/dL - 167(H) -  BUN 8 - 23 mg/dL - 79(H) -  Creatinine 0.44 - 1.00 mg/dL - 2.05(H) -  Sodium 135 - 145 mmol/L - 140 -  Potassium 3.5 - 5.1 mmol/L 3.6 3.6 3.9  Chloride 98 - 111 mmol/L - 94(L) -  CO2 22 - 32 mmol/L - 33(H) -  Calcium 8.9 - 10.3 mg/dL - 8.8(L) -  Total Protein 6.5 - 8.1 g/dL - - -  Total Bilirubin 0.3 - 1.2 mg/dL - - -  Alkaline Phos 38 - 126 U/L - - -  AST 15 - 41 U/L - - -  ALT 0 - 44 U/L - - -    Lab Results  Component Value Date   CALCIUM 8.8 (L) 10/31/2018   PHOS 3.9 10/31/2018    No results found for: COLORURINE, APPEARANCEUR, LABSPEC, PHURINE, GLUCOSEU, HGBUR, BILIRUBINUR, KETONESUR, PROTEINUR, UROBILINOGEN, NITRITE, LEUKOCYTESUR    Component Value Date/Time   PHART 7.380 10/27/2018 2118   PCO2ART 47.5 10/27/2018 2118   PO2ART 79.2 (L) 10/27/2018 2118   HCO3 27.4 10/27/2018 2118   O2SAT 96.1 10/27/2018 2118       Component Value Date/Time   IRON 26 (L) 10/30/2018 0743   TIBC 344 10/30/2018 0743   FERRITIN 25 10/30/2018 0743   IRONPCTSAT 8 (L) 10/30/2018 0743       ASSESSMENT/PLAN:     1.  Acute kidney  injury felt 2/2 cardiorenal.  She appears to have significant right-sided congestive heart failure.  An echocardiogram revealed significant pulmonary hypertension.  - Continues to improve with diuresis per last labs - Continue lasix gtt.  Defer metolazone today - urine output remains excellent  - check K at 1600 and replete as needed given lasix gtt  - Await AM labs   2.  Chronic kidney disease stage III.  Her baseline serum creatinine runs around 1.27 from 06/01/2017.  2/2 DM and HTN  3.  Hyperkalemia.  resolved with diuresis   4.  Pulmonary hypertension.  Optimizing volume status with diuretics as above  5.  Diabetes mellitus. Per primary team   6. Normocytic anemia - no acute indication for transfusion.  Iron deficiency.  Feraheme x 1 on 6/25.  CBC is ordered  [name] 11/01/2018 7:00 AM

## 2018-11-01 NOTE — Progress Notes (Signed)
[name]  PROGRESS NOTE    Tammy Mathews  PPI:[id number] DOB: May 01, 1951 DOA: 10/27/2018 PCP: Greig Right, MD   Brief Narrative:    [name], transferred from another hospital to [facility] due to concerns for worsening renal function and shortness of breath. HX includes breast cancer, morbid obesity, hypertension, hyperlipidemia, diabetes mellitus, chronic kidney disease and COPD on 3 L of oxygen. Patient was reported to have left-sided chest pain, shortness of breath, dyspnea on exertion, orthopnea, leg edema and 50 pound weight gain in the last month.  Previous Echocardiogram reveals moderate to severe pulmonary hypertension with likely diastolic dysfunction. She was admitted to cardiology floor 6/21 but has gotten progressively worsening shortness of breath, needing BIPAP . Lasix drip initiated by nephrology and thus far very poor urine output.   Assessment & Plan:   Principal Problem:   Acute on chronic respiratory failure with hypoxia and hypercapnia (HCC) Active Problems:   Volume overload   AKI (acute kidney injury) (HCC)   CKD (chronic kidney disease)   Chronic diastolic CHF (congestive heart failure) (HCC)   COPD (chronic obstructive pulmonary disease) (HCC)   Acute respiratory distress   Acute-on-chronic renal failure (HCC)   Palliative care by specialist   Goals of care, counseling/discussion   Acute on chronic diastolic heart failure Severe pulmonary hypertension Right sided heart failure, cor pulmonale Cardiogenic shock requiring vasopressor support     - now off pressors; BPs ok     - still on lasix gtt; appreciate nephro assistance  Cardiorenal syndrome AKI on CKD CKD stage III at baseline     - Continue IV diuresis as tolerated by patient; appreciate nephro direction  Electrolyte imbalances secondary to diuresis     - watch K+/Mg2+; maintain K+ grtr than 4 and Mg2+ grtr than 2  Acute on chronic hypoxemic hypercarbic respiratory  failure     - CPAP/BiPAP at night; O2 support during the day; wean as able  Diabetes type 2     - CBGs with SSI  Vertigo     - resume meclazine  Normocytic anemia     - stable, no frank bleed noted, monitor     - iron labs noted  Morbid obesity     - diet/exercise as able  Debility     - PT consult recs SNF  Chronic periodontitis with severe bone loss Generalized gingival recession Supra eruption and drifting of the unopposed teeth into the edentulous areas     - eval'd by dental team; patient has denied surgical correction at this time  COPD     - home regimen: pulmicort/albuterol     - continue dulera, duoneb   DVT prophylaxis: heparin Code Status: FULL   Disposition Plan: TBD   Consultants:   Nephrology    Subjective: "I think I'm doing ok."  Objective: Vitals:   11/01/18 0600 11/01/18 0739 11/01/18 0740 11/01/18 1129  BP: 122/69     Pulse: 74     Resp: (!) 32     Temp:   98.2 F (36.8 C) 98 F (36.7 C)  TempSrc:   Oral Oral  SpO2: 100% 99%    Weight:      Height:        Intake/Output Summary (Last 24 hours) at 11/01/2018 1401 Last data filed at 11/01/2018 1200 Gross per 24 hour  Intake 399.2 ml  Output 4675 ml  Net -4275.8 ml   Filed Weights   10/30/18 0500 10/31/18 0500 11/01/18 0411  Weight: 132.8  kg 127.9 kg 124.6 kg    Examination:  General: 68 y.o. female resting in bed in NAD Cardiovascular: RRR, +S1, S2, no m/g/r, equal pulses throughout Respiratory: CTABL, no w/r/r, shallow breath but she appears comfortable, decreased sounds at bases GI: BS+, NDNT, no masses noted, no organomegaly noted, morbidly obese MSK: No c/c; BLE edema Skin: No rashes, bruises, ulcerations noted Neuro: A&O x 3, no focal deficits   Data Reviewed: I have personally reviewed following labs and imaging studies.  CBC: Recent Labs  Lab 10/27/18 2210 10/28/18 0631 10/29/18 0658 11/01/18 0710  WBC 8.2 8.9 5.8 4.0  NEUTROABS 7.2  --   --   --   HGB  8.7* 8.9* 9.1* 8.5*  HCT 30.1* 29.9* 30.7* 28.8*  MCV 90.9 87.9 88.2 88.3  PLT 196 209 208 462   Basic Metabolic Panel: Recent Labs  Lab 10/27/18 2249 10/28/18 0631  10/29/18 0658 10/29/18 1437 10/30/18 0311 10/30/18 1543 10/31/18 0253 10/31/18 1421 11/01/18 0710  NA 135 138   < > 140 139 139  --  140  --  139  K 5.0 5.0   < > 3.9 3.9 4.0 3.9 3.6 3.6 3.5  CL 98 100   < > 98 99 97*  --  94*  --  89*  CO2 26 26   < > _0 --  33*  --  39*  GLUCOSE 182* 172*   < > 149* 165* 164*  --  167*  --  149*  BUN 95* 97*   < > 89* 89* 86*  --  79*  --  69*  CREATININE 3.65* 3.71*   < > 2.97* 3.27* 2.56*  --  2.05*  --  1.90*  CALCIUM 8.2* 8.4*   < > 8.6* 8.6* 8.8*  --  8.8*  --  8.8*  MG  --  2.9*  --  2.6*  --  2.7*  --  2.3  --   --   PHOS 6.0* 5.8*  --  4.9*  --  4.7*  --  3.9  --   --    < > = values in this interval not displayed.   GFR: Estimated Creatinine Clearance: 34.6 mL/min (A) (by C-G formula based on SCr of 1.9 mg/dL (H)). Liver Function Tests: Recent Labs  Lab 10/27/18 1227 10/27/18 2249 10/28/18 0631  AST 40  --   --   ALT 17  --   --   ALKPHOS 186*  --   --   BILITOT 0.8  --   --   PROT 7.2  --   --   ALBUMIN 2.8* 3.1* 3.2*   No results for input(s): LIPASE, AMYLASE in the last 168 hours. No results for input(s): AMMONIA in the last 168 hours. Coagulation Profile: No results for input(s): INR, PROTIME in the last 168 hours. Cardiac Enzymes: No results for input(s): CKTOTAL, CKMB, CKMBINDEX, TROPONINI in the last 168 hours. BNP (last 3 results) No results for input(s): PROBNP in the last 8760 hours. HbA1C: No results for input(s): HGBA1C in the last 72 hours. CBG: Recent Labs  Lab 10/31/18 1534 10/31/18 2110 11/01/18 0631 11/01/18 0757 11/01/18 1129  GLUCAP 126* 174* 138* 167* 151*   Lipid Profile: No results for input(s): CHOL, HDL, LDLCALC, TRIG, CHOLHDL, LDLDIRECT in the last 72 hours. Thyroid Function Tests: No results for input(s): TSH,  T4TOTAL, FREET4, T3FREE, THYROIDAB in the last 72 hours. Anemia Panel: Recent Labs    10/30/18 0743  FERRITIN 25  TIBC 344  IRON 26*   Sepsis Labs: Recent Labs  Lab 10/27/18 2249  PROCALCITON 0.27    Recent Results (from the past 240 hour(s))  SARS Coronavirus 2 (CEPHEID- Performed in [place] hospital lab), Hosp Order     Status: None   Collection Time: 10/27/18  4:05 AM   Specimen: Nasopharyngeal Swab  Result Value Ref Range Status   SARS Coronavirus 2 NEGATIVE NEGATIVE Final    Comment: (NOTE) If result is NEGATIVE SARS-CoV-2 target nucleic acids are NOT DETECTED. The SARS-CoV-2 RNA is generally detectable in upper and lower  respiratory specimens during the acute phase of infection. The lowest  concentration of SARS-CoV-2 viral copies this assay can detect is 250  copies / mL. A negative result does not preclude SARS-CoV-2 infection  and should not be used as the sole basis for treatment or other  patient management decisions.  A negative result may occur with  improper specimen collection / handling, submission of specimen other  than nasopharyngeal swab, presence of viral mutation(s) within the  areas targeted by this assay, and inadequate number of viral copies  (<250 copies / mL). A negative result must be combined with clinical  observations, patient history, and epidemiological information. If result is POSITIVE SARS-CoV-2 target nucleic acids are DETECTED. The SARS-CoV-2 RNA is generally detectable in upper and lower  respiratory specimens dur ing the acute phase of infection.  Positive  results are indicative of active infection with SARS-CoV-2.  Clinical  correlation with patient history and other diagnostic information is  necessary to determine patient infection status.  Positive results do  not rule out bacterial infection or co-infection with other viruses. If result is PRESUMPTIVE POSTIVE SARS-CoV-2 nucleic acids MAY BE PRESENT.   A presumptive  positive result was obtained on the submitted specimen  and confirmed on repeat testing.  While 2019 novel coronavirus  (SARS-CoV-2) nucleic acids may be present in the submitted sample  additional confirmatory testing may be necessary for epidemiological  and / or clinical management purposes  to differentiate between  SARS-CoV-2 and other Sarbecovirus currently known to infect humans.  If clinically indicated additional testing with an alternate test  methodology [phone]) is advised. The SARS-CoV-2 RNA is generally  detectable in upper and lower respiratory sp ecimens during the acute  phase of infection. The expected result is Negative. Fact Sheet for Patients:  StrictlyIdeas.no Fact Sheet for Healthcare Providers: BankingDealers.co.za This test is not yet approved or cleared by the [place] FDA and has been authorized for detection and/or diagnosis of SARS-CoV-2 by FDA under an Emergency Use Authorization (EUA).  This EUA will remain in effect (meaning this test can be used) for the duration of the COVID-19 declaration under Section 564(b)(1) of the Act, 21 U.S.C. section 360bbb-3(b)(1), unless the authorization is terminated or revoked sooner. Performed at [facility], [place] [address] [place], [place] [address]   MRSA PCR Screening     Status: Abnormal   Collection Time: 10/28/18 12:08 AM   Specimen: Nasopharyngeal  Result Value Ref Range Status   MRSA by PCR POSITIVE (A) NEGATIVE Final    Comment:        The GeneXpert MRSA Assay (FDA approved for NASAL specimens only), is one component of a comprehensive MRSA colonization surveillance program. It is not intended to diagnose MRSA infection nor to guide or monitor treatment for MRSA infections. MCDUFFIE,E RN [phone] 10/28/2018 MITCHELL,L Performed at [facility]  [address] [place], [place] [address]          Radiology Studies: Dg Chest Port 1  View  Result Date: 10/31/2018 CLINICAL DATA:  Shortness of breath, cough. EXAM: PORTABLE CHEST 1 VIEW COMPARISON:  Radiographs of October 27, 2018. FINDINGS: Stable cardiomediastinal silhouette. Right subclavian Port-A-Cath is unchanged in position. No pneumothorax is noted. Stable bilateral opacities are noted most consistent with subsegmental atelectasis possibly infiltrates, right greater than left. Mild right pleural effusion is noted. Bony thorax is unremarkable. IMPRESSION: Stable bilateral lung opacities as described above with probable small right pleural effusion. Electronically Signed   By: Marijo Conception M.D.   On: 10/31/2018 08:49        Scheduled Meds: . amiodarone  200 mg Oral Daily  . aspirin EC  81 mg Oral Daily  . atorvastatin  20 mg Oral Daily  . Chlorhexidine Gluconate Cloth  6 each Topical Daily  . heparin  5,000 Units Subcutaneous Q8H  . insulin aspart  0-5 Units Subcutaneous QHS  . insulin aspart  0-9 Units Subcutaneous TID WC  . ipratropium-albuterol  3 mL Nebulization TID  . midodrine  10 mg Oral TID WC  . mometasone-formoterol  2 puff Inhalation BID   Continuous Infusions: . sodium chloride Stopped (10/31/18 0830)  . furosemide (LASIX) infusion 10 mg/hr (11/01/18 1200)  . norepinephrine (LEVOPHED) Adult infusion Stopped (10/31/18 0826)     LOS: 5 days    Time spent: 25 minutes spent in the coordination of care.    Jonnie Finner, DO [facility] Pager [phone]  If 7PM-7AM, please contact night-coverage [email or web address] Password [email or web address] 11/01/2018, 2:01 PM

## 2018-11-01 NOTE — Progress Notes (Signed)
Physical Therapy Treatment Patient Details Name: Tammy Mathews MRN: [id number] DOB: 1950/07/27 Today's Date: 11/01/2018    History of Present Illness Tammy Mathews is a 68 y.o. female with medical history significant of breast cancer in remission, morbid obesity, hypertension, hyperlipidemia, type 2 diabetes, COPD and chronic hypoxic respiratory failure on 3 L home oxygen presented to [facility] with complains of left-sided chest pain, progressive shortness of breath, orthopnea, 50 pound weight gain over the last several months, and bilateral lower extremity edema.    PT Comments    Pt admitted with above diagnosis. Pt currently with functional limitations due to balance and endruance deficits. Pt was able to ambulate in hallway with the [facility] walker with min guard to min assist with chair follow for safety as she did need sitting rest breaks.  Will follow acutely.  Pt will benefit from skilled PT to increase their independence and safety with mobility to allow discharge to the venue listed below.     Follow Up Recommendations  SNF;Supervision/Assistance - 24 hour     Equipment Recommendations  None recommended by PT    Recommendations for Other Services       Precautions / Restrictions Precautions Precautions: Fall Precaution Comments: morbidly obese Restrictions Weight Bearing Restrictions: No    Mobility  Bed Mobility Overal bed mobility: Needs Assistance Bed Mobility: Supine to Sit     Supine to sit: Min assist;HOB elevated     General bed mobility comments: pt initiated movement of LEs over to EOB, pulled with L UE on hand rail   Transfers Overall transfer level: Needs assistance Equipment used: Rolling walker (2 wheeled) Transfers: Sit to/from [employer] Transfers Sit to Stand: +2 safety/equipment;From elevated surface;Min guard;Min assist         General transfer comment: 2nd person there for safety due to obesity, pt however able to power up with  minA - ming guard assist   to [employer] walker, +SOB, SPO2 at 90% on 6Lo2 via [place]   Ambulation/Gait Ambulation/Gait assistance: Min assist;Min guard;+2 safety/equipment Gait Distance (Feet): 175 Feet(75 feet then 100 feet) Assistive device: (Eva walker) Gait Pattern/deviations: Shuffle;Trunk flexed;Wide base of support;Drifts right/left;Antalgic;Decreased stride length;Step-through pattern   Gait velocity interpretation: <1.31 ft/sec, indicative of household ambulator General Gait Details: Pt was able to ambulate in hallway with [place] walker for support.  Cues needed to stand tall.  Pt needed a little assist steering [place] walker as well.  Followed pt with chair.    Stairs             Wheelchair Mobility    Modified Rankin (Stroke Patients Only)       Balance Overall balance assessment: Needs assistance Sitting-balance support: Feet supported;Bilateral upper extremity supported Sitting balance-Leahy Scale: Fair     Standing balance support: Bilateral upper extremity supported Standing balance-Leahy Scale: Fair Standing balance comment: dependent on Eva walker                            Cognition Arousal/Alertness: Awake/alert Behavior During Therapy: WFL for tasks assessed/performed Overall Cognitive Status: Within Functional Limits for tasks assessed                                        Exercises General Exercises - Lower Extremity Ankle Circles/Pumps: AROM;Both;10 reps;Supine Long Arc Quad: AROM;Both;10 reps;Seated Hip Flexion/Marching: AROM;Both;10 reps;Seated    General  Comments        Pertinent Vitals/Pain Pain Assessment: Faces Faces Pain Scale: Hurts little more Pain Location: bilat knees due to arthritis Pain Descriptors / Indicators: (arthritic pain) Pain Intervention(s): Limited activity within patient's tolerance;Monitored during session;Repositioned    Home Living                      Prior Function             PT Goals (current goals can now be found in the care plan section) Acute Rehab PT Goals Patient Stated Goal: get back to walking Progress towards PT goals: Progressing toward goals    Frequency    Min 3X/week      PT Plan Current plan remains appropriate    Co-evaluation              AM-PAC PT "6 Clicks" Mobility   Outcome Measure  Help needed turning from your back to your side while in a flat bed without using bedrails?: A Little Help needed moving from lying on your back to sitting on the side of a flat bed without using bedrails?: A Little Help needed moving to and from a bed to a chair (including a wheelchair)?: A Little Help needed standing up from a chair using your arms (e.g., wheelchair or bedside chair)?: A Little Help needed to walk in hospital room?: A Little Help needed climbing 3-5 steps with a railing? : A Lot 6 Click Score: 17    End of Session Equipment Utilized During Treatment: Gait belt;Oxygen Activity Tolerance: Patient tolerated treatment well Patient left: in chair;with call bell/phone within reach;with chair alarm set Nurse Communication: Mobility status PT Visit Diagnosis: Unsteadiness on feet (R26.81);Difficulty in walking, not elsewhere classified (R26.2)     Time: 1140-1200 PT Time Calculation (min) (ACUTE ONLY): 20 min  Charges:  $Gait Training: 8-22 mins                     [place] Pager:  [phone]  Office:  [place] 11/01/2018, 1:26 PM

## 2018-11-02 LAB — BASIC METABOLIC PANEL
Anion gap: 13 (ref 5–15)
BUN: 59 mg/dL — ABNORMAL HIGH (ref 8–23)
CO2: 44 mmol/L — ABNORMAL HIGH (ref 22–32)
Calcium: 9 mg/dL (ref 8.9–10.3)
Chloride: 85 mmol/L — ABNORMAL LOW (ref 98–111)
Creatinine, Ser: 1.78 mg/dL — ABNORMAL HIGH (ref 0.44–1.00)
GFR calc Af Amer: 33 mL/min — ABNORMAL LOW (ref >60)
GFR calc non Af Amer: 29 mL/min — ABNORMAL LOW (ref >60)
Glucose, Bld: 145 mg/dL — ABNORMAL HIGH (ref 70–99)
Potassium: 3.5 mmol/L (ref 3.5–5.1)
Sodium: 142 mmol/L (ref 135–145)

## 2018-11-02 LAB — GLUCOSE, CAPILLARY
Glucose-Capillary: 144 mg/dL — ABNORMAL HIGH (ref 70–99)
Glucose-Capillary: 141 mg/dL — ABNORMAL HIGH (ref 70–99)
Glucose-Capillary: 135 mg/dL — ABNORMAL HIGH (ref 70–99)
Glucose-Capillary: 129 mg/dL — ABNORMAL HIGH (ref 70–99)
Glucose-Capillary: 177 mg/dL — ABNORMAL HIGH (ref 70–99)

## 2018-11-02 LAB — MAGNESIUM: Magnesium: 1.9 mg/dL (ref 1.7–2.4)

## 2018-11-02 LAB — POTASSIUM: Potassium: 3.9 mmol/L (ref 3.5–5.1)

## 2018-11-02 MED ORDER — POLYVINYL ALCOHOL 1.4 % OP SOLN
1.0000 [drp] | OPHTHALMIC | Status: DC | PRN
  Administered 2018-11-02 – 2018-11-05 (×4): 1 [drp] via OPHTHALMIC
  Filled 2018-11-02: qty 15

## 2018-11-02 MED ORDER — POTASSIUM CHLORIDE CRYS ER 20 MEQ PO TBCR
40.0000 meq | EXTENDED_RELEASE_TABLET | Freq: Once | ORAL | Status: AC
  Administered 2018-11-02: 07:00:00 40 meq via ORAL
  Filled 2018-11-02: qty 2

## 2018-11-02 MED ORDER — IPRATROPIUM-ALBUTEROL 0.5-2.5 (3) MG/3ML IN SOLN
3.0000 mL | Freq: Two times a day (BID) | RESPIRATORY_TRACT | Status: DC
  Administered 2018-11-02 – 2018-11-03 (×2): 3 mL via RESPIRATORY_TRACT
  Filled 2018-11-02 (×3): qty 3

## 2018-11-02 MED ORDER — FUROSEMIDE 10 MG/ML IJ SOLN
80.0000 mg | Freq: Once | INTRAMUSCULAR | Status: AC
  Administered 2018-11-02: 80 mg via INTRAVENOUS
  Filled 2018-11-02: qty 8

## 2018-11-02 NOTE — Progress Notes (Signed)
[facility]    NEPHROLOGY PROGRESS NOTE  SUBJECTIVE:  She had 4.1 liters urine output on 6/26.  Patient has been on a lasix gtt at 10 mg/hr.  Last metolazone on 6/23.  She hasn't required pressors overnight or yesterday.  Still feels swollen but better.  Review of systems:  Shortness of breath improving; still with dyspnea on exertion  Denies cp Nausea without vomiting  Walked down the hall yesterday - this was pretty difficult --------------------- Background on consult:  The patient is a 68 y.o. year old female patient with a past medical history significant for type 2 diabetes for more than 10 years, hyperlipidemia, hypertension, COPD, and breast cancer who presented as a transfer from [place] due to concerns for worsening renal function.  She was noted to have moderate to severe pulmonary hypertension with diastolic dysfunction.  Per the hospitalist at [facility], they were having difficulty diuresing her with worsening renal function with increasing doses of diuretics without improvement in urine output.  The patient complains of shortness of breath that has been ongoing for several weeks.  She reports she has had a significant amount of fluid that she has been having difficulty getting rid of.  The chart documents a 50 pound weight gain.  Renal consultation has been called for acute kidney injury, and chronic kidney disease, and difficulty with diuresis.  Note TTE with EF 60-65%, restricted filling; right vent systolic pressure 85 mm Hg.    OBJECTIVE:  Vitals:   11/02/18 0500 11/02/18 0600  BP: (!) 114/54 (!) 100/43  Pulse: 74 71  Resp: 18 20  Temp:    SpO2: 98% 100%    Intake/Output Summary (Last 24 hours) at 11/02/2018 6503 Last data filed at 11/02/2018 0300 Gross per 24 hour  Intake 581.13 ml  Output 4125 ml  Net -3543.87 ml      General:  Elderly female in bed in NAD  HEENT: MMM  AT anicteric sclera Neck: JVD+  CV:  Heart RRR ; no rub Lungs: reduced  anteriorly; unlabored at rest; on supplemental oxygen  Abd:  abd SNT/obese habitus/distended GU:  Foley in place Extremities: 1-2+ bilateral lower extremity edema - thighs; lower legs are wrapped   MEDICATIONS:  . amiodarone  200 mg Oral Daily  . aspirin EC  81 mg Oral Daily  . atorvastatin  20 mg Oral Daily  . Chlorhexidine Gluconate Cloth  6 each Topical Daily  . heparin  5,000 Units Subcutaneous Q8H  . insulin aspart  0-5 Units Subcutaneous QHS  . insulin aspart  0-9 Units Subcutaneous TID WC  . ipratropium-albuterol  3 mL Nebulization TID  . midodrine  10 mg Oral TID WC  . mometasone-formoterol  2 puff Inhalation BID       LABS:   CBC Latest Ref Rng & Units 11/01/2018 10/29/2018 10/28/2018  WBC 4.0 - 10.5 K/uL 4.0 5.8 8.9  Hemoglobin 12.0 - 15.0 g/dL 8.5(L) 9.1(L) 8.9(L)  Hematocrit 36.0 - 46.0 % 28.8(L) 30.7(L) 29.9(L)  Platelets 150 - 400 K/uL 163 208 209    CMP Latest Ref Rng & Units 11/01/2018 11/01/2018 10/31/2018  Glucose 70 - 99 mg/dL - 149(H) -  BUN 8 - 23 mg/dL - 69(H) -  Creatinine 0.44 - 1.00 mg/dL - 1.90(H) -  Sodium 135 - 145 mmol/L - 139 -  Potassium 3.5 - 5.1 mmol/L 3.4(L) 3.5 3.6  Chloride 98 - 111 mmol/L - 89(L) -  CO2 22 - 32 mmol/L - 39(H) -  Calcium 8.9 -  10.3 mg/dL - 8.8(L) -  Total Protein 6.5 - 8.1 g/dL - - -  Total Bilirubin 0.3 - 1.2 mg/dL - - -  Alkaline Phos 38 - 126 U/L - - -  AST 15 - 41 U/L - - -  ALT 0 - 44 U/L - - -    Lab Results  Component Value Date   CALCIUM 8.8 (L) 11/01/2018   PHOS 3.9 10/31/2018    No results found for: COLORURINE, APPEARANCEUR, LABSPEC, PHURINE, GLUCOSEU, HGBUR, BILIRUBINUR, KETONESUR, PROTEINUR, UROBILINOGEN, NITRITE, LEUKOCYTESUR    Component Value Date/Time   PHART 7.380 10/27/2018 2118   PCO2ART 47.5 10/27/2018 2118   PO2ART 79.2 (L) 10/27/2018 2118   HCO3 27.4 10/27/2018 2118   O2SAT 96.1 10/27/2018 2118       Component Value Date/Time   IRON 26 (L) 10/30/2018 0743   TIBC 344 10/30/2018 0743    FERRITIN 25 10/30/2018 0743   IRONPCTSAT 8 (L) 10/30/2018 0743       ASSESSMENT/PLAN:     1.  Acute kidney injury felt 2/2 cardiorenal.  She appears to have significant right-sided congestive heart failure.  An echocardiogram revealed significant pulmonary hypertension.  - Continues to improve with diuresis per last labs - Continue lasix gtt for now.   Will plan for transition to bolus lasix soon   - check K at 1600 and replete as needed given lasix gtt.  Potassium 40 meq once now  - Await BMP and mag.  2.  Chronic kidney disease stage III.  Her baseline serum creatinine runs around 1.27 from 06/01/2017.  2/2 DM and HTN  3.  Hypokalemia.  Hx resolved hyperkalemia and now repleting K   4.  Pulmonary hypertension.  Optimizing volume status with diuretics as above  5.  Diabetes mellitus. Per primary team   6. Normocytic anemia - no acute indication for transfusion.  Iron deficiency.  Feraheme x 1 on 6/25.  [name] 11/02/2018 6:47 AM

## 2018-11-02 NOTE — Progress Notes (Signed)
[name]  PROGRESS NOTE    Tammy Mathews  EXB:[id number] DOB: Jul 17, 1950 DOA: 10/27/2018 PCP: Greig Right, MD   Brief Narrative:   [name], transferred from another hospital to [facility] due to concerns for worsening renal function and shortness of breath. HX includes breast cancer, morbid obesity, hypertension, hyperlipidemia, diabetes mellitus, chronic kidney disease and COPD on 3 L of oxygen. Patient was reported to have left-sided chest pain, shortness of breath, dyspnea on exertion, orthopnea, leg edema and 50 pound weight gain in the last month.  Previous Echocardiogram reveals moderate to severe pulmonary hypertension with likely diastolic dysfunction.She was admitted to cardiology floor 6/21 but has gotten progressively worsening shortness of breath, needing BIPAP . Lasix drip initiated by nephrology and thus far very poor urine output.   Assessment & Plan:   Principal Problem:   Acute on chronic respiratory failure with hypoxia and hypercapnia (HCC) Active Problems:   Volume overload   AKI (acute kidney injury) ([place])   CKD (chronic kidney disease)   Chronic diastolic CHF (congestive heart failure) (HCC)   COPD (chronic obstructive pulmonary disease) (HCC)   Acute respiratory distress   Acute-on-chronic renal failure (HCC)   Palliative care by specialist   Goals of care, counseling/discussion   Acute on chronic diastolic heart failure Severe pulmonary hypertension Right sided heart failure, cor pulmonale Cardiogenic shock requiring vasopressor support     - now off pressors; BPs ok     - now off lasix gtt; appreciate nephro assistance; getting lasix 2m IV once  Cardiorenal syndrome AKI on CKD CKD stage III at baseline     - Continue IV diuresis as tolerated by patient; appreciate nephro direction  Electrolyte imbalances secondary to diuresis     - watch K+/Mg2+; maintain K+ grtr than 4 and Mg2+ grtr than 2     - BMP pending for 1600hrs,  follow, replete as necessary  Acute on chronic hypoxemic hypercarbic respiratory failure     - CPAP/BiPAP at night; O2 support during the day; wean as able  Diabetes type 2     - CBGs with SSI     - glucose acceptable  Vertigo     - meclazine  Normocytic anemia     - stable, no frank bleed noted, monitor     - iron labs noted  Morbid obesity     - diet/exercise as able  Debility     - PT consult recs SNF  Chronic periodontitis with severe bone loss Generalized gingival recession Supra eruption and drifting of the unopposed teeth into the edentulous areas     - eval'd by dental team; patient has denied surgical correction at this time  COPD     - home regimen: pulmicort/albuterol     - continue dulera, duoneb   DVT prophylaxis: heparin Code Status: FULL   Disposition Plan: TBD   Consultants:   Nephrology   Subjective: "My eyes are dry"  Objective: Vitals:   11/02/18 1136 11/02/18 1200 11/02/18 1300 11/02/18 1400  BP:  (!) 125/59  (!) 108/56  Pulse:  79 76 62  Resp:  _0 Temp: 97.9 F (36.6 C)     TempSrc: Oral     SpO2:  98% 100% 99%  Weight:      Height:        Intake/Output Summary (Last 24 hours) at 11/02/2018 1523 Last data filed at 11/02/2018 1400 Gross per 24 hour  Intake 535.04 ml  Output 3250 ml  Net -2714.96 ml   Filed Weights   10/31/18 0500 11/01/18 0411 11/02/18 0500  Weight: 127.9 kg 124.6 kg 120.3 kg    Examination:  General: 68 y.o. female resting in bed in NAD Cardiovascular: RRR, +S1, S2, no m/g/r, equal pulses throughout Respiratory: CTABL, no w/r/r, shallow breath but she appears comfortable, decreased sounds at bases GI: BS+, NDNT, no masses noted, no organomegaly noted, morbidly obese MSK: No c/c; BLE edema Skin: No rashes, bruises, ulcerations noted Neuro: A&O x 3, no focal deficits    Data Reviewed: I have personally reviewed following labs and imaging studies.  CBC: Recent Labs  Lab 10/27/18  2210 10/28/18 0631 10/29/18 0658 11/01/18 0710  WBC 8.2 8.9 5.8 4.0  NEUTROABS 7.2  --   --   --   HGB 8.7* 8.9* 9.1* 8.5*  HCT 30.1* 29.9* 30.7* 28.8*  MCV 90.9 87.9 88.2 88.3  PLT 196 209 208 413   Basic Metabolic Panel: Recent Labs  Lab 10/27/18 2249 10/28/18 0631  10/29/18 0658 10/29/18 1437 10/30/18 0311  10/31/18 0253 10/31/18 1421 11/01/18 0710 11/01/18 1909 11/02/18 0703  NA 135 138   < > 140 139 139  --  140  --  139  --  142  K 5.0 5.0   < > 3.9 3.9 4.0   < > 3.6 3.6 3.5 3.4* 3.5  CL 98 100   < > 98 99 97*  --  94*  --  89*  --  85*  CO2 26 26   < > _0 --  33*  --  39*  --  44*  GLUCOSE 182* 172*   < > 149* 165* 164*  --  167*  --  149*  --  145*  BUN 95* 97*   < > 89* 89* 86*  --  79*  --  69*  --  59*  CREATININE 3.65* 3.71*   < > 2.97* 3.27* 2.56*  --  2.05*  --  1.90*  --  1.78*  CALCIUM 8.2* 8.4*   < > 8.6* 8.6* 8.8*  --  8.8*  --  8.8*  --  9.0  MG  --  2.9*  --  2.6*  --  2.7*  --  2.3  --   --   --  1.9  PHOS 6.0* 5.8*  --  4.9*  --  4.7*  --  3.9  --   --   --   --    < > = values in this interval not displayed.   GFR: Estimated Creatinine Clearance: 36.2 mL/min (A) (by C-G formula based on SCr of 1.78 mg/dL (H)). Liver Function Tests: Recent Labs  Lab 10/27/18 1227 10/27/18 2249 10/28/18 0631  AST 40  --   --   ALT 17  --   --   ALKPHOS 186*  --   --   BILITOT 0.8  --   --   PROT 7.2  --   --   ALBUMIN 2.8* 3.1* 3.2*   No results for input(s): LIPASE, AMYLASE in the last 168 hours. No results for input(s): AMMONIA in the last 168 hours. Coagulation Profile: No results for input(s): INR, PROTIME in the last 168 hours. Cardiac Enzymes: No results for input(s): CKTOTAL, CKMB, CKMBINDEX, TROPONINI in the last 168 hours. BNP (last 3 results) No results for input(s): PROBNP in the last 8760 hours. HbA1C: No results for input(s): HGBA1C in the last 72 hours. CBG: Recent Labs  Lab 11/01/18 1543 11/01/18 2211 11/02/18 0702 11/02/18  0730 11/02/18 1134  GLUCAP 169* 148* 144* 141* 135*   Lipid Profile: No results for input(s): CHOL, HDL, LDLCALC, TRIG, CHOLHDL, LDLDIRECT in the last 72 hours. Thyroid Function Tests: No results for input(s): TSH, T4TOTAL, FREET4, T3FREE, THYROIDAB in the last 72 hours. Anemia Panel: No results for input(s): VITAMINB12, FOLATE, FERRITIN, TIBC, IRON, RETICCTPCT in the last 72 hours. Sepsis Labs: Recent Labs  Lab 10/27/18 2249  PROCALCITON 0.27    Recent Results (from the past 240 hour(s))  SARS Coronavirus 2 (CEPHEID- Performed in [place] hospital lab), Hosp Order     Status: None   Collection Time: 10/27/18  4:05 AM   Specimen: Nasopharyngeal Swab  Result Value Ref Range Status   SARS Coronavirus 2 NEGATIVE NEGATIVE Final    Comment: (NOTE) If result is NEGATIVE SARS-CoV-2 target nucleic acids are NOT DETECTED. The SARS-CoV-2 RNA is generally detectable in upper and lower  respiratory specimens during the acute phase of infection. The lowest  concentration of SARS-CoV-2 viral copies this assay can detect is 250  copies / mL. A negative result does not preclude SARS-CoV-2 infection  and should not be used as the sole basis for treatment or other  patient management decisions.  A negative result may occur with  improper specimen collection / handling, submission of specimen other  than nasopharyngeal swab, presence of viral mutation(s) within the  areas targeted by this assay, and inadequate number of viral copies  (<250 copies / mL). A negative result must be combined with clinical  observations, patient history, and epidemiological information. If result is POSITIVE SARS-CoV-2 target nucleic acids are DETECTED. The SARS-CoV-2 RNA is generally detectable in upper and lower  respiratory specimens dur ing the acute phase of infection.  Positive  results are indicative of active infection with SARS-CoV-2.  Clinical  correlation with patient history and other diagnostic  information is  necessary to determine patient infection status.  Positive results do  not rule out bacterial infection or co-infection with other viruses. If result is PRESUMPTIVE POSTIVE SARS-CoV-2 nucleic acids MAY BE PRESENT.   A presumptive positive result was obtained on the submitted specimen  and confirmed on repeat testing.  While 2019 novel coronavirus  (SARS-CoV-2) nucleic acids may be present in the submitted sample  additional confirmatory testing may be necessary for epidemiological  and / or clinical management purposes  to differentiate between  SARS-CoV-2 and other Sarbecovirus currently known to infect humans.  If clinically indicated additional testing with an alternate test  methodology [id number]) is advised. The SARS-CoV-2 RNA is generally  detectable in upper and lower respiratory sp ecimens during the acute  phase of infection. The expected result is Negative. Fact Sheet for Patients:  StrictlyIdeas.no Fact Sheet for Healthcare Providers: BankingDealers.co.za This test is not yet approved or cleared by the [place] FDA and has been authorized for detection and/or diagnosis of SARS-CoV-2 by FDA under an Emergency Use Authorization (EUA).  This EUA will remain in effect (meaning this test can be used) for the duration of the COVID-19 declaration under Section 564(b)(1) of the Act, 21 U.S.C. section 360bbb-3(b)(1), unless the authorization is terminated or revoked sooner. Performed at [facility], [place] [address], [place], [place] [address]   MRSA PCR Screening     Status: Abnormal   Collection Time: 10/28/18 12:08 AM   Specimen: Nasopharyngeal  Result Value Ref Range Status   MRSA by PCR POSITIVE (A) NEGATIVE Final  Comment:        The GeneXpert MRSA Assay (FDA approved for NASAL specimens only), is one component of a comprehensive MRSA colonization surveillance program. It is not intended to  diagnose MRSA infection nor to guide or monitor treatment for MRSA infections. MCDUFFIE,E RN [phone] 10/28/2018 MITCHELL,L Performed at [facility] [address], [place], [place] [address]       Radiology Studies: No results found.   Scheduled Meds: . amiodarone  200 mg Oral Daily  . aspirin EC  81 mg Oral Daily  . atorvastatin  20 mg Oral Daily  . Chlorhexidine Gluconate Cloth  6 each Topical Daily  . furosemide  80 mg Intravenous Once  . heparin  5,000 Units Subcutaneous Q8H  . insulin aspart  0-5 Units Subcutaneous QHS  . insulin aspart  0-9 Units Subcutaneous TID WC  . ipratropium-albuterol  3 mL Nebulization BID  . midodrine  10 mg Oral TID WC  . mometasone-formoterol  2 puff Inhalation BID   Continuous Infusions: . sodium chloride Stopped (10/31/18 0830)  . norepinephrine (LEVOPHED) Adult infusion Stopped (10/31/18 0826)     LOS: 6 days    Time spent: 25 minutes spent in the coordination of care.    Jonnie Finner, DO [facility] Pager [phone]  If 7PM-7AM, please contact night-coverage [email or web address] Password [facility] 11/02/2018, 3:23 PM

## 2018-11-02 NOTE — Progress Notes (Signed)
Orthopedic Tech Progress Note Patient Details:  Tammy Mathews 02/19/1951 [id number]  Ortho Devices Type of Ortho Device: Ace wrap, Unna boot Ortho Device/Splint Location: Removed unna boots, washed legs and applied Bilateral unna boots Ortho Device/Splint Interventions: Application   Post Interventions Patient Tolerated: Well Instructions Provided: Care of device   [place] Pink 11/02/2018, 5:47 PM

## 2018-11-03 ENCOUNTER — Other Ambulatory Visit: Payer: Self-pay

## 2018-11-03 LAB — URINALYSIS, ROUTINE W REFLEX MICROSCOPIC
Bilirubin Urine: NEGATIVE
Glucose, UA: NEGATIVE mg/dL
Ketones, ur: NEGATIVE mg/dL
Nitrite: NEGATIVE
Protein, ur: 100 mg/dL — AB
Specific Gravity, Urine: 1.009 (ref 1.005–1.030)
WBC, UA: >50 WBC/hpf — ABNORMAL HIGH (ref 0–5)
pH: 8 (ref 5.0–8.0)

## 2018-11-03 LAB — GLUCOSE, CAPILLARY
Glucose-Capillary: 138 mg/dL — ABNORMAL HIGH (ref 70–99)
Glucose-Capillary: 141 mg/dL — ABNORMAL HIGH (ref 70–99)
Glucose-Capillary: 151 mg/dL — ABNORMAL HIGH (ref 70–99)
Glucose-Capillary: 166 mg/dL — ABNORMAL HIGH (ref 70–99)
Glucose-Capillary: 185 mg/dL — ABNORMAL HIGH (ref 70–99)

## 2018-11-03 LAB — BASIC METABOLIC PANEL
Anion gap: 11 (ref 5–15)
BUN: 51 mg/dL — ABNORMAL HIGH (ref 8–23)
CO2: 45 mmol/L — ABNORMAL HIGH (ref 22–32)
Calcium: 9 mg/dL (ref 8.9–10.3)
Chloride: 84 mmol/L — ABNORMAL LOW (ref 98–111)
Creatinine, Ser: 1.59 mg/dL — ABNORMAL HIGH (ref 0.44–1.00)
GFR calc Af Amer: 38 mL/min — ABNORMAL LOW (ref >60)
GFR calc non Af Amer: 33 mL/min — ABNORMAL LOW (ref >60)
Glucose, Bld: 132 mg/dL — ABNORMAL HIGH (ref 70–99)
Potassium: 3.7 mmol/L (ref 3.5–5.1)
Sodium: 140 mmol/L (ref 135–145)

## 2018-11-03 LAB — MAGNESIUM: Magnesium: 1.8 mg/dL (ref 1.7–2.4)

## 2018-11-03 LAB — PROTEIN / CREATININE RATIO, URINE
Creatinine, Urine: 27.97 mg/dL
Protein Creatinine Ratio: 1.61 mg/mg{Cre} — ABNORMAL HIGH (ref 0.00–0.15)
Total Protein, Urine: 45 mg/dL

## 2018-11-03 MED ORDER — POTASSIUM CHLORIDE CRYS ER 20 MEQ PO TBCR
20.0000 meq | EXTENDED_RELEASE_TABLET | Freq: Once | ORAL | Status: AC
  Administered 2018-11-03: 20 meq via ORAL
  Filled 2018-11-03: qty 1

## 2018-11-03 MED ORDER — ORAL CARE MOUTH RINSE
15.0000 mL | Freq: Two times a day (BID) | OROMUCOSAL | Status: DC
  Administered 2018-11-03 – 2018-11-08 (×8): 15 mL via OROMUCOSAL

## 2018-11-03 MED ORDER — IPRATROPIUM-ALBUTEROL 0.5-2.5 (3) MG/3ML IN SOLN: 3.0000 mL | Freq: Two times a day (BID) | RESPIRATORY_TRACT | Status: DC | PRN

## 2018-11-03 MED ORDER — ACETAZOLAMIDE SODIUM 500 MG IJ SOLR
250.0000 mg | Freq: Once | INTRAMUSCULAR | Status: AC
  Administered 2018-11-03: 15:00:00 250 mg via INTRAVENOUS
  Filled 2018-11-03: qty 250

## 2018-11-03 MED ORDER — FUROSEMIDE 10 MG/ML IJ SOLN: 80.0000 mg | Freq: Once | INTRAMUSCULAR | Status: DC

## 2018-11-03 NOTE — Progress Notes (Signed)
Tammy Mathews Kitchen  PROGRESS NOTE    Tammy Mathews  TDV:[id number] DOB: [birth year] DOA: 10/27/2018 PCP: Greig Right, MD   Brief Narrative:   [name], transferred from another hospital to [facility] due to concerns for worsening renal function and shortness of breath. HX includes breast cancer, morbid obesity, hypertension, hyperlipidemia, diabetes mellitus, chronic kidney disease and COPD on 3 L of oxygen. Patient was reported to have left-sided chest pain, shortness of breath, dyspnea on exertion, orthopnea, leg edema and 50 pound weight gain in the last month.  Previous Echocardiogram reveals moderate to severe pulmonary hypertension with likely diastolic dysfunction.She was admitted to cardiology floor 6/21 but has gotten progressively worsening shortness of breath, needing BIPAP . Lasix drip initiated by nephrology and thus far very poor urine output.   Assessment & Plan:   Principal Problem:   Acute on chronic respiratory failure with hypoxia and hypercapnia (HCC) Active Problems:   Volume overload   AKI (acute kidney injury) ([facility])   CKD (chronic kidney disease)   Chronic diastolic CHF (congestive heart failure) (HCC)   COPD (chronic obstructive pulmonary disease) (HCC)   Acute respiratory distress   Acute-on-chronic renal failure (HCC)   Palliative care by specialist   Goals of care, counseling/discussion   Acute on chronic diastolic heart failure Severe pulmonary hypertension Right sided heart failure, cor pulmonale Cardiogenic shock requiring vasopressor support - now off pressors; BPs ok - now off lasix gtt; appreciate nephro assistance; getting lasix 52m IV once  Cardiorenal syndrome AKI on CKD CKD stage III at baseline - Continue IV diuresis as tolerated by patient; appreciate nephro direction  Electrolyte imbalances secondary to diuresis - watch K+/Mg2+; maintain K+ grtr than 4 and Mg2+ grtr than 2     - BMP pending for 1600hrs,  follow, replete as necessary     - let's get her a dose of K+ today; check Mg2+  Acute on chronic hypoxemic hypercarbic respiratory failure - CPAP/BiPAP at night; O2 support during the day; wean as able  Diabetes type 2 - CBGs with SSI     - glucose acceptable  Vertigo - meclazine  Normocytic anemia - stable, no frank bleed noted, monitor - iron labs noted  Morbid obesity - diet/exercise as able  Debility - PT consult recs SNF  Chronic periodontitis with severe bone loss Generalized gingival recession Supra eruption and drifting of the unopposed teeth into the edentulous areas - eval'd by dental team; patient has denied surgical correction at this time     - per dental team: The patient may be willing to proceed with [place].  The patient will then follow-up with the general dentist of her choice for fabrication of upper and lower complete dentures after adequate healing.  Patient is aware that due to her significant atrophy of the edentulous alveolar ridges, the prognosis for successful upper and lower complete denture fabrication is significantly limited.  Patient is aware of implant options but has limited economic resources at this time.  Patient is aware that Medicaid should cover the cost associated with the dental extraction procedures with an oral surgeon.  I will assist the patient in follow-up with the oral surgeon if so instructed by the patient.  COPD - home regimen: pulmicort/albuterol - continue dulera, duoneb  Her diet is restricted. No history of dysphagia. Maybe restricted d/t dentition? Will ask SLP to eval; liberalize as able. She's good  for progressive care.  DVT prophylaxis:heparin Code Status:FULL Disposition Plan:TBD   Consultants:   Nephrology   Subjective: "I want oatmeal"   Objective: Vitals:   11/03/18 0700 11/03/18 0737 11/03/18 0814 11/03/18 0816  BP: 122/61     Pulse: 77     Resp: (!) 26     Temp:  97.9 F (36.6 C)    TempSrc:  Oral    SpO2: 96%  100% 96%  Weight:      Height:        Intake/Output Summary (Last 24 hours) at 11/03/2018 0939 Last data filed at 11/03/2018 0700 Gross per 24 hour  Intake 840 ml  Output 3150 ml  Net -2310 ml   Filed Weights   10/31/18 0500 11/01/18 0411 11/02/18 0500  Weight: 127.9 kg 124.6 kg 120.3 kg    Examination:  General:68 y.o.femaleresting in bed in NAD Cardiovascular: RRR, +S1, S2, no m/g/r, equal pulses throughout Respiratory: CTABL, no w/r/r,shallow breath but she appears comfortable, decreased sounds at bases GI: BS+, NDNT, no masses noted, no organomegaly noted, morbidly obese MSK: No c/c; BLE edema Skin: No rashes, bruises, ulcerations noted Neuro: A&O x 3, no focal deficits    Data Reviewed: I have personally reviewed following labs and imaging studies.  CBC: Recent Labs  Lab 10/27/18 2210 10/28/18 0631 10/29/18 0658 11/01/18 0710  WBC 8.2 8.9 5.8 4.0  NEUTROABS 7.2  --   --   --   HGB 8.7* 8.9* 9.1* 8.5*  HCT 30.1* 29.9* 30.7* 28.8*  MCV 90.9 87.9 88.2 88.3  PLT 196 209 208 237   Basic Metabolic Panel: Recent Labs  Lab 10/27/18 2249 10/28/18 0631  10/29/18 0658  10/30/18 0311  10/31/18 0253  11/01/18 0710 11/01/18 1909 11/02/18 0703 11/02/18 1622 11/03/18 0700  NA 135 138   < > 140   < > 139  --  140  --  139  --  142  --  140  K 5.0 5.0   < > 3.9   < > 4.0   < > 3.6   < > 3.5 3.4* 3.5 3.9 3.7  CL 98 100   < > 98   < > 97*  --  94*  --  89*  --  85*  --  84*  CO2 26 26   < > 29   < > 31  --  33*  --  39*  --  44*  --  45*  GLUCOSE 182* 172*   < > 149*   < > 164*  --  167*  --  149*  --  145*  --  132*  BUN 95* 97*   < > 89*   < > 86*  --  79*  --  69*  --  59*  --  51*  CREATININE 3.65* 3.71*   < > 2.97*   < > 2.56*  --  2.05*  --  1.90*  --  1.78*  --  1.59*   CALCIUM 8.2* 8.4*   < > 8.6*   < > 8.8*  --  8.8*  --  8.8*  --  9.0  --  9.0  MG  --  2.9*  --  2.6*  --  2.7*  --  2.3  --   --   --  1.9  --   --   PHOS 6.0* 5.8*  --  4.9*  --  4.7*  --  3.9  --   --   --   --   --   --    < > =  values in this interval not displayed.   GFR: Estimated Creatinine Clearance: 40.5 mL/min (A) (by C-G formula based on SCr of 1.59 mg/dL (H)). Liver Function Tests: Recent Labs  Lab 10/27/18 1227 10/27/18 2249 10/28/18 0631  AST 40  --   --   ALT 17  --   --   ALKPHOS 186*  --   --   BILITOT 0.8  --   --   PROT 7.2  --   --   ALBUMIN 2.8* 3.1* 3.2*   No results for input(s): LIPASE, AMYLASE in the last 168 hours. No results for input(s): AMMONIA in the last 168 hours. Coagulation Profile: No results for input(s): INR, PROTIME in the last 168 hours. Cardiac Enzymes: No results for input(s): CKTOTAL, CKMB, CKMBINDEX, TROPONINI in the last 168 hours. BNP (last 3 results) No results for input(s): PROBNP in the last 8760 hours. HbA1C: No results for input(s): HGBA1C in the last 72 hours. CBG: Recent Labs  Lab 11/02/18 1134 11/02/18 1629 11/02/18 2115 11/03/18 0649 11/03/18 0737  GLUCAP 135* 129* 177* 138* 141*   Lipid Profile: No results for input(s): CHOL, HDL, LDLCALC, TRIG, CHOLHDL, LDLDIRECT in the last 72 hours. Thyroid Function Tests: No results for input(s): TSH, T4TOTAL, FREET4, T3FREE, THYROIDAB in the last 72 hours. Anemia Panel: No results for input(s): VITAMINB12, FOLATE, FERRITIN, TIBC, IRON, RETICCTPCT in the last 72 hours. Sepsis Labs: Recent Labs  Lab 10/27/18 2249  PROCALCITON 0.27    Recent Results (from the past 240 hour(s))  SARS Coronavirus 2 (CEPHEID- Performed in [facility] hospital lab), Hosp Order     Status: None   Collection Time: 10/27/18  4:05 AM   Specimen: Nasopharyngeal Swab  Result Value Ref Range Status   SARS Coronavirus 2 NEGATIVE NEGATIVE Final    Comment: (NOTE) If result is NEGATIVE  SARS-CoV-2 target nucleic acids are NOT DETECTED. The SARS-CoV-2 RNA is generally detectable in upper and lower  respiratory specimens during the acute phase of infection. The lowest  concentration of SARS-CoV-2 viral copies this assay can detect is 250  copies / mL. A negative result does not preclude SARS-CoV-2 infection  and should not be used as the sole basis for treatment or other  patient management decisions.  A negative result may occur with  improper specimen collection / handling, submission of specimen other  than nasopharyngeal swab, presence of viral mutation(s) within the  areas targeted by this assay, and inadequate number of viral copies  (<250 copies / mL). A negative result must be combined with clinical  observations, patient history, and epidemiological information. If result is POSITIVE SARS-CoV-2 target nucleic acids are DETECTED. The SARS-CoV-2 RNA is generally detectable in upper and lower  respiratory specimens dur ing the acute phase of infection.  Positive  results are indicative of active infection with SARS-CoV-2.  Clinical  correlation with patient history and other diagnostic information is  necessary to determine patient infection status.  Positive results do  not rule out bacterial infection or co-infection with other viruses. If result is PRESUMPTIVE POSTIVE SARS-CoV-2 nucleic acids MAY BE PRESENT.   A presumptive positive result was obtained on the submitted specimen  and confirmed on repeat testing.  While 2019 novel coronavirus  (SARS-CoV-2) nucleic acids may be present in the submitted sample  additional confirmatory testing may be necessary for epidemiological  and / or clinical management purposes  to differentiate between  SARS-CoV-2 and other Sarbecovirus currently known to infect humans.  If clinically indicated additional  testing with an alternate test  methodology [phone]) is advised. The SARS-CoV-2 RNA is generally  detectable in upper  and lower respiratory sp ecimens during the acute  phase of infection. The expected result is Negative. Fact Sheet for Patients:  StrictlyIdeas.no Fact Sheet for Healthcare Providers: BankingDealers.co.za This test is not yet approved or cleared by the [place] FDA and has been authorized for detection and/or diagnosis of SARS-CoV-2 by FDA under an Emergency Use Authorization (EUA).  This EUA will remain in effect (meaning this test can be used) for the duration of the COVID-19 declaration under Section 564(b)(1) of the Act, 21 U.S.C. section 360bbb-3(b)(1), unless the authorization is terminated or revoked sooner. Performed at [facility], [place] [address] [place], [place] [address]   MRSA PCR Screening     Status: Abnormal   Collection Time: 10/28/18 12:08 AM   Specimen: Nasopharyngeal  Result Value Ref Range Status   MRSA by PCR POSITIVE (A) NEGATIVE Final    Comment:        The GeneXpert MRSA Assay (FDA approved for NASAL specimens only), is one component of a comprehensive MRSA colonization surveillance program. It is not intended to diagnose MRSA infection nor to guide or monitor treatment for MRSA infections. MCDUFFIE,E RN [phone] 10/28/2018 MITCHELL,L Performed at [facility] [address], [place], [place] [address]          Radiology Studies: No results found.      Scheduled Meds: . amiodarone  200 mg Oral Daily  . aspirin EC  81 mg Oral Daily  . atorvastatin  20 mg Oral Daily  . Chlorhexidine Gluconate Cloth  6 each Topical Daily  . heparin  5,000 Units Subcutaneous Q8H  . insulin aspart  0-5 Units Subcutaneous QHS  . insulin aspart  0-9 Units Subcutaneous TID WC  . ipratropium-albuterol  3 mL Nebulization BID  . midodrine  10 mg Oral TID WC  . mometasone-formoterol  2 puff Inhalation BID   Continuous Infusions: . sodium chloride Stopped (10/31/18 0830)  . norepinephrine (LEVOPHED)  Adult infusion Stopped (10/31/18 0826)     LOS: 7 days    Time spent: 35 minutes spent in the coordination of care today.    Jonnie Finner, DO [facility] Pager [phone]  If 7PM-7AM, please contact night-coverage [email or web address] Password [facility] 11/03/2018, 9:39 AM

## 2018-11-03 NOTE — Progress Notes (Signed)
SLP Cancellation Note  Patient Details Name: Tammy Mathews MRN: [id number] DOB: 1951/05/02   Cancelled treatment:        Orders for clinical swallow evaluation received and appreciated.  Attempted to evaluated patient.  Held per RN. CSE may not be indicated at this time.  Per pt description she is at baseline function, with no pharyngeal indicators, and oral issues related to dental status. Offered to assess oral phase. RN deferred evaluation pending conversation with MD.   Celedonio Savage, [place], [place] Office: [phone]; Pager (6/28): [phone] 11/03/2018, 11:03 AM

## 2018-11-03 NOTE — Progress Notes (Signed)
[facility]    NEPHROLOGY PROGRESS NOTE  SUBJECTIVE:  She had 4 liters urine output on 6/27.  Transitioned from lasix gtt to bolus lasix on 6/27.  Labs today just drawn.  She has declined the cream of chicken soup they sent for breakfast and the sweet tea - she cites her health as reasons and I let her know we appreciate her insight as to this  Review of systems Shortness of breath improving; still with dyspnea on exertion  Denies cp Walked down the hall a couple of days ago but not yesterday  --------------------- Background on consult:  The patient is a 68 y.o. year old female patient with a past medical history significant for type 2 diabetes for more than 10 years, hyperlipidemia, hypertension, COPD, and breast cancer who presented as a transfer from [place] due to concerns for worsening renal function.  She was noted to have moderate to severe pulmonary hypertension with diastolic dysfunction.  Per the hospitalist at [facility], they were having difficulty diuresing her with worsening renal function with increasing doses of diuretics without improvement in urine output.  The patient complains of shortness of breath that has been ongoing for several weeks.  She reports she has had a significant amount of fluid that she has been having difficulty getting rid of.  The chart documents a 50 pound weight gain.  Renal consultation has been called for acute kidney injury, and chronic kidney disease, and difficulty with diuresis.  Note TTE with EF 60-65%, restricted filling; right vent systolic pressure 85 mm Hg.    OBJECTIVE:  Vitals:   11/03/18 0500 11/03/18 0600  BP: (!) 99/47 (!) 120/55  Pulse: 66 72  Resp: 18 17  Temp:    SpO2: 99% 98%    Intake/Output Summary (Last 24 hours) at 11/03/2018 7510 Last data filed at 11/03/2018 0600 Gross per 24 hour  Intake 898.18 ml  Output 3950 ml  Net -3051.82 ml      General:  Elderly female in bed in NAD HEENT: MMM [place] AT anicteric  sclera Neck: JVD+  CV:  S1S2; no rub Lungs: reduced anteriorly; unlabored at rest; on supplemental oxygen  Abd:  abd SNT/obese habitus/distended GU:  Foley in place Extremities: 1-2+ bilateral lower extremity edema - thighs; lower legs are wrapped   MEDICATIONS:  . amiodarone  200 mg Oral Daily  . aspirin EC  81 mg Oral Daily  . atorvastatin  20 mg Oral Daily  . Chlorhexidine Gluconate Cloth  6 each Topical Daily  . heparin  5,000 Units Subcutaneous Q8H  . insulin aspart  0-5 Units Subcutaneous QHS  . insulin aspart  0-9 Units Subcutaneous TID WC  . ipratropium-albuterol  3 mL Nebulization BID  . midodrine  10 mg Oral TID WC  . mometasone-formoterol  2 puff Inhalation BID       LABS:   CBC Latest Ref Rng & Units 11/01/2018 10/29/2018 10/28/2018  WBC 4.0 - 10.5 K/uL 4.0 5.8 8.9  Hemoglobin 12.0 - 15.0 g/dL 8.5(L) 9.1(L) 8.9(L)  Hematocrit 36.0 - 46.0 % 28.8(L) 30.7(L) 29.9(L)  Platelets 150 - 400 K/uL 163 208 209    CMP Latest Ref Rng & Units 11/02/2018 11/02/2018 11/01/2018  Glucose 70 - 99 mg/dL - 145(H) -  BUN 8 - 23 mg/dL - 59(H) -  Creatinine 0.44 - 1.00 mg/dL - 1.78(H) -  Sodium 135 - 145 mmol/L - 142 -  Potassium 3.5 - 5.1 mmol/L 3.9 3.5 3.4(L)  Chloride 98 - 111  mmol/L - 85(L) -  CO2 22 - 32 mmol/L - 44(H) -  Calcium 8.9 - 10.3 mg/dL - 9.0 -  Total Protein 6.5 - 8.1 g/dL - - -  Total Bilirubin 0.3 - 1.2 mg/dL - - -  Alkaline Phos 38 - 126 U/L - - -  AST 15 - 41 U/L - - -  ALT 0 - 44 U/L - - -    Lab Results  Component Value Date   CALCIUM 9.0 11/02/2018   PHOS 3.9 10/31/2018    No results found for: COLORURINE, APPEARANCEUR, LABSPEC, PHURINE, GLUCOSEU, HGBUR, BILIRUBINUR, KETONESUR, PROTEINUR, UROBILINOGEN, NITRITE, LEUKOCYTESUR    Component Value Date/Time   PHART 7.380 10/27/2018 2118   PCO2ART 47.5 10/27/2018 2118   PO2ART 79.2 (L) 10/27/2018 2118   HCO3 27.4 10/27/2018 2118   O2SAT 96.1 10/27/2018 2118       Component Value Date/Time   IRON 26 (L)  10/30/2018 0743   TIBC 344 10/30/2018 0743   FERRITIN 25 10/30/2018 0743   IRONPCTSAT 8 (L) 10/30/2018 0743       ASSESSMENT/PLAN:     1.  Acute kidney injury felt 2/2 cardiorenal.  She appears to have significant right-sided congestive heart failure.  An echocardiogram revealed significant pulmonary hypertension.  - Continues to improve with diuresis per last labs - Awaiting AM labs to guide diuretics - lasix vs diamox today  - Ok from a renal standpoint to move out of ICU  - Check UA and up/c ratio    2.  Chronic kidney disease stage III.  Her baseline serum creatinine runs around 1.27 from 06/01/2017.  2/2 DM and HTN  3.  Hypokalemia.  Hx resolved hyperkalemia and now repleting K PRN  4.  Pulmonary hypertension.  Optimizing volume status with diuretics as above  5.  Diabetes mellitus. Per primary team   6. Normocytic anemia - no acute indication for transfusion.  Iron deficiency.  Feraheme x 1 on 6/25.  Trend CBC  [name] 11/03/2018 7:13 AM

## 2018-11-03 NOTE — Progress Notes (Signed)
Patient refused use of cpap machine this evening. Will continue to monitor patient.

## 2018-11-03 NOTE — Plan of Care (Signed)

## 2018-11-03 NOTE — Consult Note (Signed)
WOC consulted for need for Interdry Ag+ Orders added Discussed with bedside nurse   Re consult if needed, will not follow at this time. Thanks  Lauri Till [employer], RN,CWOCN, CNS, [place] [phone])

## 2018-11-03 NOTE — Progress Notes (Addendum)
Pt got transferred from [facility] at 2.40Pm , vitals stable, oxygen contd _0 , pt walked to the bathroom with 2 person assisted, interdry provided for her abdominal MSAD, Unna boots CDI, pt is comfortable and denies pain this time, medicine diamox due at 1330, is provided at ICU verified with RN, Junie Panning.  Will continue to monitor the patient  Palma Holter, RN

## 2018-11-04 ENCOUNTER — Inpatient Hospital Stay (HOSPITAL_COMMUNITY): Payer: Medicare Other

## 2018-11-04 DIAGNOSIS — N170 Acute kidney failure with tubular necrosis: Secondary | ICD-10-CM

## 2018-11-04 LAB — GLUCOSE, CAPILLARY
Glucose-Capillary: 150 mg/dL — ABNORMAL HIGH (ref 70–99)
Glucose-Capillary: 202 mg/dL — ABNORMAL HIGH (ref 70–99)
Glucose-Capillary: 183 mg/dL — ABNORMAL HIGH (ref 70–99)
Glucose-Capillary: 189 mg/dL — ABNORMAL HIGH (ref 70–99)

## 2018-11-04 LAB — CBC
HCT: 29.8 % — ABNORMAL LOW (ref 36.0–46.0)
Hemoglobin: 8.7 g/dL — ABNORMAL LOW (ref 12.0–15.0)
MCH: 26 pg (ref 26.0–34.0)
MCHC: 29.2 g/dL — ABNORMAL LOW (ref 30.0–36.0)
MCV: 89.2 fL (ref 80.0–100.0)
Platelets: 147 10*3/uL — ABNORMAL LOW (ref 150–400)
RBC: 3.34 MIL/uL — ABNORMAL LOW (ref 3.87–5.11)
RDW: 15.9 % — ABNORMAL HIGH (ref 11.5–15.5)
WBC: 4.4 10*3/uL (ref 4.0–10.5)
nRBC: 0 % (ref 0.0–0.2)

## 2018-11-04 LAB — BASIC METABOLIC PANEL
Anion gap: 8 (ref 5–15)
BUN: 42 mg/dL — ABNORMAL HIGH (ref 8–23)
CO2: 42 mmol/L — ABNORMAL HIGH (ref 22–32)
Calcium: 8.8 mg/dL — ABNORMAL LOW (ref 8.9–10.3)
Chloride: 89 mmol/L — ABNORMAL LOW (ref 98–111)
Creatinine, Ser: 1.42 mg/dL — ABNORMAL HIGH (ref 0.44–1.00)
GFR calc Af Amer: 44 mL/min — ABNORMAL LOW (ref >60)
GFR calc non Af Amer: 38 mL/min — ABNORMAL LOW (ref >60)
Glucose, Bld: 169 mg/dL — ABNORMAL HIGH (ref 70–99)
Potassium: 3.6 mmol/L (ref 3.5–5.1)
Sodium: 139 mmol/L (ref 135–145)

## 2018-11-04 MED ORDER — POTASSIUM CHLORIDE CRYS ER 20 MEQ PO TBCR
20.0000 meq | EXTENDED_RELEASE_TABLET | Freq: Two times a day (BID) | ORAL | Status: DC
  Administered 2018-11-04 – 2018-11-08 (×9): 20 meq via ORAL
  Filled 2018-11-04 (×9): qty 1

## 2018-11-04 MED ORDER — SODIUM CHLORIDE 0.9% FLUSH
3.0000 mL | Freq: Two times a day (BID) | INTRAVENOUS | Status: DC
  Administered 2018-11-04 – 2018-11-07 (×7): 3 mL via INTRAVENOUS

## 2018-11-04 MED ORDER — TORSEMIDE 20 MG PO TABS
40.0000 mg | ORAL_TABLET | Freq: Every day | ORAL | Status: DC
  Administered 2018-11-04 – 2018-11-05 (×2): 40 mg via ORAL
  Filled 2018-11-04 (×2): qty 2

## 2018-11-04 MED ORDER — TECHNETIUM TO 99M ALBUMIN AGGREGATED
1.5000 | Freq: Once | INTRAVENOUS | Status: AC | PRN
  Administered 2018-11-04: 1.5 via INTRAVENOUS

## 2018-11-04 MED ORDER — ACETAZOLAMIDE 250 MG PO TABS
250.0000 mg | ORAL_TABLET | ORAL | Status: DC
  Administered 2018-11-04 – 2018-11-06 (×2): 250 mg via ORAL
  Filled 2018-11-04 (×3): qty 1

## 2018-11-04 NOTE — NC FL2 (Signed)
[place] LEVEL OF CARE SCREENING TOOL     IDENTIFICATION  Patient Name: Tammy Mathews Birthdate: 12-31-1950 Sex: female Admission Date (Current Location): 10/27/2018  [facility] and [place] Number:  [place] and Address:         Provider Number: [id number]  Attending Physician Name and Address:  Jonnie Finner, DO  Relative Name and Phone Number:       Current Level of Care: Hospital Recommended Level of Care: [place] Prior Approval Number:    Date Approved/Denied:   PASRR Number: [id number]  Discharge Plan: SNF    Current Diagnoses: Patient Active Problem List   Diagnosis Date Noted  . Palliative care by specialist   . Goals of care, counseling/discussion   . Acute on chronic respiratory failure with hypoxia and hypercapnia ([place]) 10/28/2018  . Acute-on-chronic renal failure ([place]) 10/28/2018  . Volume overload 10/27/2018  . AKI (acute kidney injury) ([place]) 10/27/2018  . CKD (chronic kidney disease) 10/27/2018  . Chronic diastolic CHF (congestive heart failure) ([place]) 10/27/2018  . COPD (chronic obstructive pulmonary disease) ([place]) 10/27/2018  . Acute respiratory distress   . Apnea 02/08/2018  . Arthritis 02/08/2018  . Constipation 02/08/2018  . Ductal carcinoma in situ (DCIS) of right breast 02/08/2018  . Sore on leg 02/08/2018  . Requires supplemental oxygen 02/08/2018  . Diastolic dysfunction 23/53/6144  . Tricuspid regurgitation 05/30/2017  . Dyslipidemia 04/20/2017  . Cor pulmonale (chronic) ([place]) 04/06/2017  . Asthma 04/06/2017  . Morbid obesity due to excess calories ([place]) 04/06/2017  . Chronic respiratory failure with hypoxia and hypercapnia ([place]) 04/06/2017  . Shortness of breath 04/05/2017  . HTN (hypertension) 04/05/2017  . Persistent atrial fibrillation 12/06/2016  . Pulmonary hypertension ([place]) 12/06/2016  . Post-op pain 11/29/2015  . Adult BMI 45.0-49.9 kg/sq m ([place]) 10/29/2015  . Carcinoma of left  breast metastatic to axillary lymph node ([place]) 10/29/2015  . Type 2 diabetes mellitus with complication ([place]) 31/54/0086  . H/O therapeutic radiation 10/29/2015  . OSA (obstructive sleep apnea) 10/29/2015  . Pulmonary emphysema ([place]) 10/29/2015    Orientation RESPIRATION BLADDER Height & Weight     Self, Time, Situation, Place  O2(Nasal Canula 3 L) Continent, External catheter Weight: 260 lb 2.3 oz (118 kg) Height:  _0  (154.9 cm)  BEHAVIORAL SYMPTOMS/MOOD NEUROLOGICAL BOWEL NUTRITION STATUS  (None)   Continent Diet(DYS 3.)  AMBULATORY STATUS COMMUNICATION OF NEEDS Skin   Limited Assist Verbally Bruising(Blister, MASD, Skin tear, Weeping.)                       Personal Care Assistance Level of Assistance  Bathing, Dressing, Feeding Bathing Assistance: Limited assistance Feeding assistance: Limited assistance Dressing Assistance: Limited assistance     Functional Limitations Info  Sight, Speech, Hearing Sight Info: Adequate Hearing Info: Adequate Speech Info: Adequate    SPECIAL CARE FACTORS FREQUENCY  PT (By licensed PT), Speech therapy     PT Frequency: 5 x week       Speech Therapy Frequency: 5 x week      Contractures Contractures Info: Not present    Additional Factors Info  Code Status, Allergies Code Status Info: Full code Allergies Info: Codeine, Morphine and related.           Current Medications (11/04/2018):  This is the current hospital active medication list Current Facility-Administered Medications  Medication Dose Route Frequency Provider Last Rate Last Dose  . 0.9 %  sodium chloride infusion  Intravenous PRN June Leap L, DO   Stopped at 10/31/18 0830  . acetaminophen (TYLENOL) tablet 650 mg  650 mg Oral Q6H PRN Shela Leff, MD   650 mg at 11/03/18 2102   Or  . acetaminophen (TYLENOL) suppository 650 mg  650 mg Rectal Q6H PRN Shela Leff, MD      . acetaZOLAMIDE (DIAMOX) tablet 250 mg  250 mg Oral Tana Coast, MD   250 mg at 11/04/18 0857  . amiodarone (PACERONE) tablet 200 mg  200 mg Oral Daily Roxanne Mins, MD   200 mg at 11/04/18 0854  . aspirin EC tablet 81 mg  81 mg Oral Daily Roxanne Mins, MD   81 mg at 11/04/18 0854  . atorvastatin (LIPITOR) tablet 20 mg  20 mg Oral Daily Roxanne Mins, MD   20 mg at 11/04/18 0854  . Chlorhexidine Gluconate Cloth 2 % PADS 6 each  6 each Topical Daily Roxanne Mins, MD   6 each at 11/04/18 0855  . heparin injection 5,000 Units  5,000 Units Subcutaneous Q8H Shela Leff, MD   5,000 Units at 11/04/18 0520  . insulin aspart (novoLOG) injection 0-5 Units  0-5 Units Subcutaneous QHS Shela Leff, MD      . insulin aspart (novoLOG) injection 0-9 Units  0-9 Units Subcutaneous TID WC Shela Leff, MD   3 Units at 11/04/18 1211  . ipratropium-albuterol (DUONEB) 0.5-2.5 (3) MG/3ML nebulizer solution 3 mL  3 mL Nebulization BID PRN Marylyn Ishihara, Tyrone A, DO      . meclizine (ANTIVERT) tablet 25 mg  25 mg Oral TID PRN Marylyn Ishihara, Tyrone A, DO   25 mg at 11/03/18 1223  . MEDLINE mouth rinse  15 mL Mouth Rinse BID Kyle, Tyrone A, DO   15 mL at 11/04/18 1000  . midodrine (PROAMATINE) tablet 10 mg  10 mg Oral TID WC Shela Leff, MD   10 mg at 11/04/18 1210  . mometasone-formoterol (DULERA) 100-5 MCG/ACT inhaler 2 puff  2 puff Inhalation BID Roxanne Mins, MD   2 puff at 11/04/18 [phone]  . norepinephrine (LEVOPHED) 80m in 2533mpremix infusion  0-40 mcg/min Intravenous Titrated RoRoxanne MinsMD   Stopped at 10/31/18 [phone]. ondansetron (ZOFRAN) tablet 4 mg  4 mg Oral Q8H PRN [place], Bradley L, DO   4 mg at 11/02/18 0852  . polyvinyl alcohol (LIQUIFILM TEARS) 1.4 % ophthalmic solution 1 drop  1 drop Both Eyes PRN KyMarylyn IshiharaTyrone A, DO   1 drop at 11/03/18 0925  . potassium chloride SA (K-DUR) CR tablet 20 mEq  20 mEq Oral BID KyMarylyn IshiharaTyrone A, DO   20 mEq at 11/04/18 1216  . sodium chloride flush (NS) 0.9 % injection 3 mL  3 mL Intravenous Q12H Clegg,  Amy D, NP      . torsemide (DEMADEX) tablet 40 mg  40 mg Oral Daily BhRosita FireMD   40 mg at 11/04/18 [id number]   Discharge Medications: Please see discharge summary for a list of discharge medications.  Relevant Imaging Results:  Relevant Lab Results:   Additional Information SS#: [id number] [id number]

## 2018-11-04 NOTE — Care Management Important Message (Signed)
Important Message  Patient Details  Name: Tammy Mathews MRN: [id number] Date of Birth: July 01, 1950   Medicare Important Message Given:  Yes     Memory Argue 11/04/2018, 3:34 PM

## 2018-11-04 NOTE — Clinical Social Work Note (Signed)
Went by room to discuss SNF placement but patient off unit for VQ scan. RN will notify CSW when she returns.  [name], [name]

## 2018-11-04 NOTE — Progress Notes (Signed)
Tammy Mathews  PROGRESS NOTE    Tammy Mathews  EPP:[id number] DOB: [birth year] DOA: 10/27/2018 PCP: Greig Right, MD   Brief Narrative:   [name], transferred from another hospital to [facility] due to concerns for worsening renal function and shortness of breath. HX includes breast cancer, morbid obesity, hypertension, hyperlipidemia, diabetes mellitus, chronic kidney disease and COPD on 3 L of oxygen. Patient was reported to have left-sided chest pain, shortness of breath, dyspnea on exertion, orthopnea, leg edema and 50 pound weight gain in the last month.  Previous Echocardiogram reveals moderate to severe pulmonary hypertension with likely diastolic dysfunction.She was admitted to cardiology floor 6/21 but has gotten progressively worsening shortness of breath, needing BIPAP . Lasix drip initiated by nephrology and thus far very poor urine output.   Assessment & Plan:   Principal Problem:   Acute on chronic respiratory failure with hypoxia and hypercapnia (HCC) Active Problems:   Volume overload   AKI (acute kidney injury) ([place])   CKD (chronic kidney disease)   Chronic diastolic CHF (congestive heart failure) (HCC)   COPD (chronic obstructive pulmonary disease) (HCC)   Acute respiratory distress   Acute-on-chronic renal failure (HCC)   Palliative care by specialist   Goals of care, counseling/discussion   Acute on chronic diastolic heart failure Severe pulmonary hypertension Right sided heart failure, cor pulmonale Cardiogenic shock requiring vasopressor support - now off pressors; BPs ok -now offlasix gtt; appreciate nephro assistance     - now on diamox/toresemide     - will consult cards for [place]  Cardiorenal syndrome AKI on CKD CKD stage III at baseline - Continue IV diuresis as tolerated by patient; appreciate nephro direction  Electrolyte imbalances secondary to diuresis - watch K+/Mg2+; maintain K+ grtr than 4 and Mg2+ grtr  than 2  Acute on chronic hypoxemic hypercarbic respiratory failure - CPAP/BiPAP at night; O2 support during the day; wean as able  Diabetes type 2 - CBGs with SSI - glucose acceptable  Vertigo - meclazine  Normocytic anemia - stable, no frank bleed noted, monitor - iron labs noted  Morbid obesity - diet/exercise as able  Debility - PT consult recs SNF  Chronic periodontitis with severe bone loss Generalized gingival recession Supra eruption and drifting of the unopposed teeth into the edentulous areas - eval'd by dental team; patient has denied surgical correction at this time     - per dental team: The patient may be willing to proceed with extractions with a local oral surgeon in [place], [place] once she is more medically stable after this admission. The patient will then follow-up with the general dentist of her choice for fabrication of upper and lower complete dentures after adequate healing. Patient is aware that due to her significant atrophy of the edentulous alveolar ridges, the prognosis for successful upper and lower complete denture fabrication is significantly limited. Patient is aware of implant options but has limited economic resources at this time. Patient is aware that Medicaid should cover the cost associated with the dental extraction procedures with an oral surgeon.I will assist the patient in follow-up with the oral surgeon if so instructed by the patient.  COPD - home regimen: pulmicort/albuterol - continue dulera, duoneb  Still refusing to wear CPap at night. Long discussion about the need for CPap as related to her heart issues. She still refuses to wear it.   DVT prophylaxis:heparin Code Status:FULL Disposition Plan:TBD   Consultants:   Nephrology    Subjective: "I don't want to wear  it."  Objective: Vitals:   11/03/18 2300 11/04/18 0335 11/04/18 0500 11/04/18 0736  BP:  (!) 149/58 (!) 123/55    Pulse: 86 75    Resp: 20 (!) 24    Temp: 98 F (36.7 C) 97.6 F (36.4 C)  97.7 F (36.5 C)  TempSrc: Oral Oral  Oral  SpO2: 98% 100%    Weight:   118 kg   Height:        Intake/Output Summary (Last 24 hours) at 11/04/2018 1024 Last data filed at 11/04/2018 0336 Gross per 24 hour  Intake 700 ml  Output 1500 ml  Net -800 ml   Filed Weights   11/01/18 0411 11/02/18 0500 11/04/18 0500  Weight: 124.6 kg 120.3 kg 118 kg    Examination:  General:67 y.o.femaleresting in bed in NAD Cardiovascular: RRR, +S1, S2, no m/g/r, equal pulses throughout Respiratory: CTABL, no w/r/r,shallow breath but she appears comfortable, decreased sounds at bases GI: BS+, NDNT, no masses noted, no organomegaly noted, morbidly obese MSK: No c/c; BLE edema Skin: No rashes, bruises, ulcerations noted Neuro: A&O x 3, no focal deficits   Data Reviewed: I have personally reviewed following labs and imaging studies.  CBC: Recent Labs  Lab 10/29/18 0658 11/01/18 0710 11/04/18 0338  WBC 5.8 4.0 4.4  HGB 9.1* 8.5* 8.7*  HCT 30.7* 28.8* 29.8*  MCV 88.2 88.3 89.2  PLT 208 163 295*   Basic Metabolic Panel: Recent Labs  Lab 10/29/18 0658  10/30/18 0311  10/31/18 0253  11/01/18 0710 11/01/18 1909 11/02/18 0703 11/02/18 1622 11/03/18 0700 11/03/18 1212 11/04/18 0338  NA 140   < > 139  --  140  --  139  --  142  --  140  --  139  K 3.9   < > 4.0   < > 3.6   < > 3.5 3.4* 3.5 3.9 3.7  --  3.6  CL 98   < > 97*  --  94*  --  89*  --  85*  --  84*  --  89*  CO2 29   < > 31  --  33*  --  39*  --  44*  --  45*  --  42*  GLUCOSE 149*   < > 164*  --  167*  --  149*  --  145*  --  132*  --  169*  BUN 89*   < > 86*  --  79*  --  69*  --  59*  --  51*  --  42*  CREATININE 2.97*   < > 2.56*  --  2.05*  --  1.90*  --  1.78*  --  1.59*  --  1.42*  CALCIUM 8.6*   < > 8.8*  --  8.8*  --  8.8*  --  9.0  --  9.0  --  8.8*  MG 2.6*  --  2.7*  --  2.3  --   --   --  1.9  --   --  1.8  --    PHOS 4.9*  --  4.7*  --  3.9  --   --   --   --   --   --   --   --    < > = values in this interval not displayed.   GFR: Estimated Creatinine Clearance: 44.8 mL/min (A) (by C-G formula based on SCr of 1.42 mg/dL (H)). Liver Function Tests: No results for input(s): AST,  ALT, ALKPHOS, BILITOT, PROT, ALBUMIN in the last 168 hours. No results for input(s): LIPASE, AMYLASE in the last 168 hours. No results for input(s): AMMONIA in the last 168 hours. Coagulation Profile: No results for input(s): INR, PROTIME in the last 168 hours. Cardiac Enzymes: No results for input(s): CKTOTAL, CKMB, CKMBINDEX, TROPONINI in the last 168 hours. BNP (last 3 results) No results for input(s): PROBNP in the last 8760 hours. HbA1C: No results for input(s): HGBA1C in the last 72 hours. CBG: Recent Labs  Lab 11/03/18 0737 11/03/18 1142 11/03/18 1611 11/03/18 2058 11/04/18 0609  GLUCAP 141* 151* 166* 185* 150*   Lipid Profile: No results for input(s): CHOL, HDL, LDLCALC, TRIG, CHOLHDL, LDLDIRECT in the last 72 hours. Thyroid Function Tests: No results for input(s): TSH, T4TOTAL, FREET4, T3FREE, THYROIDAB in the last 72 hours. Anemia Panel: No results for input(s): VITAMINB12, FOLATE, FERRITIN, TIBC, IRON, RETICCTPCT in the last 72 hours. Sepsis Labs: No results for input(s): PROCALCITON, LATICACIDVEN in the last 168 hours.  Recent Results (from the past 240 hour(s))  SARS Coronavirus 2 (CEPHEID- Performed in [place] hospital lab), Hosp Order     Status: None   Collection Time: 10/27/18  4:05 AM   Specimen: Nasopharyngeal Swab  Result Value Ref Range Status   SARS Coronavirus 2 NEGATIVE NEGATIVE Final    Comment: (NOTE) If result is NEGATIVE SARS-CoV-2 target nucleic acids are NOT DETECTED. The SARS-CoV-2 RNA is generally detectable in upper and lower  respiratory specimens during the acute phase of infection. The lowest  concentration of SARS-CoV-2 viral copies this assay can detect is  250  copies / mL. A negative result does not preclude SARS-CoV-2 infection  and should not be used as the sole basis for treatment or other  patient management decisions.  A negative result may occur with  improper specimen collection / handling, submission of specimen other  than nasopharyngeal swab, presence of viral mutation(s) within the  areas targeted by this assay, and inadequate number of viral copies  (<250 copies / mL). A negative result must be combined with clinical  observations, patient history, and epidemiological information. If result is POSITIVE SARS-CoV-2 target nucleic acids are DETECTED. The SARS-CoV-2 RNA is generally detectable in upper and lower  respiratory specimens dur ing the acute phase of infection.  Positive  results are indicative of active infection with SARS-CoV-2.  Clinical  correlation with patient history and other diagnostic information is  necessary to determine patient infection status.  Positive results do  not rule out bacterial infection or co-infection with other viruses. If result is PRESUMPTIVE POSTIVE SARS-CoV-2 nucleic acids MAY BE PRESENT.   A presumptive positive result was obtained on the submitted specimen  and confirmed on repeat testing.  While 2019 novel coronavirus  (SARS-CoV-2) nucleic acids may be present in the submitted sample  additional confirmatory testing may be necessary for epidemiological  and / or clinical management purposes  to differentiate between  SARS-CoV-2 and other Sarbecovirus currently known to infect humans.  If clinically indicated additional testing with an alternate test  methodology [phone]) is advised. The SARS-CoV-2 RNA is generally  detectable in upper and lower respiratory sp ecimens during the acute  phase of infection. The expected result is Negative. Fact Sheet for Patients:  StrictlyIdeas.no Fact Sheet for Healthcare Providers:  BankingDealers.co.za This test is not yet approved or cleared by the [place] FDA and has been authorized for detection and/or diagnosis of SARS-CoV-2 by FDA under an Emergency Use Authorization (EUA).  This EUA will remain in effect (meaning this test can be used) for the duration of the COVID-19 declaration under Section 564(b)(1) of the Act, 21 U.S.C. section 360bbb-3(b)(1), unless the authorization is terminated or revoked sooner. Performed at [facility], [place] [address], [place], [place] [address]   MRSA PCR Screening     Status: Abnormal   Collection Time: 10/28/18 12:08 AM   Specimen: Nasopharyngeal  Result Value Ref Range Status   MRSA by PCR POSITIVE (A) NEGATIVE Final    Comment:        The GeneXpert MRSA Assay (FDA approved for NASAL specimens only), is one component of a comprehensive MRSA colonization surveillance program. It is not intended to diagnose MRSA infection nor to guide or monitor treatment for MRSA infections. MCDUFFIE,E RN [phone] 10/28/2018 MITCHELL,L Performed at [place] [facility] [address] [place], [place] [address]     Radiology Studies: No results found.   Scheduled Meds: . acetaZOLAMIDE  250 mg Oral QODAY  . amiodarone  200 mg Oral Daily  . aspirin EC  81 mg Oral Daily  . atorvastatin  20 mg Oral Daily  . Chlorhexidine Gluconate Cloth  6 each Topical Daily  . heparin  5,000 Units Subcutaneous Q8H  . insulin aspart  0-5 Units Subcutaneous QHS  . insulin aspart  0-9 Units Subcutaneous TID WC  . mouth rinse  15 mL Mouth Rinse BID  . midodrine  10 mg Oral TID WC  . mometasone-formoterol  2 puff Inhalation BID  . torsemide  40 mg Oral Daily   Continuous Infusions: . sodium chloride Stopped (10/31/18 0830)  . norepinephrine (LEVOPHED) Adult infusion Stopped (10/31/18 0826)     LOS: 8 days    Time spent: 35 minutes spent in the coordination of care today    Jonnie Finner, DO [facility]  Pager [phone]  If 7PM-7AM, please contact night-coverage [email or web address] Password [facility] 11/04/2018, 10:24 AM

## 2018-11-04 NOTE — Progress Notes (Signed)
[facility] NEPHROLOGY PROGRESS NOTE  Assessment/ Plan: Pt is a 68 y.o. yo female with DM, HLD, HTN, COPD, breast CA, transferred from [facility] for worsening renal failure.  She was noted to have moderate to severe pulmonary hypertension with diastolic dysfunction.  Echo with EF 60 to 65% and right ventricular systolic pressure of 85 mmHg.  #Acute kidney injury presumably due to cardiorenal syndrome.  She has significant pulmonary hypertension with right-sided heart failure.  She required Lasix drips.  Serum creatinine level trending down to 1.4 today. -Given elevated CO2, start acetazolamide every other day and resume home dose of torsemide.  Monitor BMP, urine output.  #CKD stage III, baseline creatinine level around 1.27 probably due to chronic diabetes hypertension and CHF.  #Severe pulmonary hypertension with right heart failure: Diuretics as above.  I recommend cardiology consult.  #Anemia: Monitor hemoglobin, stable.    #Hypokalemia: Monitor electrolytes.  Kidney function is improving.  Diuretics as above.  I will sign off, call us with question.  Subjective: Seen and examined at bedside.  Reported feeling good.  Denied headache, dizziness, nausea, vomiting, chest pain, shortness of breath.  Has good urine output. Objective Vital signs in last 24 hours: Vitals:   11/03/18 2300 11/04/18 0335 11/04/18 0500 11/04/18 0736  BP: (!) 149/58 (!) 123/55    Pulse: 86 75    Resp: 20 (!) 24    Temp: 98 F (36.7 C) 97.6 F (36.4 C)  97.7 F (36.5 C)  TempSrc: Oral Oral  Oral  SpO2: 98% 100%    Weight:   118 kg   Height:       Weight change:   Intake/Output Summary (Last 24 hours) at 11/04/2018 0801 Last data filed at 11/04/2018 0336 Gross per 24 hour  Intake 700 ml  Output 1900 ml  Net -1200 ml       Labs: Basic Metabolic Panel: Recent Labs  Lab 10/29/18 0658  10/30/18 0311  10/31/18 0253  11/02/18 0703 11/02/18 1622 11/03/18 0700  11/04/18 0338  NA 140   < > 139  --  140   < > 142  --  140 139  K 3.9   < > 4.0   < > 3.6   < > 3.5 3.9 3.7 3.6  CL 98   < > 97*  --  94*   < > 85*  --  84* 89*  CO2 29   < > 31  --  33*   < > 44*  --  45* 42*  GLUCOSE 149*   < > 164*  --  167*   < > 145*  --  132* 169*  BUN 89*   < > 86*  --  79*   < > 59*  --  51* 42*  CREATININE 2.97*   < > 2.56*  --  2.05*   < > 1.78*  --  1.59* 1.42*  CALCIUM 8.6*   < > 8.8*  --  8.8*   < > 9.0  --  9.0 8.8*  PHOS 4.9*  --  4.7*  --  3.9  --   --   --   --   --    < > = values in this interval not displayed.   Liver Function Tests: No results for input(s): AST, ALT, ALKPHOS, BILITOT, PROT, ALBUMIN in the last 168 hours. No results for input(s): LIPASE, AMYLASE in the last 168 hours. No results for input(s): AMMONIA in the last 168  hours. CBC: Recent Labs  Lab 10/29/18 0658 11/01/18 0710 11/04/18 0338  WBC 5.8 4.0 4.4  HGB 9.1* 8.5* 8.7*  HCT 30.7* 28.8* 29.8*  MCV 88.2 88.3 89.2  PLT 208 163 147*   Cardiac Enzymes: No results for input(s): CKTOTAL, CKMB, CKMBINDEX, TROPONINI in the last 168 hours. CBG: Recent Labs  Lab 11/03/18 0737 11/03/18 1142 11/03/18 1611 11/03/18 2058 11/04/18 0609  GLUCAP 141* 151* 166* 185* 150*    Iron Studies: No results for input(s): IRON, TIBC, TRANSFERRIN, FERRITIN in the last 72 hours. Studies/Results: No results found.  Medications: Infusions: . sodium chloride Stopped (10/31/18 0830)  . norepinephrine (LEVOPHED) Adult infusion Stopped (10/31/18 0826)    Scheduled Medications: . amiodarone  200 mg Oral Daily  . aspirin EC  81 mg Oral Daily  . atorvastatin  20 mg Oral Daily  . Chlorhexidine Gluconate Cloth  6 each Topical Daily  . heparin  5,000 Units Subcutaneous Q8H  . insulin aspart  0-5 Units Subcutaneous QHS  . insulin aspart  0-9 Units Subcutaneous TID WC  . mouth rinse  15 mL Mouth Rinse BID  . midodrine  10 mg Oral TID WC  . mometasone-formoterol  2 puff Inhalation BID     have reviewed scheduled and prn medications.  Physical Exam: General:NAD, comfortable Heart:RRR, s1s2 nl Lungs:clear b/l, no crackle Abdomen:soft, Non-tender, non-distended Extremities:trace edema Neurology: Alert, awake, following commands.  No asterixis  Dron Prasad Bhandari 11/04/2018,8:01 AM  LOS: 8 days  Pager: [phone]

## 2018-11-04 NOTE — Progress Notes (Signed)
SLP Cancellation Note  Patient Details Name: Tammy Mathews MRN: [id number] DOB: 05/27/1950   Cancelled treatment:       Reason Eval/Treat Not Completed: Other (comment) Per chart review and discussion with RN, pt was started on a diet and appears to be tolerating it well without overt oral difficulties or signs of aspiration. She recommends holding swallow evaluation as well. SLP to sign off - please reorder if needed.   Venita Sheffield Chondra Boyde 11/04/2018, 10:29 AM  Pollyann Glen, M.A. [facility] Environmental education officer [phone] Office [phone]

## 2018-11-04 NOTE — TOC Initial Note (Signed)
Transition of Care [facility]) - Initial/Assessment Note    Patient Details  Name: Tammy Mathews MRN: [id number] Date of Birth: Dec 01, 1950  Transition of Care [facility]) CM/SW Contact:    Candie Chroman, LCSW Phone Number: 11/04/2018, 4:17 PM  Clinical Narrative: CSW met with patient, introduced role, and explained that PT recommendations for SNF placement. Patient agreeable to SNF placement in [place] area. Provided CMS Medicare scores for facilities within 50 miles of her zip code. No further concerns. CSW encouraged patient to contact CSW as needed. CSW will continue to follow patient for support and facilitate discharge to SNF once medically stable.               Expected Discharge Plan: Skilled Nursing Facility Barriers to Discharge: Ship broker, Continued Medical Work up, SNF Pending bed offer   Patient Goals and CMS Choice Patient states their goals for this hospitalization and ongoing recovery are:: "To walk so I can go home." CMS Medicare.gov Compare Post Acute Care list provided to:: Patient    Expected Discharge Plan and Services Expected Discharge Plan: [place] Choice: [place] arrangements for the past 2 months: Single Family Home                                      Prior Living Arrangements/Services Living arrangements for the past 2 months: Single Family Home Lives with:: Siblings Patient language and need for interpreter reviewed:: Yes(No needs.) Do you feel safe going back to the place where you live?: Yes      Need for Family Participation in Patient Care: Yes (Comment) Care giver support system in place?: Yes (comment)   Criminal Activity/Legal Involvement Pertinent to Current Situation/Hospitalization: No - Comment as needed  Activities of Daily Living   ADL Screening (condition at time of admission) Patient's cognitive ability adequate to safely complete daily activities?: Yes Is  the patient deaf or have difficulty hearing?: No Does the patient have difficulty seeing, even when wearing glasses/contacts?: No Does the patient have difficulty concentrating, remembering, or making decisions?: No Patient able to express need for assistance with ADLs?: Yes  Permission Sought/Granted Permission sought to share information with : Facility Art therapist granted to share information with : Yes, Verbal Permission Granted     Permission granted to share info w AGENCY: SNF's        Emotional Assessment Appearance:: Appears stated age Attitude/Demeanor/Rapport: Engaged, Gracious Affect (typically observed): Accepting, Appropriate, Calm, Pleasant Orientation: : Oriented to Self, Oriented to Place, Oriented to  Time, Oriented to Situation Alcohol / Substance Use: Never Used Psych Involvement: No (comment)  Admission diagnosis:  RENAL FAIL Patient Active Problem List   Diagnosis Date Noted  . Palliative care by specialist   . Goals of care, counseling/discussion   . Acute on chronic respiratory failure with hypoxia and hypercapnia ([place]) 10/28/2018  . Acute-on-chronic renal failure ([place]) 10/28/2018  . Volume overload 10/27/2018  . AKI (acute kidney injury) ([place]) 10/27/2018  . CKD (chronic kidney disease) 10/27/2018  . Chronic diastolic CHF (congestive heart failure) ([place]) 10/27/2018  . COPD (chronic obstructive pulmonary disease) ([place]) 10/27/2018  . Acute respiratory distress   . Apnea 02/08/2018  . Arthritis 02/08/2018  . Constipation 02/08/2018  . Ductal carcinoma in situ (DCIS) of right breast 02/08/2018  . Sore on leg 02/08/2018  . Requires supplemental oxygen 02/08/2018  .  Diastolic dysfunction 94/17/4081  . Tricuspid regurgitation 05/30/2017  . Dyslipidemia 04/20/2017  . Cor pulmonale (chronic) ([place]) 04/06/2017  . Asthma 04/06/2017  . Morbid obesity due to excess calories ([place]) 04/06/2017  . Chronic respiratory failure with hypoxia and  hypercapnia ([place]) 04/06/2017  . Shortness of breath 04/05/2017  . HTN (hypertension) 04/05/2017  . Persistent atrial fibrillation 12/06/2016  . Pulmonary hypertension ([place]) 12/06/2016  . Post-op pain 11/29/2015  . Adult BMI 45.0-49.9 kg/sq m ([place]) 10/29/2015  . Carcinoma of left breast metastatic to axillary lymph node ([place]) 10/29/2015  . Type 2 diabetes mellitus with complication ([place]) 44/81/8563  . H/O therapeutic radiation 10/29/2015  . OSA (obstructive sleep apnea) 10/29/2015  . Pulmonary emphysema ([place]) 10/29/2015   PCP:  Greig Right, MD Pharmacy:   [place], [place] - [address] [address] Phone: [phone] Fax: [phone]     Social Determinants of Health (SDOH) Interventions    Readmission Risk Interventions No flowsheet data found.

## 2018-11-04 NOTE — Consult Note (Addendum)
Advanced Heart Failure Team Consult Note   Primary Physician: Greig Right, MD PCP-Cardiologist: Dr Bettina Gavia   Reason for Consultation: RV Failure/Pulmonary HTN  HPI:    Tammy Mathews is seen today for evaluation of RV failure and pulmonary hypertension at the request of Dr Marylyn Ishihara.   [name] is a 68 year old with history of breast cancer, obesity, HTN, hyperlipidemia, COPD, never smoked, chronic respiratory failure on 3 liters oxygen, and  CKD.   Admitted from  [facility] with worsening renal function and increased shortness of breath. She had a 50 pound weight 4 weeks prior to admit. At [facility] she was placed on IV diuretic with poor response and renal function worsened. Admitted by [facility] on 6/21. COVID 19 negative. Nephrology consulted and she was placed on lasix drip + diamox. Creatinine on admit was 3.7. She developed increased WOB and hypotension. CCM consulted and started bipap. Continued on lasix drip and norepi was added for hypotension. ECHO was completed and showed preserved LVEF, RV moderately reduced, severely elevated RVSP 85 mmHg, D-shaped septum, and moderate TR.  Palliative Care consulted on 6/23 for [place] and she wished to continue full code and pursue dialysis if needed. Gradually norepi and lasix drip weaned off. Hgb has been low with no obvious source. Iron sats low, feraheme given 6/25.  Renal function has continued to improved Weight down over 50 pounds. Transitioned to 40 mg torsemide daily.   Ongoing SOB with exertion. Limited ability to walk.   Review of Systems: [y] = yes, _0  = no   . General: Weight gain [Y ]; Weight loss _1 ; Anorexia _2 ; Fatigue _3 ; Fever _4 ; Chills _5 ; Weakness _6   . Cardiac: Chest pain/pressure _7 ; Resting SOB _8 ; Exertional SOB [Y; Orthopnea _9 ; Pedal Edema _10 ; Palpitations _11 ; Syncope _12 ; Presyncope _13 ; Paroxysmal nocturnal dyspnea_14   . Pulmonary: Cough _15 ; Wheezing_16 ; Hemoptysis_17 ; Sputum _18 ; Snoring _19   .  GI: Vomiting_20 ; Dysphagia_21 ; Melena_22 ; Hematochezia _23 ; Heartburn_24 ; Abdominal pain _25 ; Constipation _26 ; Diarrhea _27 ; BRBPR _28   . GU: Hematuria_29 ; Dysuria _30 ; Nocturia_31   . Vascular: Pain in legs with walking _32 ; Pain in feet with lying flat _33 ; Non-healing sores _34 ; Stroke _35 ; TIA _36 ; Slurred speech _37 ;  . Neuro: Headaches_38 ; Vertigo_39 ; Seizures_40 ; Paresthesias_41 ;Blurred vision _42 ; Diplopia _43 ; Vision changes _44   . Ortho/Skin: Arthritis _45 ; Joint pain [Y ]; Muscle pain _46 ; Joint swelling _47 ; Back Pain _48 ; Rash _49   . Psych: Depression[Y ]; Anxiety_50   . Heme: Bleeding problems _51 ; Clotting disorders _52 ; Anemia _53   . Endocrine: Diabetes [Y  ]; Thyroid dysfunction_54   Home Medications Prior to Admission medications   Medication Sig Start Date End Date Taking? Authorizing Provider  acetaminophen (TYLENOL) 500 MG tablet Take 1,000 mg by mouth every 6 (six) hours as needed for mild pain.   Yes [provider]  albuterol (PROVENTIL HFA;VENTOLIN HFA) 108 (90 Base) MCG/ACT inhaler Inhale 2 puffs into the lungs every 4 (four) hours as needed for wheezing or shortness of breath.    Yes [provider]  amiodarone (PACERONE) 200 MG tablet Take 200 mg by mouth daily.   Yes [provider]  anastrozole (ARIMIDEX) 1 MG tablet Take 1 mg by  mouth daily.   Yes [provider]  aspirin EC 81 MG tablet Take 81 mg by mouth daily.   Yes [provider]  atorvastatin (LIPITOR) 20 MG tablet Take 20 mg by mouth daily.   Yes [provider]  bisoprolol (ZEBETA) 5 MG tablet TAKE 1 TABLET BY MOUTH ONCE DAILY. Patient taking differently: Take 5 mg by mouth daily.  09/04/18  Yes Tanda Rockers, MD  budesonide (PULMICORT) 0.5 MG/2ML nebulizer solution Inhale 2 mLs into the lungs every 6 (six) hours as needed for shortness of breath. 06/23/18  Yes [provider]  budesonide-formoterol (SYMBICORT) 80-4.5 MCG/ACT inhaler Take 2 puffs  first thing in am and then another 2 puffs about 12 hours later. 06/01/17  Yes Tanda Rockers, MD  insulin glargine (LANTUS) 100 UNIT/ML injection Inject 50 Units into the skin at bedtime.   Yes [provider]  insulin lispro (HUMALOG) 100 UNIT/ML injection Inject 10 Units into the skin every evening.    Yes [provider]  ipratropium-albuterol (DUONEB) 0.5-2.5 (3) MG/3ML SOLN Take 3 mLs by nebulization every 4 (four) hours as needed.   Yes [provider]  loratadine (CLARITIN) 10 MG tablet Take 10 mg by mouth daily as needed for allergies.   Yes [provider]  meclizine (ANTIVERT) 25 MG tablet Take 25 mg by mouth every 6 (six) hours as needed for dizziness.    Yes [provider]  olmesartan (BENICAR) 40 MG tablet Take 40 mg by mouth daily. 09/04/18  Yes [provider]  OXYGEN 3 L continuous. [facility]   Yes [provider]  torsemide (DEMADEX) 20 MG tablet Take 40 mg by mouth daily. 09/04/18  Yes [provider]  [place] test strip  02/06/18   [provider]    Past Medical History: Past Medical History:  Diagnosis Date  . Breast cancer ([place])   . COPD (chronic obstructive pulmonary disease) ([place])   . Diabetes mellitus type 2 in obese ([place])   . HLD (hyperlipidemia)   . HTN (hypertension)   . Morbid obesity ([place])     Past Surgical History: Past Surgical History:  Procedure Laterality Date  . BREAST SURGERY    . HERNIA REPAIR      Family History: Family History  Problem Relation Age of Onset  . Heart disease Mother   . Colon cancer Mother   . Breast cancer Mother   . Emphysema Father        smoked  . Emphysema Brother        smoked    Social History: Social History   Socioeconomic History  . Marital status: Single    Spouse name: Not on file  . Number of children: Not on file  . Years of education: Not on file  . Highest education level: Not on file  Occupational History  .  Not on file  Social Needs  . Financial resource strain: Not on file  . Food insecurity    Worry: Not on file    Inability: Not on file  . Transportation needs    Medical: Not on file    Non-medical: Not on file  Tobacco Use  . Smoking status: Never Smoker  . Smokeless tobacco: Never Used  Substance and Sexual Activity  . Alcohol use: Never    Frequency: Never  . Drug use: Never  . Sexual activity: Not on file  Lifestyle  . Physical activity    Days per week:  Not on file    Minutes per session: Not on file  . Stress: Not on file  Relationships  . Social Herbalist on phone: Not on file    Gets together: Not on file    Attends religious service: Not on file    Active member of club or organization: Not on file    Attends meetings of clubs or organizations: Not on file    Relationship status: Not on file  Other Topics Concern  . Not on file  Social History Narrative  . Not on file    Allergies:  Allergies  Allergen Reactions  . Codeine Nausea And Vomiting and Other (See Comments)    Unknown   . Morphine And Related Nausea And Vomiting    Objective:    Vital Signs:   Temp:  [97.6 F (36.4 C)-98 F (36.7 C)] 97.7 F (36.5 C) (06/29 0736) Pulse Rate:  [75-87] 75 (06/29 0335) Resp:  [18-26] 24 (06/29 0335) BP: (114-149)/(41-61) 123/55 (06/29 0335) SpO2:  [95 %-100 %] 100 % (06/29 0335) Weight:  [546 kg] 118 kg (06/29 0500) Last BM Date: 11/03/18  Weight change: Filed Weights   11/01/18 0411 11/02/18 0500 11/04/18 0500  Weight: 124.6 kg 120.3 kg 118 kg    Intake/Output:   Intake/Output Summary (Last 24 hours) at 11/04/2018 1120 Last data filed at 11/04/2018 0336 Gross per 24 hour  Intake 700 ml  Output 1500 ml  Net -800 ml      Physical Exam    General: Appears chronically ill. No resp difficulty. Sitting in the chair.  HEENT: normal except poor dentition Neck: supple. JVP 6-7. Carotids 2+ bilat; no bruits. No lymphadenopathy or  thyromegaly appreciated. Cor: PMI nondisplaced. Regular rate & rhythm. No rubs, gallops or murmurs. Lungs: clear on 2 liters oxygen.  Abdomen: soft, nontender, nondistended. No hepatosplenomegaly. No bruits or masses. Good bowel sounds. Extremities: no cyanosis, clubbing, rash, R and LLE unna boots Neuro: alert & orientedx3, cranial nerves grossly intact. moves all 4 extremities w/o difficulty. Affect pleasant   Telemetry   NSR 90s  EKG    N/A  Labs   Basic Metabolic Panel: Recent Labs  Lab 10/29/18 0658  10/30/18 0311  10/31/18 0253  11/01/18 0710 11/01/18 1909 11/02/18 0703 11/02/18 1622 11/03/18 0700 11/03/18 1212 11/04/18 0338  NA 140   < > 139  --  140  --  139  --  142  --  140  --  139  K 3.9   < > 4.0   < > 3.6   < > 3.5 3.4* 3.5 3.9 3.7  --  3.6  CL 98   < > 97*  --  94*  --  89*  --  85*  --  84*  --  89*  CO2 29   < > 31  --  33*  --  39*  --  44*  --  45*  --  42*  GLUCOSE 149*   < > 164*  --  167*  --  149*  --  145*  --  132*  --  169*  BUN 89*   < > 86*  --  79*  --  69*  --  59*  --  51*  --  42*  CREATININE 2.97*   < > 2.56*  --  2.05*  --  1.90*  --  1.78*  --  1.59*  --  1.42*  CALCIUM 8.6*   < >  8.8*  --  8.8*  --  8.8*  --  9.0  --  9.0  --  8.8*  MG 2.6*  --  2.7*  --  2.3  --   --   --  1.9  --   --  1.8  --   PHOS 4.9*  --  4.7*  --  3.9  --   --   --   --   --   --   --   --    < > = values in this interval not displayed.    Liver Function Tests: No results for input(s): AST, ALT, ALKPHOS, BILITOT, PROT, ALBUMIN in the last 168 hours. No results for input(s): LIPASE, AMYLASE in the last 168 hours. No results for input(s): AMMONIA in the last 168 hours.  CBC: Recent Labs  Lab 10/29/18 0658 11/01/18 0710 11/04/18 0338  WBC 5.8 4.0 4.4  HGB 9.1* 8.5* 8.7*  HCT 30.7* 28.8* 29.8*  MCV 88.2 88.3 89.2  PLT 208 163 147*    Cardiac Enzymes: No results for input(s): CKTOTAL, CKMB, CKMBINDEX, TROPONINI in the last 168 hours.  BNP: BNP  (last 3 results) Recent Labs    10/27/18 2210  BNP 778.0*    ProBNP (last 3 results) No results for input(s): PROBNP in the last 8760 hours.   CBG: Recent Labs  Lab 11/03/18 0737 11/03/18 1142 11/03/18 1611 11/03/18 2058 11/04/18 0609  GLUCAP 141* 151* 166* 185* 150*    Coagulation Studies: No results for input(s): LABPROT, INR in the last 72 hours.   Imaging    No results found.   Medications:     Current Medications: . acetaZOLAMIDE  250 mg Oral QODAY  . amiodarone  200 mg Oral Daily  . aspirin EC  81 mg Oral Daily  . atorvastatin  20 mg Oral Daily  . Chlorhexidine Gluconate Cloth  6 each Topical Daily  . heparin  5,000 Units Subcutaneous Q8H  . insulin aspart  0-5 Units Subcutaneous QHS  . insulin aspart  0-9 Units Subcutaneous TID WC  . mouth rinse  15 mL Mouth Rinse BID  . midodrine  10 mg Oral TID WC  . mometasone-formoterol  2 puff Inhalation BID  . potassium chloride  20 mEq Oral BID  . torsemide  40 mg Oral Daily     Infusions: . sodium chloride Stopped (10/31/18 0830)  . norepinephrine (LEVOPHED) Adult infusion Stopped (10/31/18 0826)       Assessment/Plan   1. A/C Respiratory Failure  -On 3 liters chronically at home.  - Initially on bipap but later weaned with diuresis. O2 sats stable on 2 liters oxygen.   2. AKI on CKD Nephrology consulted on admit. Creatinine on admit 3.7.  -Todays creatinine is 1.4  -BMET daily.   3. RV Failure  10/28/2018 Moderately reduced RV on ECHO.  Diuresed with lasix drip + metolazone. Diuresed > 50 pounds.  - Volume status much improved. For now continue torsemide 40 mg daily but adjust based on RHC.   - Add 12.5 mg spironolactone daily.   4. Pulmonary HTN Severely Elevated PA pressures on ECHO. Will need RHC to further assess. Suspect obesity/OSA major factor. Intolerant CPAP and has been refusing.    5.  DMII  6. Obesity Body mass index is 49.15 kg/m.'  7. Anemia Iron sats 8 on 6/24,  received feraheme on 6/25  Hgb 8.7  No obvious source of bleeding.   8. Severe Deconditioning PT following. Planning  for SNF  Length of Stay: [place], NP  11/04/2018, 11:20 AM  Advanced Heart Failure Team Pager [phone] (M-F; 7a - 4p)  Please contact [facility] for night-coverage after hours (4p -7a ) and weekends on [email or web address]  Patient seen with NP, agree with the above note.   She was admitted with severe RV failure and marked volume overload as well as AKI.  She is now well-diuresed, creatinine down to 1.4.  She is on po torsemide.   General: NAD Neck: JVP 8 cm, no thyromegaly or thyroid nodule.  Lungs: Clear to auscultation bilaterally with normal respiratory effort. CV: Nondisplaced PMI.  Heart regular S1/S2, no S3/S4, no murmur.  Unna boots lower legs.   Abdomen: Soft, nontender, no hepatosplenomegaly, no distention.  Skin: Intact without lesions or rashes.  Neurologic: Alert and oriented x 3.  Psych: Normal affect. Extremities: No clubbing or cyanosis.  HEENT: Normal.   1. Acute on chronic hypoxemic respiratory failure: She has been labeled as having COPD but never smoked.  Based on body habitus, OHS/OSA is a possibility.  She is on 3 L home oxygen.  She is unable to tolerate CPAP.  2. Acute on chronic diastolic CHF with prominent RV failure: Echo (6/20) with EF 60-65%, severely dilated RV with moderately decreased RV systolic function, moderate TR, PASP 85 mmHg. It appears that she was markedly volume overloaded at admission, now volume status much improved.  - Continue torsemide 40 mg daily with KCl 20 mEq bid.  - Midodrine being used to maintain BP.  - needs RHC (see below).  3. Atrial fibrillation: Paroxysmal.  She has only been in NSR here.  Apparently she was on anticoagulation (not sure what) a while ago and had severe epistaxis.  She stopped anticoagulation and does not want to restart.  She is on ASA currently.  - Continue amiodarone to try to maintain  NSR. Outside chance that she could have amiodarone lung toxicity.  Will be getting high resolution CT chest (see below) and will also decrease amiodarone to 100 mg daily.  May be able to stop in the future.  4. Pulmonary hypertension: Severe pulmonary hypertension by echo with RV dysfunction.  Etiology uncertain.  She has chronic hypoxemic respiratory failure.  This certainly could be due to long-standing pulmonary hypertension, but also possibly due to OSH/OSA.  She has never smoked so COPD unlikely.  - She will need RHC.  Will plan for Wednesday morning.  Discussed risks/benefits with patient and she agrees to procedure.  - I will obtain high resolution CT of chest to look for parenchymal lung disease.  Amiodarone toxicity certainly is a consideration.   - V/Q scan to rule out chronic PE.  - Send pulmonary hypertension serologic workup: ANA, RF, anti-SCL70, anti-centromere.  - It is possible that [place] is group 3 due to lung disease (OHS/OSA or other).  However, if it looks out of proportion to degree of pulmonary disease, pulmonary vasodilator trial would be reasonable.  5. AKI on CKD stage 3: Creatinine back down to 1.4.  Follow closely.   Loralie Champagne 11/04/2018 4:45 PM

## 2018-11-05 DIAGNOSIS — I272 Pulmonary hypertension, unspecified: Secondary | ICD-10-CM

## 2018-11-05 LAB — CBC WITH DIFFERENTIAL/PLATELET
Abs Immature Granulocytes: 0.06 10*3/uL (ref 0.00–0.07)
Basophils Absolute: 0 10*3/uL (ref 0.0–0.1)
Basophils Relative: 1 %
Eosinophils Absolute: 0.1 10*3/uL (ref 0.0–0.5)
Eosinophils Relative: 2 %
HCT: 29.5 % — ABNORMAL LOW (ref 36.0–46.0)
Hemoglobin: 8.7 g/dL — ABNORMAL LOW (ref 12.0–15.0)
Immature Granulocytes: 1 %
Lymphocytes Relative: 15 %
Lymphs Abs: 0.8 10*3/uL (ref 0.7–4.0)
MCH: 26.3 pg (ref 26.0–34.0)
MCHC: 29.5 g/dL — ABNORMAL LOW (ref 30.0–36.0)
MCV: 89.1 fL (ref 80.0–100.0)
Monocytes Absolute: 0.6 10*3/uL (ref 0.1–1.0)
Monocytes Relative: 11 %
Neutro Abs: 3.7 10*3/uL (ref 1.7–7.7)
Neutrophils Relative %: 70 %
Platelets: 145 10*3/uL — ABNORMAL LOW (ref 150–400)
RBC: 3.31 MIL/uL — ABNORMAL LOW (ref 3.87–5.11)
RDW: 16.1 % — ABNORMAL HIGH (ref 11.5–15.5)
WBC: 5.2 10*3/uL (ref 4.0–10.5)
nRBC: 0 % (ref 0.0–0.2)

## 2018-11-05 LAB — GLUCOSE, CAPILLARY
Glucose-Capillary: 153 mg/dL — ABNORMAL HIGH (ref 70–99)
Glucose-Capillary: 196 mg/dL — ABNORMAL HIGH (ref 70–99)
Glucose-Capillary: 200 mg/dL — ABNORMAL HIGH (ref 70–99)
Glucose-Capillary: 158 mg/dL — ABNORMAL HIGH (ref 70–99)

## 2018-11-05 LAB — BASIC METABOLIC PANEL
Anion gap: 10 (ref 5–15)
BUN: 35 mg/dL — ABNORMAL HIGH (ref 8–23)
CO2: 36 mmol/L — ABNORMAL HIGH (ref 22–32)
Calcium: 8.9 mg/dL (ref 8.9–10.3)
Chloride: 92 mmol/L — ABNORMAL LOW (ref 98–111)
Creatinine, Ser: 1.35 mg/dL — ABNORMAL HIGH (ref 0.44–1.00)
GFR calc Af Amer: 46 mL/min — ABNORMAL LOW (ref >60)
GFR calc non Af Amer: 40 mL/min — ABNORMAL LOW (ref >60)
Glucose, Bld: 158 mg/dL — ABNORMAL HIGH (ref 70–99)
Potassium: 4 mmol/L (ref 3.5–5.1)
Sodium: 138 mmol/L (ref 135–145)

## 2018-11-05 LAB — MAGNESIUM: Magnesium: 1.9 mg/dL (ref 1.7–2.4)

## 2018-11-05 MED ORDER — SODIUM CHLORIDE 0.9% FLUSH
10.0000 mL | INTRAVENOUS | Status: DC | PRN
  Administered 2018-11-08: 10 mL
  Filled 2018-11-05: qty 40

## 2018-11-05 MED ORDER — ASPIRIN 81 MG PO CHEW
81.0000 mg | CHEWABLE_TABLET | ORAL | Status: AC
  Administered 2018-11-06: 81 mg via ORAL
  Filled 2018-11-05: qty 1

## 2018-11-05 MED ORDER — SODIUM CHLORIDE 0.9% FLUSH: 3.0000 mL | INTRAVENOUS | Status: DC | PRN

## 2018-11-05 MED ORDER — SODIUM CHLORIDE 0.9 % IV SOLN: INTRAVENOUS | Status: DC

## 2018-11-05 MED ORDER — SODIUM CHLORIDE 0.9 % IV SOLN
250.0000 mL | INTRAVENOUS | Status: DC | PRN
  Administered 2018-11-06: 250 mL via INTRAVENOUS

## 2018-11-05 MED ORDER — MAGNESIUM SULFATE IN D5W 1-5 GM/100ML-% IV SOLN
1.0000 g | Freq: Once | INTRAVENOUS | Status: AC
  Administered 2018-11-05: 1 g via INTRAVENOUS
  Filled 2018-11-05: qty 100

## 2018-11-05 MED ORDER — AMIODARONE HCL 100 MG PO TABS
100.0000 mg | ORAL_TABLET | Freq: Every day | ORAL | Status: DC
  Administered 2018-11-05 – 2018-11-08 (×4): 100 mg via ORAL
  Filled 2018-11-05 (×3): qty 1

## 2018-11-05 MED ORDER — SPIRONOLACTONE 12.5 MG HALF TABLET
12.5000 mg | ORAL_TABLET | Freq: Every day | ORAL | Status: DC
  Administered 2018-11-05 – 2018-11-08 (×4): 12.5 mg via ORAL
  Filled 2018-11-05 (×4): qty 1

## 2018-11-05 NOTE — TOC Progression Note (Addendum)
Transition of Care [facility]) - Progression Note    Patient Details  Name: Tammy Mathews MRN: [id number] Date of Birth: [birth year]  Transition of [facility]) CM/SW [place], LCSW Phone Number: 11/05/2018, 11:04 AM  Clinical Narrative: [facility] unable to make bed offer. Patient aware. She asked CSW to check with [facility] and then [facility]. Left message for [facility] admissions coordinator asking her to review referral.  2:14 pm: [facility] is unable to offer a bed but [facility] is and patient is agreeable. Admissions coordinator will go ahead and start insurance authorization in case she is discharged in the next few days. MD is aware she will need a COVID test done within 48 hours of discharge.  Expected Discharge Plan: Skilled Nursing Facility Barriers to Discharge: Ship broker, Continued Medical Work up, SNF Pending bed offer  Expected Discharge Plan and Services Expected Discharge Plan: [place] Choice: [place] arrangements for the past 2 months: Single Family Home                                       Social Determinants of Health (SDOH) Interventions    Readmission Risk Interventions No flowsheet data found.

## 2018-11-05 NOTE — Progress Notes (Signed)
Patient called RN to room and asked to speak to the CSW.  She states she would like CSW to look into [facility] in [place], [place].  She saw the star ratings for [facility] and thinks [facility] may be a more quality facility.  CSW notified and is working on this referral.

## 2018-11-05 NOTE — H&P (View-Only) (Signed)
Advanced Heart Failure Rounding Note  PCP-Cardiologist: No primary care provider on file.   Subjective:    Denies SOB. Denies chest pain.    Objective:   Weight Range: 117.1 kg Body mass index is 48.78 kg/m.   Vital Signs:   Temp:  [97.6 F (36.4 C)-98.7 F (37.1 C)] 98.7 F (37.1 C) (06/30 0720) Pulse Rate:  [75-90] 80 (06/30 0720) Resp:  [16-29] 22 (06/30 0720) BP: (89-136)/(48-86) 118/54 (06/30 0720) SpO2:  [93 %-100 %] 96 % (06/30 0720) Weight:  [117.1 kg] 117.1 kg (06/30 0500) Last BM Date: 11/03/18  Weight change: Filed Weights   11/02/18 0500 11/04/18 0500 11/05/18 0500  Weight: 120.3 kg 118 kg 117.1 kg    Intake/Output:   Intake/Output Summary (Last 24 hours) at 11/05/2018 0753 Last data filed at 11/05/2018 0721 Gross per 24 hour  Intake 222 ml  Output 2500 ml  Net -2278 ml      Physical Exam    General:  Appears chronically ill. No resp difficulty. R upper chest porta cath HEENT:normal except poor dentition Neck: Supple. JVP 5-6 . Carotids 2+ bilat; no bruits. No lymphadenopathy or thyromegaly appreciated.  Cor: PMI nondisplaced. Regular rate & rhythm. No rubs, gallops or murmurs. Lungs: Decreased in the bases on 2 liters  Abdomen: Soft, nontender, nondistended. No hepatosplenomegaly. No bruits or masses. Good bowel sounds. Extremities: No cyanosis, clubbing, rash, R and LLE unnal boots.  Neuro: Alert & orientedx3, cranial nerves grossly intact. moves all 4 extremities w/o difficulty. Affect pleasant   Telemetry   NSR 80s   EKG    N/A  Labs    CBC Recent Labs    11/04/18 0338 11/05/18 0553  WBC 4.4 5.2  NEUTROABS  --  3.7  HGB 8.7* 8.7*  HCT 29.8* 29.5*  MCV 89.2 89.1  PLT 147* 483*   Basic Metabolic Panel Recent Labs    11/03/18 1212 11/04/18 0338 11/05/18 0553  NA  --  139 138  K  --  3.6 4.0  CL  --  89* 92*  CO2  --  42* 36*  GLUCOSE  --  169* 158*  BUN  --  42* 35*  CREATININE  --  1.42* 1.35*  CALCIUM  --   8.8* 8.9  MG 1.8  --  1.9   Liver Function Tests No results for input(s): AST, ALT, ALKPHOS, BILITOT, PROT, ALBUMIN in the last 72 hours. No results for input(s): LIPASE, AMYLASE in the last 72 hours. Cardiac Enzymes No results for input(s): CKTOTAL, CKMB, CKMBINDEX, TROPONINI in the last 72 hours.  BNP: BNP (last 3 results) Recent Labs    10/27/18 2210  BNP 778.0*    ProBNP (last 3 results) No results for input(s): PROBNP in the last 8760 hours.   D-Dimer No results for input(s): DDIMER in the last 72 hours. Hemoglobin A1C No results for input(s): HGBA1C in the last 72 hours. Fasting Lipid Panel No results for input(s): CHOL, HDL, LDLCALC, TRIG, CHOLHDL, LDLDIRECT in the last 72 hours. Thyroid Function Tests No results for input(s): TSH, T4TOTAL, T3FREE, THYROIDAB in the last 72 hours.  Invalid input(s): FREET3  Other results:   Imaging    Dg Chest 2 View  Result Date: 11/04/2018 CLINICAL DATA:  CHF.  History of hypertension. EXAM: CHEST - 2 VIEW COMPARISON:  Chest x-ray 10/31/2018 FINDINGS: The right subclavian power port is stable. Very low lung volumes with vascular crowding and atelectasis. There is moderate vascular congestion without overt pulmonary  edema. Stable elevation right hemidiaphragm and overlying small pleural effusion. Persistent streaky areas of subsegmental atelectasis. IMPRESSION: Cardiac enlargement and vascular congestion without overt pulmonary edema. Elevation right hemidiaphragm with overlying small effusion. Persistent areas of subsegmental atelectasis. Electronically Signed   By: Marijo Sanes M.D.   On: 11/04/2018 15:52   Nm Pulmonary Perfusion  Result Date: 11/04/2018 CLINICAL DATA:  Pulmonary hypertension.  Shortness of breath. EXAM: NUCLEAR MEDICINE PERFUSION LUNG SCAN TECHNIQUE: Perfusion images were obtained in multiple projections after intravenous injection of radiopharmaceutical. Ventilation scans intentionally deferred if perfusion  scan and chest x-ray adequate for interpretation during COVID 19 epidemic. The patient declined further as she was unable to lie flat. RADIOPHARMACEUTICALS:  1.5 mCi Tc-15mMAA IV COMPARISON:  Chest radiograph 10/31/2018. FINDINGS: Single moderate size perfusion defect is identified within the left midlung. This corresponds to an area of subsegmental atelectasis or scarring noted on previous chest radiograph. Asymmetric elevation of right hemidiaphragm is noted corresponding to the chest radiograph. IMPRESSION: 1. There is a single moderate size perfusion defect within the left midlung. Although indeterminate a corresponding area of scar/subsegmental atelectasis is noted within the left midlung. Given the absence of ventilation images, modified PIOPED criteria do not apply. No additional perfusion defects identified to suggest multiple pulmonary emboli. Electronically Signed   By: TKerby MoorsM.D.   On: 11/04/2018 15:50   Ct Chest High Resolution  Result Date: 11/04/2018 CLINICAL DATA:  Pulmonary hypertension suspected EXAM: CT CHEST WITHOUT CONTRAST TECHNIQUE: Multidetector CT imaging of the chest was performed following the standard protocol without intravenous contrast. High resolution imaging of the lungs, as well as inspiratory and expiratory imaging, was performed. COMPARISON:  CT chest, 10/24/2018 FINDINGS: Cardiovascular: Cardiomegaly. Left coronary artery calcifications. The main pulmonary artery is enlarged, measuring up to 4.2 cm in caliber. No pericardial effusion. Right subclavian port catheter. Mediastinum/Nodes: No enlarged mediastinal, hilar, or axillary lymph nodes. Thyroid gland, trachea, and esophagus demonstrate no significant findings. Lungs/Pleura: Bibasilar consolidation or atelectasis, similar to prior examination and most conspicuous in the right lung base. Small pleural effusions, similar to prior examination. No significant air trapping on expiratory phase imaging. Upper Abdomen: No  acute abnormality. Coarse contour of the liver. Trace perihepatic ascites. Splenomegaly. Musculoskeletal: Status post left mastectomy. There is a spiculated mass in the left axilla containing a biopsy clip (series 4, image 22). IMPRESSION: 1. Bibasilar consolidation or atelectasis, similar to prior examination and most conspicuous in the right lung base. Small pleural effusions, similar to prior examination. No significant air trapping on expiratory phase imaging. No apparent evidence of fibrotic interstitial lung disease, however the presence of effusions and atelectasis significantly limits evaluation for subtle changes. 2. The main pulmonary artery is enlarged, measuring up to 4.2 cm in caliber, which can be seen in pulmonary hypertension. 3.  Coronary artery disease. 4. There is a spiculated mass in the left axilla containing a biopsy clip (series 4, image 22). Correlate with mammographic evaluation. 5. Coarse contour of the liver and splenomegaly, findings suggestive of hepatic cirrhosis. Electronically Signed   By: AEddie CandleM.D.   On: 11/04/2018 17:52      Medications:     Scheduled Medications:  acetaZOLAMIDE  250 mg Oral QODAY   amiodarone  200 mg Oral Daily   aspirin EC  81 mg Oral Daily   atorvastatin  20 mg Oral Daily   Chlorhexidine Gluconate Cloth  6 each Topical Daily   heparin  5,000 Units Subcutaneous Q8H   insulin aspart  0-5 Units Subcutaneous QHS   insulin aspart  0-9 Units Subcutaneous TID WC   mouth rinse  15 mL Mouth Rinse BID   midodrine  10 mg Oral TID WC   mometasone-formoterol  2 puff Inhalation BID   potassium chloride  20 mEq Oral BID   sodium chloride flush  3 mL Intravenous Q12H   torsemide  40 mg Oral Daily     Infusions:  sodium chloride Stopped (10/31/18 0830)   norepinephrine (LEVOPHED) Adult infusion Stopped (10/31/18 0826)     PRN Medications:  sodium chloride, acetaminophen **OR** acetaminophen, ipratropium-albuterol,  meclizine, ondansetron, polyvinyl alcohol, sodium chloride flush   Assessment/Plan  1. A/C Respiratory Failure  -On 3 liters chronically at home.  - CTA with bibasilar atelectasis. Add incentive spirometer.  -VQ - negative for PE - Initially on bipap but later weaned with diuresis. O2 sats stable on 2 liters oxygen.   2. AKI on CKD Nephrology consulted on admit. Creatinine on admit 3.7.  -Todays creatinine is 1.3.  - Stable.    3. RV Failure  10/28/2018 Moderately reduced RV on ECHO.  Diuresed with lasix drip + metolazone. Diuresed > 50 pounds.  CTA - Enlarged pulmonary artery, bibasilar atelectasis, RLL , CAD,  - Volume status stable. Continue torsemide 40 mg daily but adjust based on RHC.   - Add 12.5 mg spironolactone daily.   4. Pulmonary HTN Severely Elevated PA pressures on ECHO. VQ - I personally called and discussed with Dr Nelson Chimes. He does not think this appears to be PE. He did not recommend additional testing.    CTA - Enlarged pulmonary artery, bibasilar atelectasis in RLL. No evidence of interstitial lung disease.  Serology labs pending: RF , SCL, centromere, ANCA, ANA -RHC set for 7/1 for further assess.  -Suspect obesity/OSA major factor. Intolerant CPAP and has been refusing.    5.  DMII  6. Obesity .Body mass index is 48.78 kg/m.  7. Anemia Iron sats 8 on 6/24, received feraheme on 6/25  Hgb 8.7 Stable today.  No obvious source of bleeding.   8. Severe Deconditioning PT following. Planning for SNF  9. H/O Breast Cancer  S/P L mastectomy. Followed every 3 months by Dr Hinton Rao in [facility] CTA -spiculated mass in the left axilla containing a biopsy clip (series 4, image 22). Correlate with mammographic evaluation.  10. PAF Maintaining NSR. Cut amio 100 mg daily.  CTA - No interstitial lung disease.    Length of Stay: [place], NP  11/05/2018, 7:53 AM  Advanced Heart Failure Team Pager [phone] (M-F; 7a - 4p)  Please contact [facility] for night-coverage after hours (4p -7a ) and weekends on [email or web address]  Patient seen with NP, agree with the above note.   Weight down another 2 lbs and I/Os negative on torsemide 40 mg daily. Oxygen down to 2 L by nasal cannula.  Has not been out of bed much.   General: NAD Neck: JVP 9-10 cm, no thyromegaly or thyroid nodule.  Lungs: Decreased BS at bases.  CV: Nonpalpable PMI.  Heart regular S1/S2, no S3/S4, 1/6 SEM RUSB.  No edema, legs wrapped.   Abdomen: Soft, nontender, no hepatosplenomegaly, no distention.  Skin: Intact without lesions or rashes.  Neurologic: Alert and oriented x 3.  Psych: Normal affect. Extremities: No clubbing or cyanosis.  HEENT: Normal.   1. Acute on chronic hypoxemic respiratory failure: She has been labeled as having COPD but never smoked.  Based on body habitus, OHS/OSA  is a possibility.  She is on 3 L home oxygen.  She is unable to tolerate CPAP.  2. Acute on chronic diastolic CHF with prominent RV failure: Echo (6/20) with EF 60-65%, severely dilated RV with moderately decreased RV systolic function, moderate TR, PASP 85 mmHg. She was markedly volume overloaded on exam and has diuresed well.  Suspect by exam she has some ongoing volume overload. Good diuresis with torsemide yesterday, weight still coming down.  - Continue torsemide 40 mg daily with KCl 20 mEq bid.  - Midodrine being used to maintain BP, will try decreasing to 5 mg tid.  - needs RHC (see below).  3. Atrial fibrillation: Paroxysmal.  She has only been in NSR here.  Apparently she was on anticoagulation (not sure what) a while ago and had severe epistaxis.  She stopped anticoagulation and does not want to restart. She is on ASA currently.  - Continue amiodarone to try to maintain NSR. I decreased amiodarone to 100 mg daily.  May be able to stop in the future. CT chest is not suggestive of amiodarone lung toxicity.  4. Pulmonary hypertension: Severe pulmonary hypertension by echo with RV  dysfunction.  Etiology uncertain.  She has chronic hypoxemic respiratory failure.  This certainly could be due to long-standing pulmonary hypertension, but also possibly due to OSH/OSA.  She has never smoked so COPD unlikely. High resolution CT chest did not show evidence for ILD, there was bibasilar atelectasis.  V/Q scan with perfusion defect likely due to atelectasis. Discussed with radiology, unlikely PE and do not recommend further workup.  - She will need RHC.  Will plan for Wednesday morning.  Discussed risks/benefits with patient and she agrees to procedure.  - Send pulmonary hypertension serologic workup: ANA, RF, anti-SCL70, anti-centromere.  - It is possible that [place] is group 3 due to lung disease (OHS/OSA or other).  However, if it looks out of proportion to degree of pulmonary disease, pulmonary vasodilator trial would be reasonable.  5. AKI on CKD stage 3: Creatinine back down to 1.35.  Follow closely.  6. Mass in left axilla: She will need followup with her oncologist after discharge.   Plan for SNF in [place].   Loralie Champagne 11/05/2018 10:05 AM

## 2018-11-05 NOTE — Progress Notes (Signed)
[name] Kitchen  PROGRESS NOTE    Tammy Mathews  PJK:[id number] DOB: Oct 08, 1950 DOA: 10/27/2018 PCP: Greig Right, MD   Brief Narrative:   [name], transferred from another hospital to [facility] due to concerns for worsening renal function and shortness of breath. HX includes breast cancer, morbid obesity, hypertension, hyperlipidemia, diabetes mellitus, chronic kidney disease and COPD on 3 L of oxygen. Patient was reported to have left-sided chest pain, shortness of breath, dyspnea on exertion, orthopnea, leg edema and 50 pound weight gain in the last month.  Previous Echocardiogram reveals moderate to severe pulmonary hypertension with likely diastolic dysfunction.She was admitted to cardiology floor 6/21 but has gotten progressively worsening shortness of breath, needing BIPAP . Lasix drip initiated by nephrology and thus far very poor urine output.   Assessment & Plan:   Principal Problem:   Acute on chronic respiratory failure with hypoxia and hypercapnia (HCC) Active Problems:   Volume overload   AKI (acute kidney injury) ([place])   CKD (chronic kidney disease)   Chronic diastolic CHF (congestive heart failure) (HCC)   COPD (chronic obstructive pulmonary disease) (HCC)   Acute respiratory distress   Acute-on-chronic renal failure (HCC)   Palliative care by specialist   Goals of care, counseling/discussion   Acute on chronic diastolic heart failure Severe pulmonary hypertension Right sided heart failure, cor pulmonale Cardiogenic shock requiring vasopressor support - now off pressors; BPs ok -now offlasix gtt; appreciate nephro assistance     - now on diamox/toresemide     - appreciate cards assistance; possible RHC on Wednesday, HR CT chest does not show ILD; VQ scan w/o PE  Cardiorenal syndrome AKI on CKD CKD stage III at baseline - diuresis as tolerated by patient; appreciate nephro direction     - now on diamox/torsemide/spironolactone    Electrolyte imbalances secondary to diuresis - watch K+/Mg2+; maintain K+ grtr than 4 and Mg2+ grtr than 2  Acute on chronic hypoxemic hypercarbic respiratory failure - CPAP/BiPAP at night; O2 support during the day; wean as able  Diabetes type 2 - CBGs with SSI - glucose acceptable  Vertigo - meclazine  Normocytic anemia - stable, no frank bleed noted, monitor - iron labs noted  Morbid obesity - diet/exercise as able  Debility - PT consult recs SNF  Chronic periodontitis with severe bone loss Generalized gingival recession Supra eruption and drifting of the unopposed teeth into the edentulous areas - eval'd by dental team; patient has denied surgical correction at this time - per dental team:The patient may be willing to proceed with extractions with a local oral surgeon in [place], [place] once she is more medically stable after this admission. The patient will then follow-up with the general dentist of her choice for fabrication of upper and lower complete dentures after adequate healing. Patient is aware that due to her significant atrophy of the edentulous alveolar ridges, the prognosis for successful upper and lower complete denture fabrication is significantly limited. Patient is aware of implant options but has limited economic resources at this time. Patient is aware that Medicaid should cover the cost associated with the dental extraction procedures with an oral surgeon.I will assist the patient in follow-up with the oral surgeon if so instructed by the patient.  COPD - home regimen: pulmicort/albuterol - continue dulera, duoneb  Continuing to diurese. Will need right heart w/u. Cards to [facility] tomorrow. Appreciate assistance. ANA/antibodies testing still not resulted. Will be on the look out. Otherwise stable. Continue as above.   DVT prophylaxis:heparin  Code Status:FULL Disposition  Plan:TBD   Consultants:   Neprhology  Cardiology   Subjective: "I do not have apnea."  Objective: Vitals:   11/04/18 2039 11/04/18 2348 11/05/18 0400 11/05/18 0500  BP:  (!) 119/48 113/86   Pulse:  75    Resp:  (!) 21 16   Temp:  98.2 F (36.8 C) 97.6 F (36.4 C)   TempSrc:  Oral Oral   SpO2: 100% 100% 99%   Weight:    117.1 kg  Height:        Intake/Output Summary (Last 24 hours) at 11/05/2018 0710 Last data filed at 11/05/2018 0500 Gross per 24 hour  Intake 222 ml  Output 2300 ml  Net -2078 ml   Filed Weights   11/02/18 0500 11/04/18 0500 11/05/18 0500  Weight: 120.3 kg 118 kg 117.1 kg    Examination:  General:67 y.o.femaleresting in bed in NAD Cardiovascular: RRR, +S1, S2, no m/g/r, equal pulses throughout Respiratory: CTABL, no w/r/r,some nasal congestion, decreased sounds at bases GI: BS+, NDNT, no masses noted, no organomegaly noted, morbidly obese MSK: No c/c; BLE edema Skin: No rashes, bruises, ulcerations noted Neuro: A&O x 3, no focal deficits    Data Reviewed: I have personally reviewed following labs and imaging studies.  CBC: Recent Labs  Lab 11/01/18 0710 11/04/18 0338 11/05/18 0553  WBC 4.0 4.4 5.2  NEUTROABS  --   --  3.7  HGB 8.5* 8.7* 8.7*  HCT 28.8* 29.8* 29.5*  MCV 88.3 89.2 89.1  PLT 163 147* 161*   Basic Metabolic Panel: Recent Labs  Lab 10/30/18 0311  10/31/18 0253  11/01/18 0710  11/02/18 0703 11/02/18 1622 11/03/18 0700 11/03/18 1212 11/04/18 0338 11/05/18 0553  NA 139  --  140  --  139  --  142  --  140  --  139 138  K 4.0   < > 3.6   < > 3.5   < > 3.5 3.9 3.7  --  3.6 4.0  CL 97*  --  94*  --  89*  --  85*  --  84*  --  89* 92*  CO2 31  --  33*  --  39*  --  44*  --  45*  --  42* 36*  GLUCOSE 164*  --  167*  --  149*  --  145*  --  132*  --  169* 158*  BUN 86*  --  79*  --  69*  --  59*  --  51*  --  42* 35*  CREATININE 2.56*  --  2.05*  --  1.90*  --  1.78*  --  1.59*  --  1.42* 1.35*  CALCIUM 8.8*  --   8.8*  --  8.8*  --  9.0  --  9.0  --  8.8* 8.9  MG 2.7*  --  2.3  --   --   --  1.9  --   --  1.8  --  1.9  PHOS 4.7*  --  3.9  --   --   --   --   --   --   --   --   --    < > = values in this interval not displayed.   GFR: Estimated Creatinine Clearance: 46.9 mL/min (A) (by C-G formula based on SCr of 1.35 mg/dL (H)). Liver Function Tests: No results for input(s): AST, ALT, ALKPHOS, BILITOT, PROT, ALBUMIN in the last 168 hours. No results for  input(s): LIPASE, AMYLASE in the last 168 hours. No results for input(s): AMMONIA in the last 168 hours. Coagulation Profile: No results for input(s): INR, PROTIME in the last 168 hours. Cardiac Enzymes: No results for input(s): CKTOTAL, CKMB, CKMBINDEX, TROPONINI in the last 168 hours. BNP (last 3 results) No results for input(s): PROBNP in the last 8760 hours. HbA1C: No results for input(s): HGBA1C in the last 72 hours. CBG: Recent Labs  Lab 11/04/18 0609 11/04/18 1112 11/04/18 1604 11/04/18 2048 11/05/18 0607  GLUCAP 150* 202* 183* 189* 153*   Lipid Profile: No results for input(s): CHOL, HDL, LDLCALC, TRIG, CHOLHDL, LDLDIRECT in the last 72 hours. Thyroid Function Tests: No results for input(s): TSH, T4TOTAL, FREET4, T3FREE, THYROIDAB in the last 72 hours. Anemia Panel: No results for input(s): VITAMINB12, FOLATE, FERRITIN, TIBC, IRON, RETICCTPCT in the last 72 hours. Sepsis Labs: No results for input(s): PROCALCITON, LATICACIDVEN in the last 168 hours.  Recent Results (from the past 240 hour(s))  SARS Coronavirus 2 (CEPHEID- Performed in [place] hospital lab), Hosp Order     Status: None   Collection Time: 10/27/18  4:05 AM   Specimen: Nasopharyngeal Swab  Result Value Ref Range Status   SARS Coronavirus 2 NEGATIVE NEGATIVE Final    Comment: (NOTE) If result is NEGATIVE SARS-CoV-2 target nucleic acids are NOT DETECTED. The SARS-CoV-2 RNA is generally detectable in upper and lower  respiratory specimens during the  acute phase of infection. The lowest  concentration of SARS-CoV-2 viral copies this assay can detect is 250  copies / mL. A negative result does not preclude SARS-CoV-2 infection  and should not be used as the sole basis for treatment or other  patient management decisions.  A negative result may occur with  improper specimen collection / handling, submission of specimen other  than nasopharyngeal swab, presence of viral mutation(s) within the  areas targeted by this assay, and inadequate number of viral copies  (<250 copies / mL). A negative result must be combined with clinical  observations, patient history, and epidemiological information. If result is POSITIVE SARS-CoV-2 target nucleic acids are DETECTED. The SARS-CoV-2 RNA is generally detectable in upper and lower  respiratory specimens dur ing the acute phase of infection.  Positive  results are indicative of active infection with SARS-CoV-2.  Clinical  correlation with patient history and other diagnostic information is  necessary to determine patient infection status.  Positive results do  not rule out bacterial infection or co-infection with other viruses. If result is PRESUMPTIVE POSTIVE SARS-CoV-2 nucleic acids MAY BE PRESENT.   A presumptive positive result was obtained on the submitted specimen  and confirmed on repeat testing.  While 2019 novel coronavirus  (SARS-CoV-2) nucleic acids may be present in the submitted sample  additional confirmatory testing may be necessary for epidemiological  and / or clinical management purposes  to differentiate between  SARS-CoV-2 and other Sarbecovirus currently known to infect humans.  If clinically indicated additional testing with an alternate test  methodology [phone]) is advised. The SARS-CoV-2 RNA is generally  detectable in upper and lower respiratory sp ecimens during the acute  phase of infection. The expected result is Negative. Fact Sheet for Patients:   StrictlyIdeas.no Fact Sheet for Healthcare Providers: BankingDealers.co.za This test is not yet approved or cleared by the [place] FDA and has been authorized for detection and/or diagnosis of SARS-CoV-2 by FDA under an Emergency Use Authorization (EUA).  This EUA will remain in effect (meaning this test can be used)  for the duration of the COVID-19 declaration under Section 564(b)(1) of the Act, 21 U.S.C. section 360bbb-3(b)(1), unless the authorization is terminated or revoked sooner. Performed at [facility], [place] [address], [place], [place] [address]   MRSA PCR Screening     Status: Abnormal   Collection Time: 10/28/18 12:08 AM   Specimen: Nasopharyngeal  Result Value Ref Range Status   MRSA by PCR POSITIVE (A) NEGATIVE Final    Comment:        The GeneXpert MRSA Assay (FDA approved for NASAL specimens only), is one component of a comprehensive MRSA colonization surveillance program. It is not intended to diagnose MRSA infection nor to guide or monitor treatment for MRSA infections. MCDUFFIE,E RN [phone] 10/28/2018 MITCHELL,L Performed at [facility] [address], [place], [place] [address]          Radiology Studies: Dg Chest 2 View  Result Date: 11/04/2018 CLINICAL DATA:  CHF.  History of hypertension. EXAM: CHEST - 2 VIEW COMPARISON:  Chest x-ray 10/31/2018 FINDINGS: The right subclavian power port is stable. Very low lung volumes with vascular crowding and atelectasis. There is moderate vascular congestion without overt pulmonary edema. Stable elevation right hemidiaphragm and overlying small pleural effusion. Persistent streaky areas of subsegmental atelectasis. IMPRESSION: Cardiac enlargement and vascular congestion without overt pulmonary edema. Elevation right hemidiaphragm with overlying small effusion. Persistent areas of subsegmental atelectasis. Electronically Signed   By: Marijo Sanes M.D.   On:  11/04/2018 15:52   Nm Pulmonary Perfusion  Result Date: 11/04/2018 CLINICAL DATA:  Pulmonary hypertension.  Shortness of breath. EXAM: NUCLEAR MEDICINE PERFUSION LUNG SCAN TECHNIQUE: Perfusion images were obtained in multiple projections after intravenous injection of radiopharmaceutical. Ventilation scans intentionally deferred if perfusion scan and chest x-ray adequate for interpretation during COVID 19 epidemic. The patient declined further as she was unable to lie flat. RADIOPHARMACEUTICALS:  1.5 mCi Tc-59mMAA IV COMPARISON:  Chest radiograph 10/31/2018. FINDINGS: Single moderate size perfusion defect is identified within the left midlung. This corresponds to an area of subsegmental atelectasis or scarring noted on previous chest radiograph. Asymmetric elevation of right hemidiaphragm is noted corresponding to the chest radiograph. IMPRESSION: 1. There is a single moderate size perfusion defect within the left midlung. Although indeterminate a corresponding area of scar/subsegmental atelectasis is noted within the left midlung. Given the absence of ventilation images, modified PIOPED criteria do not apply. No additional perfusion defects identified to suggest multiple pulmonary emboli. Electronically Signed   By: TKerby MoorsM.D.   On: 11/04/2018 15:50   Ct Chest High Resolution  Result Date: 11/04/2018 CLINICAL DATA:  Pulmonary hypertension suspected EXAM: CT CHEST WITHOUT CONTRAST TECHNIQUE: Multidetector CT imaging of the chest was performed following the standard protocol without intravenous contrast. High resolution imaging of the lungs, as well as inspiratory and expiratory imaging, was performed. COMPARISON:  CT chest, 10/24/2018 FINDINGS: Cardiovascular: Cardiomegaly. Left coronary artery calcifications. The main pulmonary artery is enlarged, measuring up to 4.2 cm in caliber. No pericardial effusion. Right subclavian port catheter. Mediastinum/Nodes: No enlarged mediastinal, hilar, or  axillary lymph nodes. Thyroid gland, trachea, and esophagus demonstrate no significant findings. Lungs/Pleura: Bibasilar consolidation or atelectasis, similar to prior examination and most conspicuous in the right lung base. Small pleural effusions, similar to prior examination. No significant air trapping on expiratory phase imaging. Upper Abdomen: No acute abnormality. Coarse contour of the liver. Trace perihepatic ascites. Splenomegaly. Musculoskeletal: Status post left mastectomy. There is a spiculated mass in the left axilla containing  a biopsy clip (series 4, image 22). IMPRESSION: 1. Bibasilar consolidation or atelectasis, similar to prior examination and most conspicuous in the right lung base. Small pleural effusions, similar to prior examination. No significant air trapping on expiratory phase imaging. No apparent evidence of fibrotic interstitial lung disease, however the presence of effusions and atelectasis significantly limits evaluation for subtle changes. 2. The main pulmonary artery is enlarged, measuring up to 4.2 cm in caliber, which can be seen in pulmonary hypertension. 3.  Coronary artery disease. 4. There is a spiculated mass in the left axilla containing a biopsy clip (series 4, image 22). Correlate with mammographic evaluation. 5. Coarse contour of the liver and splenomegaly, findings suggestive of hepatic cirrhosis. Electronically Signed   By: Eddie Candle M.D.   On: 11/04/2018 17:52        Scheduled Meds:  acetaZOLAMIDE  250 mg Oral QODAY   amiodarone  200 mg Oral Daily   aspirin EC  81 mg Oral Daily   atorvastatin  20 mg Oral Daily   Chlorhexidine Gluconate Cloth  6 each Topical Daily   heparin  5,000 Units Subcutaneous Q8H   insulin aspart  0-5 Units Subcutaneous QHS   insulin aspart  0-9 Units Subcutaneous TID WC   mouth rinse  15 mL Mouth Rinse BID   midodrine  10 mg Oral TID WC   mometasone-formoterol  2 puff Inhalation BID   potassium chloride  20 mEq  Oral BID   sodium chloride flush  3 mL Intravenous Q12H   torsemide  40 mg Oral Daily   Continuous Infusions:  sodium chloride Stopped (10/31/18 0830)   norepinephrine (LEVOPHED) Adult infusion Stopped (10/31/18 0826)     LOS: 9 days    Time spent: 25 minutes spent in the coordination of care today.    Jonnie Finner, DO [facility] Pager [phone]  If 7PM-7AM, please contact night-coverage [email or web address] Password [email or web address] 11/05/2018, 7:10 AM

## 2018-11-05 NOTE — Progress Notes (Signed)
Physical Therapy Treatment Patient Details Name: Tammy Mathews MRN: [id number] DOB: [birth year] Today's Date: 11/05/2018    History of Present Illness Tammy Mathews is a 68 y.o. female with medical history significant of breast cancer in remission, morbid obesity, hypertension, hyperlipidemia, type 2 diabetes, COPD and chronic hypoxic respiratory failure on 3 L home oxygen presented to [facility] with complains of left-sided chest pain, progressive shortness of breath, orthopnea, 50 pound weight gain over the last several months, and bilateral lower extremity edema.    PT Comments    Pt is looking forward to discharge tomorrow or the day after. Pt has been sitting up in chair all afternoon and reports being tired, wanting to return to bed. Pt declines ambulation due to fatigue and pain in bilateral feet with standing. Pt requires min Ax2 sit to stand and stepping transfer to bed. Once sitting EoB pt able to participate in therapeutic exercise before requiring min A for returning to supine in bed. PT will continue to follow acutely until d/c.   Follow Up Recommendations  SNF;Supervision/Assistance - 24 hour     Equipment Recommendations  None recommended by PT       Precautions / Restrictions Precautions Precautions: Fall Precaution Comments: morbidly obese Restrictions Weight Bearing Restrictions: No    Mobility  Bed Mobility Overal bed mobility: Needs Assistance Bed Mobility: Sit to Supine       Sit to supine: Min assist   General bed mobility comments: min A for management of LE into bed  Transfers Overall transfer level: Needs assistance Equipment used: 2 person hand held assist Transfers: Sit to/from Stand;Stand Pivot Transfers Sit to Stand: +2 safety/equipment;Min assist         General transfer comment: 2 person hand HHA for stepping transfer from recliner to bed after sitting up in recliner all afternoon  Ambulation/Gait             General Gait  Details: pt unable to ambulate due to pain in bilateral feet           Balance Overall balance assessment: Needs assistance Sitting-balance support: Feet supported;Bilateral upper extremity supported Sitting balance-Leahy Scale: Fair     Standing balance support: Bilateral upper extremity supported Standing balance-Leahy Scale: Poor Standing balance comment: requires bilateral UE support                            Cognition Arousal/Alertness: Awake/alert Behavior During Therapy: WFL for tasks assessed/performed Overall Cognitive Status: Within Functional Limits for tasks assessed                                        Exercises General Exercises - Lower Extremity Ankle Circles/Pumps: AROM;Both;10 reps;Supine Gluteal Sets: AROM;Both;10 reps;Seated Long Arc Quad: AROM;Both;10 reps;Seated Hip Flexion/Marching: AROM;Both;10 reps;Seated Toe Raises: AROM;Both;10 reps;Seated Heel Raises: AROM;Both;10 reps;Seated    General Comments General comments (skin integrity, edema, etc.): VSS, Pt with bilateral Unna boots       Pertinent Vitals/Pain Pain Assessment: Faces Faces Pain Scale: Hurts little more Pain Location: bilateral feet with weight bearing Pain Descriptors / Indicators: Aching;Sore(arthritic pain) Pain Intervention(s): Limited activity within patient's tolerance;Monitored during session;Repositioned           PT Goals (current goals can now be found in the care plan section) Acute Rehab PT Goals Patient Stated Goal: get back to walking PT Goal Formulation:  With patient Time For Goal Achievement: 11/12/18 Potential to Achieve Goals: Good Progress towards PT goals: Progressing toward goals    Frequency    Min 3X/week      PT Plan Current plan remains appropriate       AM-PAC PT "6 Clicks" Mobility   Outcome Measure  Help needed turning from your back to your side while in a flat bed without using bedrails?: A  Little Help needed moving from lying on your back to sitting on the side of a flat bed without using bedrails?: A Little Help needed moving to and from a bed to a chair (including a wheelchair)?: A Little Help needed standing up from a chair using your arms (e.g., wheelchair or bedside chair)?: A Little Help needed to walk in hospital room?: A Little Help needed climbing 3-5 steps with a railing? : A Lot 6 Click Score: 17    End of Session Equipment Utilized During Treatment: Gait belt;Oxygen Activity Tolerance: Patient tolerated treatment well Patient left: in chair;with call bell/phone within reach;with chair alarm set Nurse Communication: Mobility status PT Visit Diagnosis: Unsteadiness on feet (R26.81);Difficulty in walking, not elsewhere classified (R26.2)     Time: 8307-3543 PT Time Calculation (min) (ACUTE ONLY): 16 min  Charges:  $Gait Training: 8-22 mins                     Terence Bart B. Migdalia Dk PT, DPT Acute Rehabilitation Services Pager [phone] Office [phone]    [place] 11/05/2018, 4:34 PM

## 2018-11-05 NOTE — Progress Notes (Addendum)
Advanced Heart Failure Rounding Note  PCP-Cardiologist: No primary care provider on file.   Subjective:    Denies SOB. Denies chest pain.    Objective:   Weight Range: 117.1 kg Body mass index is 48.78 kg/m.   Vital Signs:   Temp:  [97.6 F (36.4 C)-98.7 F (37.1 C)] 98.7 F (37.1 C) (06/30 0720) Pulse Rate:  [75-90] 80 (06/30 0720) Resp:  [16-29] 22 (06/30 0720) BP: (89-136)/(48-86) 118/54 (06/30 0720) SpO2:  [93 %-100 %] 96 % (06/30 0720) Weight:  [117.1 kg] 117.1 kg (06/30 0500) Last BM Date: 11/03/18  Weight change: Filed Weights   11/02/18 0500 11/04/18 0500 11/05/18 0500  Weight: 120.3 kg 118 kg 117.1 kg    Intake/Output:   Intake/Output Summary (Last 24 hours) at 11/05/2018 0753 Last data filed at 11/05/2018 0721 Gross per 24 hour  Intake 222 ml  Output 2500 ml  Net -2278 ml      Physical Exam    General:  Appears chronically ill. No resp difficulty. R upper chest porta cath HEENT:normal except poor dentition Neck: Supple. JVP 5-6 . Carotids 2+ bilat; no bruits. No lymphadenopathy or thyromegaly appreciated.  Cor: PMI nondisplaced. Regular rate & rhythm. No rubs, gallops or murmurs. Lungs: Decreased in the bases on 2 liters  Abdomen: Soft, nontender, nondistended. No hepatosplenomegaly. No bruits or masses. Good bowel sounds. Extremities: No cyanosis, clubbing, rash, R and LLE unnal boots.  Neuro: Alert & orientedx3, cranial nerves grossly intact. moves all 4 extremities w/o difficulty. Affect pleasant   Telemetry   NSR 80s   EKG    N/A  Labs    CBC Recent Labs    11/04/18 0338 11/05/18 0553  WBC 4.4 5.2  NEUTROABS  --  3.7  HGB 8.7* 8.7*  HCT 29.8* 29.5*  MCV 89.2 89.1  PLT 147* 483*   Basic Metabolic Panel Recent Labs    11/03/18 1212 11/04/18 0338 11/05/18 0553  NA  --  139 138  K  --  3.6 4.0  CL  --  89* 92*  CO2  --  42* 36*  GLUCOSE  --  169* 158*  BUN  --  42* 35*  CREATININE  --  1.42* 1.35*  CALCIUM  --   8.8* 8.9  MG 1.8  --  1.9   Liver Function Tests No results for input(s): AST, ALT, ALKPHOS, BILITOT, PROT, ALBUMIN in the last 72 hours. No results for input(s): LIPASE, AMYLASE in the last 72 hours. Cardiac Enzymes No results for input(s): CKTOTAL, CKMB, CKMBINDEX, TROPONINI in the last 72 hours.  BNP: BNP (last 3 results) Recent Labs    10/27/18 2210  BNP 778.0*    ProBNP (last 3 results) No results for input(s): PROBNP in the last 8760 hours.   D-Dimer No results for input(s): DDIMER in the last 72 hours. Hemoglobin A1C No results for input(s): HGBA1C in the last 72 hours. Fasting Lipid Panel No results for input(s): CHOL, HDL, LDLCALC, TRIG, CHOLHDL, LDLDIRECT in the last 72 hours. Thyroid Function Tests No results for input(s): TSH, T4TOTAL, T3FREE, THYROIDAB in the last 72 hours.  Invalid input(s): FREET3  Other results:   Imaging    Dg Chest 2 View  Result Date: 11/04/2018 CLINICAL DATA:  CHF.  History of hypertension. EXAM: CHEST - 2 VIEW COMPARISON:  Chest x-ray 10/31/2018 FINDINGS: The right subclavian power port is stable. Very low lung volumes with vascular crowding and atelectasis. There is moderate vascular congestion without overt pulmonary  edema. Stable elevation right hemidiaphragm and overlying small pleural effusion. Persistent streaky areas of subsegmental atelectasis. IMPRESSION: Cardiac enlargement and vascular congestion without overt pulmonary edema. Elevation right hemidiaphragm with overlying small effusion. Persistent areas of subsegmental atelectasis. Electronically Signed   By: P.  Gallerani M.D.   On: 11/04/2018 15:52  ° °Nm Pulmonary Perfusion ° °Result Date: 11/04/2018 °CLINICAL DATA:  Pulmonary hypertension.  Shortness of breath. EXAM: NUCLEAR MEDICINE PERFUSION LUNG SCAN TECHNIQUE: Perfusion images were obtained in multiple projections after intravenous injection of radiopharmaceutical. Ventilation scans intentionally deferred if perfusion  scan and chest x-ray adequate for interpretation during COVID 19 epidemic. The patient declined further as she was unable to lie flat. RADIOPHARMACEUTICALS:  1.5 mCi Tc-99m MAA IV COMPARISON:  Chest radiograph 10/31/2018. FINDINGS: Single moderate size perfusion defect is identified within the left midlung. This corresponds to an area of subsegmental atelectasis or scarring noted on previous chest radiograph. Asymmetric elevation of right hemidiaphragm is noted corresponding to the chest radiograph. IMPRESSION: 1. There is a single moderate size perfusion defect within the left midlung. Although indeterminate a corresponding area of scar/subsegmental atelectasis is noted within the left midlung. Given the absence of ventilation images, modified PIOPED criteria do not apply. No additional perfusion defects identified to suggest multiple pulmonary emboli. Electronically Signed   By: Taylor  Stroud M.D.   On: 11/04/2018 15:50  ° °Ct Chest High Resolution ° °Result Date: 11/04/2018 °CLINICAL DATA:  Pulmonary hypertension suspected EXAM: CT CHEST WITHOUT CONTRAST TECHNIQUE: Multidetector CT imaging of the chest was performed following the standard protocol without intravenous contrast. High resolution imaging of the lungs, as well as inspiratory and expiratory imaging, was performed. COMPARISON:  CT chest, 10/24/2018 FINDINGS: Cardiovascular: Cardiomegaly. Left coronary artery calcifications. The main pulmonary artery is enlarged, measuring up to 4.2 cm in caliber. No pericardial effusion. Right subclavian port catheter. Mediastinum/Nodes: No enlarged mediastinal, hilar, or axillary lymph nodes. Thyroid gland, trachea, and esophagus demonstrate no significant findings. Lungs/Pleura: Bibasilar consolidation or atelectasis, similar to prior examination and most conspicuous in the right lung base. Small pleural effusions, similar to prior examination. No significant air trapping on expiratory phase imaging. Upper Abdomen: No  acute abnormality. Coarse contour of the liver. Trace perihepatic ascites. Splenomegaly. Musculoskeletal: Status post left mastectomy. There is a spiculated mass in the left axilla containing a biopsy clip (series 4, image 22). IMPRESSION: 1. Bibasilar consolidation or atelectasis, similar to prior examination and most conspicuous in the right lung base. Small pleural effusions, similar to prior examination. No significant air trapping on expiratory phase imaging. No apparent evidence of fibrotic interstitial lung disease, however the presence of effusions and atelectasis significantly limits evaluation for subtle changes. 2. The main pulmonary artery is enlarged, measuring up to 4.2 cm in caliber, which can be seen in pulmonary hypertension. 3.  Coronary artery disease. 4. There is a spiculated mass in the left axilla containing a biopsy clip (series 4, image 22). Correlate with mammographic evaluation. 5. Coarse contour of the liver and splenomegaly, findings suggestive of hepatic cirrhosis. Electronically Signed   By: Alex  Bibbey M.D.   On: 11/04/2018 17:52  °·  ° ° °Medications:   ° ° °Scheduled Medications: °• acetaZOLAMIDE  250 mg Oral QODAY  °• amiodarone  200 mg Oral Daily  °• aspirin EC  81 mg Oral Daily  °• atorvastatin  20 mg Oral Daily  °• Chlorhexidine Gluconate Cloth  6 each Topical Daily  °• heparin  5,000 Units Subcutaneous Q8H  °• insulin aspart    0-5 Units Subcutaneous QHS  °• insulin aspart  0-9 Units Subcutaneous TID WC  °• mouth rinse  15 mL Mouth Rinse BID  °• midodrine  10 mg Oral TID WC  °• mometasone-formoterol  2 puff Inhalation BID  °• potassium chloride  20 mEq Oral BID  °• sodium chloride flush  3 mL Intravenous Q12H  °• torsemide  40 mg Oral Daily  °·  ° °Infusions: °• sodium chloride Stopped (10/31/18 0830)  °• norepinephrine (LEVOPHED) Adult infusion Stopped (10/31/18 0826)  °·  ° °PRN Medications: °· sodium chloride, acetaminophen **OR** acetaminophen, ipratropium-albuterol,  meclizine, ondansetron, polyvinyl alcohol, sodium chloride flush ° ° °Assessment/Plan  °1. A/C Respiratory Failure  °-On 3 liters chronically at home.  °- CTA with bibasilar atelectasis. Add incentive spirometer.  °-VQ - negative for PE °- Initially on bipap but later weaned with diuresis. O2 sats stable on 2 liters oxygen.  °  °2. AKI on CKD °Nephrology consulted on admit. Creatinine on admit 3.7.  °-Todays creatinine is 1.3.  °- Stable.   °  °3. RV Failure  °10/28/2018 Moderately reduced RV on ECHO.  °Diuresed with lasix drip + metolazone. Diuresed > 50 pounds.  °CTA - Enlarged pulmonary artery, bibasilar atelectasis, RLL , CAD,  °- Volume status stable. Continue torsemide 40 mg daily but adjust based on RHC.   °- Add 12.5 mg spironolactone daily.  °  °4. Pulmonary HTN °Severely Elevated PA pressures on ECHO. °VQ - I personally called and discussed with Dr Edmaonds. He does not think this appears to be PE. He did not recommend additional testing.  °  CTA - Enlarged pulmonary artery, bibasilar atelectasis in RLL. No evidence of interstitial lung disease.  °Serology labs pending: RF , SCL, centromere, ANCA, ANA °-RHC set for 7/1 for further assess.  °-Suspect obesity/OSA major factor. Intolerant CPAP and has been refusing.   °  °5.  DMII °  °6. Obesity °.Body mass index is 48.78 kg/m². °  °7. Anemia °Iron sats 8 on 6/24, received feraheme on 6/25  °Hgb 8.7 Stable today.  °No obvious source of bleeding.  °  °8. Severe Deconditioning °PT following. Planning for SNF ° °9. H/O Breast Cancer  °S/P L mastectomy. Followed every 3 months by Dr McCarty in [place] °CTA -spiculated mass in the left axilla containing a biopsy clip (series 4, image 22). Correlate with mammographic evaluation. ° °10. PAF °Maintaining NSR. Cut amio 100 mg daily.  °CTA - No interstitial lung disease.  ° ° °Length of Stay: 9 ° °Amy Clegg, NP  °11/05/2018, 7:53 AM ° °Advanced Heart Failure Team °Pager [phone] (M-F; 7a - 4p)  °Please contact [facility] for night-coverage after hours (4p -7a ) and weekends on [email or web address] ° °Patient seen with NP, agree with the above note.  ° °Weight down another 2 lbs and I/Os negative on torsemide 40 mg daily. Oxygen down to 2 L by nasal cannula.  Has not been out of bed much.  ° °General: NAD °Neck: JVP 9-10 cm, no thyromegaly or thyroid nodule.  °Lungs: Decreased BS at bases.  °CV: Nonpalpable PMI.  Heart regular S1/S2, no S3/S4, 1/6 SEM RUSB.  No edema, legs wrapped.   °Abdomen: Soft, nontender, no hepatosplenomegaly, no distention.  °Skin: Intact without lesions or rashes.  °Neurologic: Alert and oriented x 3.  °Psych: Normal affect. °Extremities: No clubbing or cyanosis.  °HEENT: Normal.  ° °1. Acute on chronic hypoxemic respiratory failure: She has been labeled as having COPD but never smoked.  Based on body habitus, OHS/OSA   is a possibility.  She is on 3 L home oxygen.  She is unable to tolerate CPAP.  2. Acute on chronic diastolic CHF with prominent RV failure: Echo (6/20) with EF 60-65%, severely dilated RV with moderately decreased RV systolic function, moderate TR, PASP 85 mmHg. She was markedly volume overloaded on exam and has diuresed well.  Suspect by exam she has some ongoing volume overload. Good diuresis with torsemide yesterday, weight still coming down.  - Continue torsemide 40 mg daily with KCl 20 mEq bid.  - Midodrine being used to maintain BP, will try decreasing to 5 mg tid.  - needs RHC (see below).  3. Atrial fibrillation: Paroxysmal.  She has only been in NSR here.  Apparently she was on anticoagulation (not sure what) a while ago and had severe epistaxis.  She stopped anticoagulation and does not want to restart. She is on ASA currently.  - Continue amiodarone to try to maintain NSR. I decreased amiodarone to 100 mg daily.  May be able to stop in the future. CT chest is not suggestive of amiodarone lung toxicity.  4. Pulmonary hypertension: Severe pulmonary hypertension by echo with RV  dysfunction.  Etiology uncertain.  She has chronic hypoxemic respiratory failure.  This certainly could be due to long-standing pulmonary hypertension, but also possibly due to OSH/OSA.  She has never smoked so COPD unlikely. High resolution CT chest did not show evidence for ILD, there was bibasilar atelectasis.  V/Q scan with perfusion defect likely due to atelectasis. Discussed with radiology, unlikely PE and do not recommend further workup.  - She will need RHC.  Will plan for Wednesday morning.  Discussed risks/benefits with patient and she agrees to procedure.  - Send pulmonary hypertension serologic workup: ANA, RF, anti-SCL70, anti-centromere.  - It is possible that [place] is group 3 due to lung disease (OHS/OSA or other).  However, if it looks out of proportion to degree of pulmonary disease, pulmonary vasodilator trial would be reasonable.  5. AKI on CKD stage 3: Creatinine back down to 1.35.  Follow closely.  6. Mass in left axilla: She will need followup with her oncologist after discharge.   Plan for SNF in [place].   Loralie Champagne 11/05/2018 10:05 AM

## 2018-11-05 NOTE — Consult Note (Signed)
   [facility] CM Inpatient Consult   11/05/2018  Tammy Mathews 1950/06/22 [id number]   Patient screened for high risk score for unplanned readmission score  With long length of stay hospitalization and request from insurance [[employer] [place] follow up.  Review of patient's medical record reveals patient is currently being recommended for a skilled nursing facility stay. Patient recently transferred out of ICU [11/03/2018] now at progressive status.  Chart review from narrative reveals from Dr.Tyrone Marylyn Ishihara progress notes 11/04/2018 as follows:  [name], transferred from another hospital to [facility] due to concerns for worsening renal function and shortness of breath. HX includes breast cancer, morbid obesity, hypertension, hyperlipidemia, diabetes mellitus, chronic kidney disease and COPD on 3 L of oxygen. Patient was reported to have left-sided chest pain, shortness of breath, dyspnea on exertion, orthopnea, leg edema and 50 pound weight gain in the last month.  Plan: Follow for disposition.  If patient admits to a skilled facility, [facility] will sign off.    Please place a [facility] consult as appropriate if patient transitions home and for questions contact:   Natividad Brood, RN BSN [facility] Liaison  [phone] business mobile phone Toll free office [phone]  Fax number: [phone] [email or web address]

## 2018-11-06 ENCOUNTER — Encounter (HOSPITAL_COMMUNITY)
Admission: AD | Disposition: A | Discharge status: Skilled Nursing Facility | Payer: Medicare Other | Source: Other Acute Inpatient Hospital | Attending: Internal Medicine

## 2018-11-06 ENCOUNTER — Other Ambulatory Visit: Payer: Self-pay | Admitting: *Deleted

## 2018-11-06 ENCOUNTER — Encounter (HOSPITAL_COMMUNITY): Payer: Self-pay | Admitting: Cardiology

## 2018-11-06 DIAGNOSIS — I509 Heart failure, unspecified: Secondary | ICD-10-CM

## 2018-11-06 DIAGNOSIS — K0530 Chronic periodontitis, unspecified: Secondary | ICD-10-CM

## 2018-11-06 DIAGNOSIS — I50810 Right heart failure, unspecified: Secondary | ICD-10-CM

## 2018-11-06 DIAGNOSIS — I480 Paroxysmal atrial fibrillation: Secondary | ICD-10-CM

## 2018-11-06 DIAGNOSIS — I272 Pulmonary hypertension, unspecified: Secondary | ICD-10-CM

## 2018-11-06 HISTORY — PX: RIGHT HEART CATH: CATH118263

## 2018-11-06 LAB — RENAL FUNCTION PANEL
Albumin: 2.8 g/dL — ABNORMAL LOW (ref 3.5–5.0)
Anion gap: 11 (ref 5–15)
BUN: 32 mg/dL — ABNORMAL HIGH (ref 8–23)
CO2: 34 mmol/L — ABNORMAL HIGH (ref 22–32)
Calcium: 8.9 mg/dL (ref 8.9–10.3)
Chloride: 92 mmol/L — ABNORMAL LOW (ref 98–111)
Creatinine, Ser: 1.3 mg/dL — ABNORMAL HIGH (ref 0.44–1.00)
GFR calc Af Amer: 48 mL/min — ABNORMAL LOW (ref >60)
GFR calc non Af Amer: 42 mL/min — ABNORMAL LOW (ref >60)
Glucose, Bld: 161 mg/dL — ABNORMAL HIGH (ref 70–99)
Phosphorus: 2.3 mg/dL — ABNORMAL LOW (ref 2.5–4.6)
Potassium: 4.2 mmol/L (ref 3.5–5.1)
Sodium: 137 mmol/L (ref 135–145)

## 2018-11-06 LAB — GLUCOSE, CAPILLARY
Glucose-Capillary: 158 mg/dL — ABNORMAL HIGH (ref 70–99)
Glucose-Capillary: 144 mg/dL — ABNORMAL HIGH (ref 70–99)
Glucose-Capillary: 161 mg/dL — ABNORMAL HIGH (ref 70–99)
Glucose-Capillary: 166 mg/dL — ABNORMAL HIGH (ref 70–99)

## 2018-11-06 LAB — POCT I-STAT EG7
Acid-Base Excess: 10 mmol/L — ABNORMAL HIGH (ref 0.0–2.0)
Acid-Base Excess: 11 mmol/L — ABNORMAL HIGH (ref 0.0–2.0)
Bicarbonate: 37.1 mmol/L — ABNORMAL HIGH (ref 20.0–28.0)
Bicarbonate: 37.7 mmol/L — ABNORMAL HIGH (ref 20.0–28.0)
Calcium, Ion: 1.16 mmol/L (ref 1.15–1.40)
Calcium, Ion: 1.22 mmol/L (ref 1.15–1.40)
HCT: 32 % — ABNORMAL LOW (ref 36.0–46.0)
HCT: 33 % — ABNORMAL LOW (ref 36.0–46.0)
Hemoglobin: 10.9 g/dL — ABNORMAL LOW (ref 12.0–15.0)
Hemoglobin: 11.2 g/dL — ABNORMAL LOW (ref 12.0–15.0)
O2 Saturation: 67 %
O2 Saturation: 69 %
Potassium: 4 mmol/L (ref 3.5–5.1)
Potassium: 4.1 mmol/L (ref 3.5–5.1)
Sodium: 139 mmol/L (ref 135–145)
Sodium: 141 mmol/L (ref 135–145)
TCO2: 39 mmol/L — ABNORMAL HIGH (ref 22–32)
TCO2: 40 mmol/L — ABNORMAL HIGH (ref 22–32)
pCO2, Ven: 60.4 mmHg — ABNORMAL HIGH (ref 44.0–60.0)
pCO2, Ven: 60.9 mmHg — ABNORMAL HIGH (ref 44.0–60.0)
pH, Ven: 7.396 (ref 7.250–7.430)
pH, Ven: 7.4 (ref 7.250–7.430)
pO2, Ven: 36 mmHg (ref 32.0–45.0)
pO2, Ven: 37 mmHg (ref 32.0–45.0)

## 2018-11-06 LAB — CBC WITH DIFFERENTIAL/PLATELET
Abs Immature Granulocytes: 0.04 10*3/uL (ref 0.00–0.07)
Basophils Absolute: 0 10*3/uL (ref 0.0–0.1)
Basophils Relative: 1 %
Eosinophils Absolute: 0.1 10*3/uL (ref 0.0–0.5)
Eosinophils Relative: 3 %
HCT: 30.8 % — ABNORMAL LOW (ref 36.0–46.0)
Hemoglobin: 9.2 g/dL — ABNORMAL LOW (ref 12.0–15.0)
Immature Granulocytes: 1 %
Lymphocytes Relative: 16 %
Lymphs Abs: 0.8 10*3/uL (ref 0.7–4.0)
MCH: 26.6 pg (ref 26.0–34.0)
MCHC: 29.9 g/dL — ABNORMAL LOW (ref 30.0–36.0)
MCV: 89 fL (ref 80.0–100.0)
Monocytes Absolute: 0.5 10*3/uL (ref 0.1–1.0)
Monocytes Relative: 9 %
Neutro Abs: 3.5 10*3/uL (ref 1.7–7.7)
Neutrophils Relative %: 70 %
Platelets: 161 10*3/uL (ref 150–400)
RBC: 3.46 MIL/uL — ABNORMAL LOW (ref 3.87–5.11)
RDW: 16.2 % — ABNORMAL HIGH (ref 11.5–15.5)
WBC: 4.9 10*3/uL (ref 4.0–10.5)
nRBC: 0 % (ref 0.0–0.2)

## 2018-11-06 LAB — NOVEL CORONAVIRUS, NAA (HOSP ORDER, SEND-OUT TO REF LAB; TAT 18-24 HRS): SARS-CoV-2, NAA: NOT DETECTED

## 2018-11-06 LAB — RHEUMATOID FACTOR: Rheumatoid fact SerPl-aCnc: <10 IU/mL (ref 0.0–13.9)

## 2018-11-06 LAB — ANTI-SCLERODERMA ANTIBODY: Scleroderma (Scl-70) (ENA) Antibody, IgG: <0.2 AI (ref 0.0–0.9)

## 2018-11-06 LAB — MAGNESIUM: Magnesium: 2.1 mg/dL (ref 1.7–2.4)

## 2018-11-06 LAB — ANCA TITERS
Atypical P-ANCA titer: <1:20 {titer}
C-ANCA: <1:20 {titer}
P-ANCA: <1:20 {titer}

## 2018-11-06 LAB — ANA W/REFLEX IF POSITIVE: Anti Nuclear Antibody (ANA): NEGATIVE

## 2018-11-06 LAB — CENTROMERE ANTIBODIES: Centromere Ab Screen: <0.2 AI (ref 0.0–0.9)

## 2018-11-06 SURGERY — RIGHT HEART CATH
Anesthesia: LOCAL

## 2018-11-06 MED ORDER — LIDOCAINE HCL (PF) 1 % IJ SOLN
INTRAMUSCULAR | Status: DC | PRN
  Administered 2018-11-06: 5 mL

## 2018-11-06 MED ORDER — LIDOCAINE HCL (PF) 1 % IJ SOLN
INTRAMUSCULAR | Status: AC
  Filled 2018-11-06: qty 30

## 2018-11-06 MED ORDER — HEPARIN (PORCINE) IN NACL 1000-0.9 UT/500ML-% IV SOLN
INTRAVENOUS | Status: DC | PRN
  Administered 2018-11-06: 500 mL

## 2018-11-06 MED ORDER — HEPARIN (PORCINE) IN NACL 1000-0.9 UT/500ML-% IV SOLN
INTRAVENOUS | Status: AC
  Filled 2018-11-06: qty 500

## 2018-11-06 MED ORDER — FUROSEMIDE 10 MG/ML IJ SOLN
80.0000 mg | Freq: Two times a day (BID) | INTRAMUSCULAR | Status: AC
  Administered 2018-11-06 – 2018-11-07 (×4): 80 mg via INTRAVENOUS
  Filled 2018-11-06 (×4): qty 8

## 2018-11-06 SURGICAL SUPPLY — 10 items
CATH BALLN WEDGE 5F 110CM (CATHETERS) ×2 IMPLANT
COVER DOME SNAP 22 D (MISCELLANEOUS) ×6 IMPLANT
GUIDEWIRE .025 260CM (WIRE) ×2 IMPLANT
KIT HEART LEFT (KITS) ×2 IMPLANT
PACK CARDIAC CATHETERIZATION (CUSTOM PROCEDURE TRAY) ×2 IMPLANT
PROTECTION STATION PRESSURIZED (MISCELLANEOUS) ×2
SHEATH GLIDE SLENDER 4/5FR (SHEATH) ×2 IMPLANT
STATION PROTECTION PRESSURIZED (MISCELLANEOUS) ×1 IMPLANT
TRANSDUCER W/STOPCOCK (MISCELLANEOUS) ×2 IMPLANT
TUBING CIL FLEX 10 FLL-RA (TUBING) ×2 IMPLANT

## 2018-11-06 NOTE — Interval H&P Note (Signed)
History and Physical Interval Note:  11/06/2018 8:48 AM  Tammy Mathews  has presented today for surgery, with the diagnosis of heart failure.  The various methods of treatment have been discussed with the patient and family. After consideration of risks, benefits and other options for treatment, the patient has consented to  Procedure(s): RIGHT HEART CATH (N/A) as a surgical intervention.  The patient's history has been reviewed, patient examined, no change in status, stable for surgery.  I have reviewed the patient's chart and labs.  Questions were answered to the patient's satisfaction.     Tammy Mathews [employer]

## 2018-11-06 NOTE — Progress Notes (Signed)
Orthopedic Tech Progress Note Patient Details:  Tammy Mathews 25-Dec-1950 [id number]  Ortho Devices Type of Ortho Device: Haematologist Ortho Device/Splint Location: bi-lateral Ortho Device/Splint Interventions: Ordered, Application, Adjustment   Post Interventions Patient Tolerated: Well Instructions Provided: Care of device, Adjustment of device   [name] 11/06/2018, 5:34 AM

## 2018-11-06 NOTE — Patient Outreach (Signed)
Referral received from [employer] high risk list, noted that pt admitted 10/27/18 for fluid overload, acute respiratory distress, note from hospital liason is plan for skilled nursing facility and pt has been admitted greater than 10 days.   Case closed  Jacqlyn Larsen [facility], [place] Coordinator [phone]

## 2018-11-06 NOTE — Progress Notes (Signed)
Patient ID: Tammy Mathews, female   DOB: 06-20-1950, 68 y.o.   MRN: [id number]     Advanced Heart Failure Rounding Note  PCP-Cardiologist: No primary care provider on file.   Subjective:    Some diuresis on po torsemide, weight down again.  BUN/creatinine continue to trend down.   She has not been out of bed much, she has ongoing orthopnea.   RHC Procedural Findings (11/06/18): Hemodynamics (mmHg) RA mean 14 RV 77/16 PA 80/34, mean 53 PCWP mean 21 Oxygen saturations: PA 68% AO 96% Cardiac Output (Fick) 7.96  Cardiac Index (Fick) 3.8 PVR 4 WU   Objective:   Weight Range: 115.7 kg Body mass index is 48.2 kg/m.   Vital Signs:   Temp:  [97.8 F (36.6 C)-98.3 F (36.8 C)] 98.2 F (36.8 C) (07/01 0700) Pulse Rate:  [75-88] 88 (07/01 0700) Resp:  [23-25] 23 (07/01 0700) BP: (123-137)/(42-66) 137/66 (07/01 0700) SpO2:  [95 %-100 %] 96 % (07/01 0856) Weight:  [115.7 kg] 115.7 kg (07/01 0300) Last BM Date: 11/03/18  Weight change: Filed Weights   11/04/18 0500 11/05/18 0500 11/06/18 0300  Weight: 118 kg 117.1 kg 115.7 kg    Intake/Output:   Intake/Output Summary (Last 24 hours) at 11/06/2018 0923 Last data filed at 11/06/2018 0813 Gross per 24 hour  Intake 120 ml  Output 2400 ml  Net -2280 ml      Physical Exam    General: NAD, appears chronically ill Neck: JVP 12 cm, no thyromegaly or thyroid nodule.  Lungs: Decreased BS at bases.  CV: Nondisplaced PMI.  Heart regular S1/S2, no S3/S4, no murmur.  1+ ankle edema.   Abdomen: Soft, nontender, no hepatosplenomegaly, no distention.  Skin: Intact without lesions or rashes.  Neurologic: Alert and oriented x 3.  Psych: Normal affect. Extremities: No clubbing or cyanosis.  HEENT: Normal.   Telemetry   NSR 80s (personally reviewed)  EKG    N/A  Labs    CBC Recent Labs    11/05/18 0553 11/06/18 0401  WBC 5.2 4.9  NEUTROABS 3.7 3.5  HGB 8.7* 9.2*  HCT 29.5* 30.8*  MCV 89.1 89.0  PLT 145* 528    Basic Metabolic Panel Recent Labs    11/05/18 0553 11/06/18 0401  NA 138 137  K 4.0 4.2  CL 92* 92*  CO2 36* 34*  GLUCOSE 158* 161*  BUN 35* 32*  CREATININE 1.35* 1.30*  CALCIUM 8.9 8.9  MG 1.9 2.1  PHOS  --  2.3*   Liver Function Tests Recent Labs    11/06/18 0401  ALBUMIN 2.8*   No results for input(s): LIPASE, AMYLASE in the last 72 hours. Cardiac Enzymes No results for input(s): CKTOTAL, CKMB, CKMBINDEX, TROPONINI in the last 72 hours.  BNP: BNP (last 3 results) Recent Labs    10/27/18 2210  BNP 778.0*    ProBNP (last 3 results) No results for input(s): PROBNP in the last 8760 hours.   D-Dimer No results for input(s): DDIMER in the last 72 hours. Hemoglobin A1C No results for input(s): HGBA1C in the last 72 hours. Fasting Lipid Panel No results for input(s): CHOL, HDL, LDLCALC, TRIG, CHOLHDL, LDLDIRECT in the last 72 hours. Thyroid Function Tests No results for input(s): TSH, T4TOTAL, T3FREE, THYROIDAB in the last 72 hours.  Invalid input(s): FREET3  Other results:   Imaging    No results found.   Medications:     Scheduled Medications: . [MAR Hold] acetaZOLAMIDE  250 mg Oral QODAY  . [facility]  Hold] amiodarone  100 mg Oral Daily  . [MAR Hold] aspirin EC  81 mg Oral Daily  . [MAR Hold] atorvastatin  20 mg Oral Daily  . [MAR Hold] Chlorhexidine Gluconate Cloth  6 each Topical Daily  . furosemide  80 mg Intravenous BID  . [MAR Hold] heparin  5,000 Units Subcutaneous Q8H  . [MAR Hold] insulin aspart  0-5 Units Subcutaneous QHS  . [MAR Hold] insulin aspart  0-9 Units Subcutaneous TID WC  . [MAR Hold] mouth rinse  15 mL Mouth Rinse BID  . [MAR Hold] midodrine  10 mg Oral TID WC  . [MAR Hold] mometasone-formoterol  2 puff Inhalation BID  . [MAR Hold] potassium chloride  20 mEq Oral BID  . [MAR Hold] sodium chloride flush  3 mL Intravenous Q12H  . [MAR Hold] spironolactone  12.5 mg Oral Daily    Infusions: . [MAR Hold] sodium chloride Stopped  (10/31/18 0830)  . sodium chloride 250 mL (11/06/18 0754)  . sodium chloride      PRN Medications: [MAR Hold] sodium chloride, sodium chloride, [MAR Hold] acetaminophen **OR** [MAR Hold] acetaminophen, Heparin (Porcine) in NaCl, [MAR Hold] ipratropium-albuterol, lidocaine (PF), [MAR Hold] meclizine, [MAR Hold] ondansetron, [MAR Hold] polyvinyl alcohol, [MAR Hold] sodium chloride flush, sodium chloride flush   Assessment/Plan   1. Acute on chronic hypoxemic respiratory failure: She has been labeled as having COPD but never smoked.  Based on body habitus, OHS/OSA is a possibility.  She is on 3 L home oxygen.  She is unable to tolerate CPAP.  2. Acute on chronic diastolic CHF with prominent RV failure: Echo (6/20) with EF 60-65%, severely dilated RV with moderately decreased RV systolic function, moderate TR, PASP 85 mmHg. She was markedly volume overloaded on exam initially and has diuresed well.  On current exam, she has some ongoing volume overload. RHC today showed severe pulmonary artery hypertension with ongoing volume overload. Creatinine improving.  - Would transition back to IV diuretic today => will give Lasix 80 mg IV bid for 1-2 more days to get her a little closer to euvolemic before SNF discharge.  - Midodrine being used to maintain BP, decreased to 5 mg tid.  Will leave at current dose while aggressively diuresing.  3. Atrial fibrillation: Paroxysmal.  She has only been in NSR here.  Apparently she was on anticoagulation (not sure what) a while ago and had severe epistaxis.  She stopped anticoagulation and does not want to restart. She is on ASA currently.  - Continue amiodarone to try to maintain NSR. I decreased amiodarone to 100 mg daily.  May be able to stop in the future. CT chest is not suggestive of amiodarone lung toxicity.  4. Pulmonary hypertension: Severe pulmonary hypertension by echo with RV dysfunction.  Etiology uncertain.  She has chronic hypoxemic respiratory failure.   This certainly could be due to long-standing pulmonary hypertension, but also possibly due to OSH/OSA.  She has never smoked so COPD unlikely. High resolution CT chest did not show evidence for ILD, there was bibasilar atelectasis.  V/Q scan with perfusion defect likely due to atelectasis. Discussed with radiology, unlikely PE and do not recommend further workup. Awaiting full serologic workup.  RHC shows severe pulmonary arterial hypertension.  I suspect that she has a combination of group 1 and group 3 (from OSA/OSA) PH.   - As above, will diurese a bit more to get her close to euvolemic.  I think she would be a reasonable candidate for a trial  of pulmonary vasodilators.  Will arrange for this as outpatient as will need insurance approval and specialty pharmacy involvement.  - Pending pulmonary hypertension serologic workup: ANA, RF (negative), anti-SCL70, anti-centromere.  5. AKI on CKD stage 3: Creatinine back down to 1.3.  Follow closely.  6. Mass in left axilla: She will need followup with her oncologist after discharge.   Plan for SNF in [place], would aim for 1-2 more days IV diuresis prior.   Loralie Champagne 11/06/2018 9:23 AM

## 2018-11-06 NOTE — Progress Notes (Signed)
PROGRESS NOTE    Tammy Mathews  QPR:[id number] DOB: May 18, 1950 DOA: 10/27/2018 PCP: Greig Right, MD    Brief Narrative:  [name], transferred from another hospital to [facility] due to concerns for worsening renal function and shortness of breath. HX includes breast cancer, morbid obesity, hypertension, hyperlipidemia, diabetes mellitus, chronic kidney disease and COPD on 3 L of oxygen. Patient was reported to have left-sided chest pain, shortness of breath, dyspnea on exertion, orthopnea, leg edema and 50 pound weight gain in the last month.  Previous Echocardiogram reveals moderate to severe pulmonary hypertension with likely diastolic dysfunction.She was admitted to cardiology floor 6/21 but has gotten progressively worsening shortness of breath, needing BIPAP . Lasix drip initiated by nephrology and thus far very poor urine output.  Assessment & Plan:   Principal Problem:   Acute on chronic respiratory failure with hypoxia and hypercapnia (HCC) Active Problems:   Volume overload   AKI (acute kidney injury) ([place])   CKD (chronic kidney disease)   Chronic diastolic CHF (congestive heart failure) (HCC)   COPD (chronic obstructive pulmonary disease) (HCC)   Acute respiratory distress   Acute-on-chronic renal failure (HCC)   Palliative care by specialist   Goals of care, counseling/discussion  Acute on chronic diastolic heart failure Severe pulmonary hypertension Right sided heart failure, cor pulmonale Cardiogenic shock requiring vasopressor support - now off pressors; BP stable and controlled at this time - appreciate cards assistance; Pt now s/p RHC 7/1 with findings concerning for severe pulm HTN and on-going vol overload    - Cardiology recommends continued 7m IV lasix BID for another 1-2 days  Cardiorenal syndrome AKI on CKD CKD stage III at baseline - diuresis as tolerated by patient; appreciate nephro direction     - now on  823mIV bid lasxi     - Repeat bmet in AM   Electrolyte imbalances secondary to diuresis - Cont to monitor bmet     - goal K >4 and Mg >2  Acute on chronic hypoxemic hypercarbic respiratory failure - CPAP/BiPAP at night; O2 support during the day; wean as able    - Seems stable at present  Diabetes type 2 - CBGs with SSI - glucose trends reviewed. Glucose stable  Vertigo - Continue meclazine    - Seems stable at this time  Normocytic anemia - stable, no frank bleed noted, monitor - iron of 26  Morbid obesity - recommend diet/lifestyle modification  Debility - PT consult recs SNF     - SW following  Chronic periodontitis with severe bone loss Generalized gingival recession Supra eruption and drifting of the unopposed teeth into the edentulous areas - eval'd by dental team earlier; patient has denied surgical correction at this time - per dental team:The patient may be willing to proceed with extractions [place] a [place] is more medically [place] after this admission. The patient will then follow-up with the general dentist of her choice for fabrication of upper and lower complete dentures after adequate healing. Patient is aware that due to her significant atrophy of the edentulous alveolar ridges, the prognosis for successful upper and lower complete denture fabrication is significantly limited. Patient is aware of implant options but has limited economic resources at this time. Patient is aware that Medicaid should cover the cost associated with the dental extraction procedures with an oral surgeon.I will assist the patient in follow-up with the oral surgeon if so instructed by the patient.  COPD -  home regimen: pulmicort/albuterol - continue dulera, duoneb    - Stable at this time   DVT prophylaxis: Heparin subq Code Status: Full Family Communication: Pt in  room, family not at bedside Disposition Plan: Uncertain at this time  Consultants:   Cardiology  Nephrology  Procedures:   [place] 7/1  Antimicrobials: Anti-infectives (From admission, onward)   None       Subjective: Reports feeling somewhat better, SOB after heart cath this AM  Objective: Vitals:   11/06/18 0906 11/06/18 0911 11/06/18 1016 11/06/18 1416  BP: (!) 144/75  (!) 129/59 (!) 126/50  Pulse: 97 100 85 81  Resp: 19 (!) 42 (!) 34 (!) 31  Temp:   97.7 F (36.5 C)   TempSrc:   Oral   SpO2: 95% (!) 0% 98% 94%  Weight:      Height:        Intake/Output Summary (Last 24 hours) at 11/06/2018 1420 Last data filed at 11/06/2018 0813 Gross per 24 hour  Intake 120 ml  Output 2400 ml  Net -2280 ml   Filed Weights   11/04/18 0500 11/05/18 0500 11/06/18 0300  Weight: 118 kg 117.1 kg 115.7 kg    Examination:  General exam: Appears calm and comfortable  Respiratory system: Clear to auscultation. Respiratory effort normal. Cardiovascular system: S1 & S2 heard, RRR. Gastrointestinal system: Abdomen is nondistended, soft and nontender. No organomegaly or masses felt. Normal bowel sounds heard. Central nervous system: Alert and oriented. No focal neurological deficits. Extremities: Symmetric 5 x 5 power. Skin: No rashes, lesions  Psychiatry: Judgement and insight appear normal. Mood & affect appropriate.   Data Reviewed: I have personally reviewed following labs and imaging studies  CBC: Recent Labs  Lab 11/01/18 0710 11/04/18 0338 11/05/18 0553 11/06/18 0401  WBC 4.0 4.4 5.2 4.9  NEUTROABS  --   --  3.7 3.5  HGB 8.5* 8.7* 8.7* 9.2*  HCT 28.8* 29.8* 29.5* 30.8*  MCV 88.3 89.2 89.1 89.0  PLT 163 147* 145* 876   Basic Metabolic Panel: Recent Labs  Lab 10/31/18 0253  11/02/18 0703 11/02/18 1622 11/03/18 0700 11/03/18 1212 11/04/18 0338 11/05/18 0553 11/06/18 0401  NA 140   < > 142  --  140  --  139 138 137  K 3.6   < > 3.5 3.9 3.7  --  3.6 4.0 4.2   CL 94*   < > 85*  --  84*  --  89* 92* 92*  CO2 33*   < > 44*  --  45*  --  42* 36* 34*  GLUCOSE 167*   < > 145*  --  132*  --  169* 158* 161*  BUN 79*   < > 59*  --  51*  --  42* 35* 32*  CREATININE 2.05*   < > 1.78*  --  1.59*  --  1.42* 1.35* 1.30*  CALCIUM 8.8*   < > 9.0  --  9.0  --  8.8* 8.9 8.9  MG 2.3  --  1.9  --   --  1.8  --  1.9 2.1  PHOS 3.9  --   --   --   --   --   --   --  2.3*   < > = values in this interval not displayed.   GFR: Estimated Creatinine Clearance: 48.4 mL/min (A) (by C-G formula based on SCr of 1.3 mg/dL (H)). Liver Function Tests: Recent Labs  Lab 11/06/18 0401  ALBUMIN 2.8*  No results for input(s): LIPASE, AMYLASE in the last 168 hours. No results for input(s): AMMONIA in the last 168 hours. Coagulation Profile: No results for input(s): INR, PROTIME in the last 168 hours. Cardiac Enzymes: No results for input(s): CKTOTAL, CKMB, CKMBINDEX, TROPONINI in the last 168 hours. BNP (last 3 results) No results for input(s): PROBNP in the last 8760 hours. HbA1C: No results for input(s): HGBA1C in the last 72 hours. CBG: Recent Labs  Lab 11/05/18 1129 11/05/18 1627 11/05/18 2121 11/06/18 0625 11/06/18 1023  GLUCAP 196* 200* 158* 158* 144*   Lipid Profile: No results for input(s): CHOL, HDL, LDLCALC, TRIG, CHOLHDL, LDLDIRECT in the last 72 hours. Thyroid Function Tests: No results for input(s): TSH, T4TOTAL, FREET4, T3FREE, THYROIDAB in the last 72 hours. Anemia Panel: No results for input(s): VITAMINB12, FOLATE, FERRITIN, TIBC, IRON, RETICCTPCT in the last 72 hours. Sepsis Labs: No results for input(s): PROCALCITON, LATICACIDVEN in the last 168 hours.  Recent Results (from the past 240 hour(s))  MRSA PCR Screening     Status: Abnormal   Collection Time: 10/28/18 12:08 AM   Specimen: Nasopharyngeal  Result Value Ref Range Status   MRSA by PCR POSITIVE (A) NEGATIVE Final    Comment:        The GeneXpert MRSA Assay (FDA approved for NASAL  specimens only), is one component of a comprehensive MRSA colonization surveillance program. It is not intended to diagnose MRSA infection nor to guide or monitor treatment for MRSA infections. MCDUFFIE,E RN [phone] 10/28/2018 MITCHELL,L Performed at [facility] [address], [place], [place] [address]      Radiology Studies: Dg Chest 2 View  Result Date: 11/04/2018 CLINICAL DATA:  CHF.  History of hypertension. EXAM: CHEST - 2 VIEW COMPARISON:  Chest x-ray 10/31/2018 FINDINGS: The right subclavian power port is stable. Very low lung volumes with vascular crowding and atelectasis. There is moderate vascular congestion without overt pulmonary edema. Stable elevation right hemidiaphragm and overlying small pleural effusion. Persistent streaky areas of subsegmental atelectasis. IMPRESSION: Cardiac enlargement and vascular congestion without overt pulmonary edema. Elevation right hemidiaphragm with overlying small effusion. Persistent areas of subsegmental atelectasis. Electronically Signed   By: Marijo Sanes M.D.   On: 11/04/2018 15:52   Nm Pulmonary Perfusion  Result Date: 11/04/2018 CLINICAL DATA:  Pulmonary hypertension.  Shortness of breath. EXAM: NUCLEAR MEDICINE PERFUSION LUNG SCAN TECHNIQUE: Perfusion images were obtained in multiple projections after intravenous injection of radiopharmaceutical. Ventilation scans intentionally deferred if perfusion scan and chest x-ray adequate for interpretation during COVID 19 epidemic. The patient declined further as she was unable to lie flat. RADIOPHARMACEUTICALS:  1.5 mCi Tc-2mMAA IV COMPARISON:  Chest radiograph 10/31/2018. FINDINGS: Single moderate size perfusion defect is identified within the left midlung. This corresponds to an area of subsegmental atelectasis or scarring noted on previous chest radiograph. Asymmetric elevation of right hemidiaphragm is noted corresponding to the chest radiograph. IMPRESSION: 1. There is a single moderate  size perfusion defect within the left midlung. Although indeterminate a corresponding area of scar/subsegmental atelectasis is noted within the left midlung. Given the absence of ventilation images, modified PIOPED criteria do not apply. No additional perfusion defects identified to suggest multiple pulmonary emboli. Electronically Signed   By: TKerby MoorsM.D.   On: 11/04/2018 15:50   Ct Chest High Resolution  Result Date: 11/04/2018 CLINICAL DATA:  Pulmonary hypertension suspected EXAM: CT CHEST WITHOUT CONTRAST TECHNIQUE: Multidetector CT imaging of the chest was performed following the standard protocol without intravenous  contrast. High resolution imaging of the lungs, as well as inspiratory and expiratory imaging, was performed. COMPARISON:  CT chest, 10/24/2018 FINDINGS: Cardiovascular: Cardiomegaly. Left coronary artery calcifications. The main pulmonary artery is enlarged, measuring up to 4.2 cm in caliber. No pericardial effusion. Right subclavian port catheter. Mediastinum/Nodes: No enlarged mediastinal, hilar, or axillary lymph nodes. Thyroid gland, trachea, and esophagus demonstrate no significant findings. Lungs/Pleura: Bibasilar consolidation or atelectasis, similar to prior examination and most conspicuous in the right lung base. Small pleural effusions, similar to prior examination. No significant air trapping on expiratory phase imaging. Upper Abdomen: No acute abnormality. Coarse contour of the liver. Trace perihepatic ascites. Splenomegaly. Musculoskeletal: Status post left mastectomy. There is a spiculated mass in the left axilla containing a biopsy clip (series 4, image 22). IMPRESSION: 1. Bibasilar consolidation or atelectasis, similar to prior examination and most conspicuous in the right lung base. Small pleural effusions, similar to prior examination. No significant air trapping on expiratory phase imaging. No apparent evidence of fibrotic interstitial lung disease, however the  presence of effusions and atelectasis significantly limits evaluation for subtle changes. 2. The main pulmonary artery is enlarged, measuring up to 4.2 cm in caliber, which can be seen in pulmonary hypertension. 3.  Coronary artery disease. 4. There is a spiculated mass in the left axilla containing a biopsy clip (series 4, image 22). Correlate with mammographic evaluation. 5. Coarse contour of the liver and splenomegaly, findings suggestive of hepatic cirrhosis. Electronically Signed   By: Eddie Candle M.D.   On: 11/04/2018 17:52    Scheduled Meds:  acetaZOLAMIDE  250 mg Oral QODAY   amiodarone  100 mg Oral Daily   aspirin EC  81 mg Oral Daily   atorvastatin  20 mg Oral Daily   Chlorhexidine Gluconate Cloth  6 each Topical Daily   furosemide  80 mg Intravenous BID   heparin  5,000 Units Subcutaneous Q8H   insulin aspart  0-5 Units Subcutaneous QHS   insulin aspart  0-9 Units Subcutaneous TID WC   mouth rinse  15 mL Mouth Rinse BID   midodrine  10 mg Oral TID WC   mometasone-formoterol  2 puff Inhalation BID   potassium chloride  20 mEq Oral BID   sodium chloride flush  3 mL Intravenous Q12H   spironolactone  12.5 mg Oral Daily   Continuous Infusions:  sodium chloride Stopped (10/31/18 0830)     LOS: 10 days   Marylu Lund, MD [facility] Pager On Amion  If 7PM-7AM, please contact night-coverage 11/06/2018, 2:20 PM

## 2018-11-06 NOTE — TOC Progression Note (Signed)
Transition of Care [facility]) - Progression Note    Patient Details  Name: Tammy Mathews MRN: [id number] Date of Birth: 09/21/1950  Transition of Care [facility]) CM/SW Contact  Candie Chroman, LCSW Phone Number: 11/06/2018, 1:44 PM  Clinical Narrative: [place] has insurance authorization approval to admit patient when stable and COVID results are available.    Expected Discharge Plan: Skilled Nursing Facility Barriers to Discharge: Ship broker, Continued Medical Work up, SNF Pending bed offer  Expected Discharge Plan and Services Expected Discharge Plan: [place] Choice: [place] arrangements for the past 2 months: Single Family Home                                       Social Determinants of Health (SDOH) Interventions    Readmission Risk Interventions No flowsheet data found.

## 2018-11-06 NOTE — Progress Notes (Signed)
Bipap is "as needed".  Patient not in distress.

## 2018-11-07 DIAGNOSIS — I5033 Acute on chronic diastolic (congestive) heart failure: Secondary | ICD-10-CM

## 2018-11-07 LAB — BASIC METABOLIC PANEL
Anion gap: 8 (ref 5–15)
BUN: 32 mg/dL — ABNORMAL HIGH (ref 8–23)
CO2: 35 mmol/L — ABNORMAL HIGH (ref 22–32)
Calcium: 9 mg/dL (ref 8.9–10.3)
Chloride: 94 mmol/L — ABNORMAL LOW (ref 98–111)
Creatinine, Ser: 1.36 mg/dL — ABNORMAL HIGH (ref 0.44–1.00)
GFR calc Af Amer: 46 mL/min — ABNORMAL LOW (ref >60)
GFR calc non Af Amer: 40 mL/min — ABNORMAL LOW (ref >60)
Glucose, Bld: 167 mg/dL — ABNORMAL HIGH (ref 70–99)
Potassium: 4 mmol/L (ref 3.5–5.1)
Sodium: 137 mmol/L (ref 135–145)

## 2018-11-07 LAB — GLUCOSE, CAPILLARY
Glucose-Capillary: 146 mg/dL — ABNORMAL HIGH (ref 70–99)
Glucose-Capillary: 199 mg/dL — ABNORMAL HIGH (ref 70–99)
Glucose-Capillary: 181 mg/dL — ABNORMAL HIGH (ref 70–99)
Glucose-Capillary: 200 mg/dL — ABNORMAL HIGH (ref 70–99)

## 2018-11-07 MED ORDER — NEOMYCIN-POLYMYXIN-HC 1 % OT SOLN: 3.0000 [drp] | Freq: Four times a day (QID) | OTIC | Status: DC

## 2018-11-07 MED ORDER — FUROSEMIDE 10 MG/ML IJ SOLN
INTRAMUSCULAR | Status: AC
  Administered 2018-11-07: 80 mg via INTRAVENOUS
  Filled 2018-11-07: qty 8

## 2018-11-07 MED ORDER — GLUCERNA SHAKE PO LIQD
237.0000 mL | Freq: Two times a day (BID) | ORAL | Status: DC
  Administered 2018-11-07 – 2018-11-08 (×4): 237 mL via ORAL

## 2018-11-07 MED ORDER — NEOMYCIN-POLYMYXIN-HC 3.5-10000-1 OT SUSP
3.0000 [drp] | Freq: Four times a day (QID) | OTIC | Status: DC
  Administered 2018-11-07 – 2018-11-08 (×5): 3 [drp] via OTIC
  Filled 2018-11-07: qty 10

## 2018-11-07 NOTE — Care Management Important Message (Signed)
Important Message  Patient Details  Name: Tammy Mathews MRN: [id number] Date of Birth: 07-Aug-1950   Medicare Important Message Given:  Yes     Memory Argue 11/07/2018, 2:13 PM

## 2018-11-07 NOTE — Progress Notes (Signed)
Initial Nutrition Assessment  DOCUMENTATION CODES:   Morbid obesity  INTERVENTION:   - Recommend bowel regimen as pt's last recorded BM was on 6/26  - Glucerna Shake po BID, each supplement provides 220 kcal and 10 grams of protein  NUTRITION DIAGNOSIS:   Increased nutrient needs related to chronic illness (COPD, CHF) as evidenced by estimated needs.  GOAL:   Patient will meet greater than or equal to 90% of their needs  MONITOR:   PO intake, Labs, I & O's, Weight trends, Supplement acceptance  REASON FOR ASSESSMENT:   LOS    ASSESSMENT:   68 year old female who presented on 6/21 with dyspnea. PMH of breast cancer in remission, HTN, HLD, T2DM, COPD on home oxygen. Pt admitted with acute on chronic diastolic CHF and AKI on CKD stage III.  7/01 - right heart cath with findings concerning for severe pulmonary HTN and ongoing volume overload  Weight down 71 lbs since admission. Pt continues to diurese. Pt is net negative 35.7 L since admit.  Spoke with pt at bedside. Pt reports that her appetite is improving and states she ate 100% of her breakfast. Pt willing to drink an oral nutrition supplement given variable PO intake and requests vanilla Glucerna.  Pt states that PTA, she was eating well and "eating anything and everything I wanted to." Pt states she had recently gained 60-70 lbs PTA due to fluid overload. Pt reports she feels much better now that she is diuresing.  Meal Completion: 50-100% x last 8 meals  Medications reviewed and include: IV Lasix 80 mg BID, SSI, K-dur 20 mEq BID, spironolactone  Labs reviewed. CBG's: 146-199 x 24 hours  UOP: 4500 ml x 24 hours I/O's: -35.7 L since admit  NUTRITION - FOCUSED PHYSICAL EXAM:    Most Recent Value  Orbital Region  No depletion  Upper Arm Region  No depletion  Thoracic and Lumbar Region  No depletion  Buccal Region  No depletion  Temple Region  No depletion  Clavicle Bone Region  No depletion  Clavicle and  Acromion Bone Region  Mild depletion  Scapular Bone Region  Unable to assess  Dorsal Hand  No depletion  Patellar Region  No depletion  Anterior Thigh Region  No depletion  Posterior Calf Region  No depletion  Edema (RD Assessment)  Moderate [BLE]  Hair  Reviewed  Eyes  Reviewed  Mouth  Reviewed  Skin  Reviewed  Nails  Reviewed       Diet Order:   Diet Order            DIET DYS 3 Room service appropriate? Yes; Fluid consistency: Thin; Fluid restriction: 1200 mL Fluid  Diet effective now              EDUCATION NEEDS:   Education needs have been addressed  Skin:  Skin Assessment: Reviewed RN Assessment (MASD to breast)  Last BM:  11/01/18  Height:   Ht Readings from Last 1 Encounters:  10/27/18 _0  (1.549 m)    Weight:   Wt Readings from Last 1 Encounters:  11/07/18 111.1 kg    Ideal Body Weight:  47.7 kg  BMI:  Body mass index is 46.28 kg/m.  Estimated Nutritional Needs:   Kcal:  1600-1800  Protein:  80-95 grams  Fluid:  per MD    Gaynell Face, MS, RD, LDN Inpatient Clinical Dietitian Pager: [phone] Weekend/After Hours: [phone]

## 2018-11-07 NOTE — Progress Notes (Signed)
Patient ID: Tammy Mathews, female   DOB: 25-Nov-1950, 68 y.o.   MRN: [id number]     Advanced Heart Failure Rounding Note  PCP-Cardiologist: No primary care provider on file.   Subjective:    Excellent UOP on IV Lasix yesterday, weight trending down.  Renal function stable.  Not short of breath at rest.   RHC Procedural Findings (11/06/18): Hemodynamics (mmHg) RA mean 14 RV 77/16 PA 80/34, mean 53 PCWP mean 21 Oxygen saturations: PA 68% AO 96% Cardiac Output (Fick) 7.96  Cardiac Index (Fick) 3.8 PVR 4 WU   Objective:   Weight Range: 111.1 kg Body mass index is 46.28 kg/m.   Vital Signs:   Temp:  [97.6 F (36.4 C)-98.1 F (36.7 C)] 98.1 F (36.7 C) (07/02 0723) Pulse Rate:  [46-107] 78 (07/02 0723) Resp:  [10-61] 17 (07/02 0723) BP: (113-149)/(50-79) 127/54 (07/02 0723) SpO2:  [0 %-100 %] 98 % (07/02 0723) Weight:  [111.1 kg] 111.1 kg (07/02 0253) Last BM Date: 12/03/18  Weight change: Filed Weights   11/05/18 0500 11/06/18 0300 11/07/18 0253  Weight: 117.1 kg 115.7 kg 111.1 kg    Intake/Output:   Intake/Output Summary (Last 24 hours) at 11/07/2018 0755 Last data filed at 11/06/2018 2307 Gross per 24 hour  Intake 120 ml  Output 4500 ml  Net -4380 ml      Physical Exam    General: NAD, chronically ill-appearing Neck: JVP 10-12 cm, no thyromegaly or thyroid nodule.  Lungs: Decreased BS at bases.  CV: Nondisplaced PMI.  Heart regular S1/S2, no S3/S4, no murmur.  Trace ankle edema, unna boots.   Abdomen: Soft, nontender, no hepatosplenomegaly, no distention.  Skin: Intact without lesions or rashes.  Neurologic: Alert and oriented x 3.  Psych: Normal affect. Extremities: No clubbing or cyanosis.  HEENT: Normal.    Telemetry   NSR 80s (personally reviewed)  EKG    N/A  Labs    CBC Recent Labs    11/05/18 0553 11/06/18 0401 11/06/18 0907  WBC 5.2 4.9  --   NEUTROABS 3.7 3.5  --   HGB 8.7* 9.2* 11.2*  10.9*  HCT 29.5* 30.8* 33.0*  32.0*   MCV 89.1 89.0  --   PLT 145* 161  --    Basic Metabolic Panel Recent Labs    11/05/18 0553 11/06/18 0401 11/06/18 0907 11/07/18 0447  NA 138 137 139  141 137  K 4.0 4.2 4.1  4.0 4.0  CL 92* 92*  --  94*  CO2 36* 34*  --  35*  GLUCOSE 158* 161*  --  167*  BUN 35* 32*  --  32*  CREATININE 1.35* 1.30*  --  1.36*  CALCIUM 8.9 8.9  --  9.0  MG 1.9 2.1  --   --   PHOS  --  2.3*  --   --    Liver Function Tests Recent Labs    11/06/18 0401  ALBUMIN 2.8*   No results for input(s): LIPASE, AMYLASE in the last 72 hours. Cardiac Enzymes No results for input(s): CKTOTAL, CKMB, CKMBINDEX, TROPONINI in the last 72 hours.  BNP: BNP (last 3 results) Recent Labs    10/27/18 2210  BNP 778.0*    ProBNP (last 3 results) No results for input(s): PROBNP in the last 8760 hours.   D-Dimer No results for input(s): DDIMER in the last 72 hours. Hemoglobin A1C No results for input(s): HGBA1C in the last 72 hours. Fasting Lipid Panel No results for input(s): CHOL, HDL,  LDLCALC, TRIG, CHOLHDL, LDLDIRECT in the last 72 hours. Thyroid Function Tests No results for input(s): TSH, T4TOTAL, T3FREE, THYROIDAB in the last 72 hours.  Invalid input(s): FREET3  Other results:   Imaging    No results found.   Medications:     Scheduled Medications: . acetaZOLAMIDE  250 mg Oral QODAY  . amiodarone  100 mg Oral Daily  . aspirin EC  81 mg Oral Daily  . atorvastatin  20 mg Oral Daily  . Chlorhexidine Gluconate Cloth  6 each Topical Daily  . furosemide  80 mg Intravenous BID  . heparin  5,000 Units Subcutaneous Q8H  . insulin aspart  0-5 Units Subcutaneous QHS  . insulin aspart  0-9 Units Subcutaneous TID WC  . mouth rinse  15 mL Mouth Rinse BID  . midodrine  10 mg Oral TID WC  . mometasone-formoterol  2 puff Inhalation BID  . potassium chloride  20 mEq Oral BID  . sodium chloride flush  3 mL Intravenous Q12H  . spironolactone  12.5 mg Oral Daily    Infusions: . sodium  chloride Stopped (10/31/18 0830)    PRN Medications: sodium chloride, acetaminophen **OR** acetaminophen, ipratropium-albuterol, meclizine, ondansetron, polyvinyl alcohol, sodium chloride flush   Assessment/Plan   1. Acute on chronic hypoxemic respiratory failure: She has been labeled as having COPD but never smoked.  Based on body habitus, OHS/OSA is a possibility.  She is on 3 L home oxygen.  She is unable to tolerate CPAP.  2. Acute on chronic diastolic CHF with prominent RV failure: Echo (6/20) with EF 60-65%, severely dilated RV with moderately decreased RV systolic function, moderate TR, PASP 85 mmHg. She was markedly volume overloaded on exam initially and has diuresed well.  RHC yesterday showed severe pulmonary artery hypertension with ongoing volume overload. She diuresed well back on IV Lasix yesterday, still with mild volume overload by exam. Renal function stable.  - Would give 1 more day of IV Lasix today, probably start torsemide 60 mg daily tomorrow.   - Midodrine being used to maintain BP, decreased to 5 mg tid.  Will leave at current dose while aggressively diuresing.  3. Atrial fibrillation: Paroxysmal.  She has only been in NSR here.  Apparently she was on anticoagulation (not sure what) a while ago and had severe epistaxis.  She stopped anticoagulation and does not want to restart. She is on ASA currently.  - Continue amiodarone to try to maintain NSR. I decreased amiodarone to 100 mg daily.  May be able to stop in the future. CT chest is not suggestive of amiodarone lung toxicity.  4. Pulmonary hypertension: Severe pulmonary hypertension by echo with RV dysfunction.  Etiology uncertain.  She has chronic hypoxemic respiratory failure.  This certainly could be due to long-standing pulmonary hypertension, but also possibly due to OSH/OSA.  She has never smoked so COPD unlikely. High resolution CT chest did not show evidence for ILD, there was bibasilar atelectasis.  V/Q scan with  perfusion defect likely due to atelectasis. Discussed with radiology, unlikely PE and do not recommend further workup. Serologic workup for autoimmune causes of pulmonary hypertension was negative.  RHC shows severe pulmonary arterial hypertension.  I suspect that she has a combination of group 1 and group 3 (from OSA/OSA) PH.   - As above, will diurese one more day with IV Lasix to get her close to euvolemic.  I think she would be a reasonable candidate for a trial of pulmonary vasodilators.  Will arrange  for this as outpatient as will need insurance approval and specialty pharmacy involvement.  - Pulmonary hypertension serologic workup: ANA, RF, anti-SCL70, anti-centromere, ANCA all negative.  5. AKI on CKD stage 3: Creatinine stable at 1.3.  Follow closely.  6. Mass in left axilla: She will need followup with her oncologist after discharge.   Plan for SNF in [place], anticipate transition to po diuretic tomorrow and should be able to leave soon.   Loralie Champagne 11/07/2018 7:55 AM

## 2018-11-07 NOTE — Progress Notes (Signed)
Offered Glucerna 3 times this afternoon, patient states she will drink it between 8 and 9 pm

## 2018-11-07 NOTE — Progress Notes (Signed)
PROGRESS NOTE    Tammy Mathews  XKP:[id number] DOB: [birth year] DOA: 10/27/2018 PCP: Greig Right, MD    Brief Narrative:  [name], transferred from another hospital to [facility] due to concerns for worsening renal function and shortness of breath. HX includes breast cancer, morbid obesity, hypertension, hyperlipidemia, diabetes mellitus, chronic kidney disease and COPD on 3 L of oxygen. Patient was reported to have left-sided chest pain, shortness of breath, dyspnea on exertion, orthopnea, leg edema and 50 pound weight gain in the last month.  Previous Echocardiogram reveals moderate to severe pulmonary hypertension with likely diastolic dysfunction.She was admitted to cardiology floor 6/21 but has gotten progressively worsening shortness of breath, needing BIPAP . Lasix drip initiated by nephrology and thus far very poor urine output.  Assessment & Plan:   Principal Problem:   Acute on chronic respiratory failure with hypoxia and hypercapnia (HCC) Active Problems:   Volume overload   AKI (acute kidney injury) ([facility])   CKD (chronic kidney disease)   Chronic diastolic CHF (congestive heart failure) (HCC)   COPD (chronic obstructive pulmonary disease) (HCC)   Acute respiratory distress   Acute-on-chronic renal failure (HCC)   Palliative care by specialist   Goals of care, counseling/discussion  Acute on chronic diastolic heart failure Severe pulmonary hypertension Right sided heart failure, cor pulmonale Cardiogenic shock requiring vasopressor support - now off pressors; BP stable and controlled at this time - appreciate cards assistance; Pt now s/p RHC 7/1 with findings concerning for severe pulm HTN and on-going vol overload    - Cardiology recommends to continue IV lasix at least one more day, possible transition to PO diuretic afterwards  Cardiorenal syndrome AKI on CKD CKD stage III at baseline - diuresis as tolerated by patient;  appreciate nephro direction     - now on 44m IV bid lasix     - Recheck bmet in AM  Electrolyte imbalances secondary to diuresis - Repeat bmet in AM     - goal K >4 and Mg >2  Acute on chronic hypoxemic hypercarbic respiratory failure - CPAP/BiPAP at night; O2 support during the day; wean as able    - Seems stable at present  Diabetes type 2 - CBGs with SSI - glucose trends reviewed. Glucose currently stable  Vertigo - Continue meclazine    -  Presently stable stable at this time  Normocytic anemia - stable, no frank bleed noted, monitor - iron of 26 noted  Morbid obesity - recommend diet/lifestyle modification  Debility - PT consult recs SNF     - SW is following  Chronic periodontitis with severe bone loss Generalized gingival recession Supra eruption and drifting of the unopposed teeth into the edentulous areas - eval'd by dental team earlier; patient has denied surgical correction at this time - per dental team:The patient may be willing to [place]. The patient will then follow-up with the general dentist of her choice for fabrication of upper and lower complete dentures after adequate healing. Patient is aware that due to her significant atrophy of the edentulous alveolar ridges, the prognosis for successful upper and lower complete denture fabrication is significantly limited. Patient is aware of implant options but has limited economic resources at this time. Patient is aware that Medicaid should cover the cost associated with the dental extraction procedures with an oral surgeon.I will assist the patient in follow-up with the  oral surgeon if so instructed by the patient.  COPD - home regimen: pulmicort/albuterol - continue dulera, duoneb    - Remains stable at this time    DVT prophylaxis: Heparin subq Code Status: Full Family Communication: Pt in room, family not at bedside Disposition Plan: Uncertain at this time  Consultants:   Cardiology  Nephrology  Procedures:   [place] 7/1  Antimicrobials: Anti-infectives (From admission, onward)   None      Subjective: Without complaints. Denies sob  Objective: Vitals:   11/07/18 0435 11/07/18 0723 11/07/18 0815 11/07/18 1042  BP: (!) 113/51 (!) 127/54 (!) 127/54 (!) 119/47  Pulse: 73 78 77 80  Resp: _0 (!) 26  Temp: 97.9 F (36.6 C) 98.1 F (36.7 C)  97.8 F (36.6 C)  TempSrc: Oral Oral  Oral  SpO2: 100% 98% 100% 100%  Weight:      Height:        Intake/Output Summary (Last 24 hours) at 11/07/2018 1757 Last data filed at 11/07/2018 1700 Gross per 24 hour  Intake 660 ml  Output 3000 ml  Net -2340 ml   Filed Weights   11/05/18 0500 11/06/18 0300 11/07/18 0253  Weight: 117.1 kg 115.7 kg 111.1 kg    Examination: General exam: Awake, laying in bed, in nad Respiratory system: Normal respiratory effort, no wheezing Cardiovascular system: regular rate, s1, s2 Gastrointestinal system: Soft, nondistended, positive BS Central nervous system: CN2-12 grossly intact, strength intact Extremities: Perfused, no clubbing Skin: Normal skin turgor, no notable skin lesions seen Psychiatry: Mood normal // no visual hallucinations   Data Reviewed: I have personally reviewed following labs and imaging studies  CBC: Recent Labs  Lab 11/01/18 0710 11/04/18 0338 11/05/18 0553 11/06/18 0401 11/06/18 0907  WBC 4.0 4.4 5.2 4.9  --   NEUTROABS  --   --  3.7 3.5  --   HGB 8.5* 8.7* 8.7* 9.2* 11.2*  10.9*  HCT 28.8* 29.8* 29.5* 30.8* 33.0*  32.0*  MCV 88.3 89.2 89.1 89.0  --   PLT 163 147* 145* 161  --    Basic Metabolic Panel: Recent Labs  Lab 11/02/18 0703  11/03/18 0700 11/03/18 1212 11/04/18 0338 11/05/18 0553 11/06/18 0401 11/06/18 0907 11/07/18 0447  NA 142  --  140  --  139 138  137 139  141 137  K 3.5   < > 3.7  --  3.6 4.0 4.2 4.1  4.0 4.0  CL 85*  --  84*  --  89* 92* 92*  --  94*  CO2 44*  --  45*  --  42* 36* 34*  --  35*  GLUCOSE 145*  --  132*  --  169* 158* 161*  --  167*  BUN 59*  --  51*  --  42* 35* 32*  --  32*  CREATININE 1.78*  --  1.59*  --  1.42* 1.35* 1.30*  --  1.36*  CALCIUM 9.0  --  9.0  --  8.8* 8.9 8.9  --  9.0  MG 1.9  --   --  1.8  --  1.9 2.1  --   --   PHOS  --   --   --   --   --   --  2.3*  --   --    < > = values in this interval not displayed.   GFR: Estimated Creatinine Clearance: 45.1 mL/min (A) (by C-G formula based on SCr of 1.36 mg/dL (H)).  Liver Function Tests: Recent Labs  Lab 11/06/18 0401  ALBUMIN 2.8*   No results for input(s): LIPASE, AMYLASE in the last 168 hours. No results for input(s): AMMONIA in the last 168 hours. Coagulation Profile: No results for input(s): INR, PROTIME in the last 168 hours. Cardiac Enzymes: No results for input(s): CKTOTAL, CKMB, CKMBINDEX, TROPONINI in the last 168 hours. BNP (last 3 results) No results for input(s): PROBNP in the last 8760 hours. HbA1C: No results for input(s): HGBA1C in the last 72 hours. CBG: Recent Labs  Lab 11/06/18 1640 11/06/18 2129 11/07/18 0627 11/07/18 1111 11/07/18 1631  GLUCAP 161* 166* 146* 199* 181*   Lipid Profile: No results for input(s): CHOL, HDL, LDLCALC, TRIG, CHOLHDL, LDLDIRECT in the last 72 hours. Thyroid Function Tests: No results for input(s): TSH, T4TOTAL, FREET4, T3FREE, THYROIDAB in the last 72 hours. Anemia Panel: No results for input(s): VITAMINB12, FOLATE, FERRITIN, TIBC, IRON, RETICCTPCT in the last 72 hours. Sepsis Labs: No results for input(s): PROCALCITON, LATICACIDVEN in the last 168 hours.  Recent Results (from the past 240 hour(s))  Novel Coronavirus, NAA (hospital order; send-out to ref lab)     Status: None   Collection Time: 11/05/18  3:07 PM  Result Value Ref Range Status   SARS-CoV-2, NAA NOT DETECTED NOT  DETECTED Final    Comment: (NOTE) This test was developed and its performance characteristics determined by [employer]. This test has not been FDA cleared or approved. This test has been authorized by FDA under an Emergency Use Authorization (EUA). This test is only authorized for the duration of time the declaration that circumstances exist justifying the authorization of the emergency use of in vitro diagnostic tests for detection of SARS-CoV-2 virus and/or diagnosis of COVID-19 infection under section 564(b)(1) of the Act, 21 U.S.C. 500XFG-1(W)(2), unless the authorization is terminated or revoked sooner. When diagnostic testing is negative, the possibility of a false negative result should be considered in the context of a patient's recent exposures and the presence of clinical signs and symptoms consistent with COVID-19. An individual without symptoms of COVID-19 and who is not shedding SARS-CoV-2 virus would expect to have a negative (not detected) result in this assay. Performed  At: [facility] [address] [place], [place] [id number] Rush Farmer MD EL:[id number]    [place]  Final    Comment: Performed at [facility], [place] [address] [place], [place] [address]     Radiology Studies: No results found.  Scheduled Meds: . acetaZOLAMIDE  250 mg Oral QODAY  . amiodarone  100 mg Oral Daily  . aspirin EC  81 mg Oral Daily  . atorvastatin  20 mg Oral Daily  . Chlorhexidine Gluconate Cloth  6 each Topical Daily  . feeding supplement (GLUCERNA SHAKE)  237 mL Oral BID BM  . heparin  5,000 Units Subcutaneous Q8H  . insulin aspart  0-5 Units Subcutaneous QHS  . insulin aspart  0-9 Units Subcutaneous TID WC  . mouth rinse  15 mL Mouth Rinse BID  . midodrine  10 mg Oral TID WC  . mometasone-formoterol  2 puff Inhalation BID  . neomycin-polymyxin-hydrocortisone  3 drop Right EAR Q6H  . potassium chloride  20 mEq Oral BID  .  sodium chloride flush  3 mL Intravenous Q12H  . spironolactone  12.5 mg Oral Daily   Continuous Infusions: . sodium chloride Stopped (10/31/18 0830)     LOS: 11 days   Marylu Lund, MD [facility] Pager On  Amion  If 7PM-7AM, please contact night-coverage 11/07/2018, 5:57 PM

## 2018-11-08 DIAGNOSIS — Z7951 Long term (current) use of inhaled steroids: Secondary | ICD-10-CM | POA: Diagnosis not present

## 2018-11-08 DIAGNOSIS — R42 Dizziness and giddiness: Secondary | ICD-10-CM | POA: Diagnosis not present

## 2018-11-08 DIAGNOSIS — N183 Chronic kidney disease, stage 3 (moderate): Secondary | ICD-10-CM | POA: Diagnosis not present

## 2018-11-08 DIAGNOSIS — E119 Type 2 diabetes mellitus without complications: Secondary | ICD-10-CM | POA: Diagnosis not present

## 2018-11-08 DIAGNOSIS — Z1159 Encounter for screening for other viral diseases: Secondary | ICD-10-CM | POA: Diagnosis not present

## 2018-11-08 DIAGNOSIS — C50919 Malignant neoplasm of unspecified site of unspecified female breast: Secondary | ICD-10-CM | POA: Diagnosis not present

## 2018-11-08 DIAGNOSIS — M79671 Pain in right foot: Secondary | ICD-10-CM | POA: Diagnosis not present

## 2018-11-08 DIAGNOSIS — I5032 Chronic diastolic (congestive) heart failure: Secondary | ICD-10-CM | POA: Diagnosis not present

## 2018-11-08 DIAGNOSIS — I480 Paroxysmal atrial fibrillation: Secondary | ICD-10-CM | POA: Diagnosis not present

## 2018-11-08 DIAGNOSIS — E1122 Type 2 diabetes mellitus with diabetic chronic kidney disease: Secondary | ICD-10-CM | POA: Diagnosis not present

## 2018-11-08 DIAGNOSIS — I5033 Acute on chronic diastolic (congestive) heart failure: Secondary | ICD-10-CM | POA: Diagnosis not present

## 2018-11-08 DIAGNOSIS — Z9981 Dependence on supplemental oxygen: Secondary | ICD-10-CM | POA: Diagnosis not present

## 2018-11-08 DIAGNOSIS — A4189 Other specified sepsis: Secondary | ICD-10-CM | POA: Diagnosis not present

## 2018-11-08 DIAGNOSIS — J9611 Chronic respiratory failure with hypoxia: Secondary | ICD-10-CM | POA: Diagnosis not present

## 2018-11-08 DIAGNOSIS — Z7401 Bed confinement status: Secondary | ICD-10-CM | POA: Diagnosis not present

## 2018-11-08 DIAGNOSIS — R000 Tachycardia, unspecified: Secondary | ICD-10-CM | POA: Diagnosis not present

## 2018-11-08 DIAGNOSIS — R0689 Other abnormalities of breathing: Secondary | ICD-10-CM | POA: Diagnosis not present

## 2018-11-08 DIAGNOSIS — E785 Hyperlipidemia, unspecified: Secondary | ICD-10-CM | POA: Diagnosis not present

## 2018-11-08 DIAGNOSIS — N179 Acute kidney failure, unspecified: Secondary | ICD-10-CM | POA: Diagnosis not present

## 2018-11-08 DIAGNOSIS — E875 Hyperkalemia: Secondary | ICD-10-CM | POA: Diagnosis not present

## 2018-11-08 DIAGNOSIS — Z8249 Family history of ischemic heart disease and other diseases of the circulatory system: Secondary | ICD-10-CM | POA: Diagnosis not present

## 2018-11-08 DIAGNOSIS — J9612 Chronic respiratory failure with hypercapnia: Secondary | ICD-10-CM | POA: Diagnosis not present

## 2018-11-08 DIAGNOSIS — E782 Mixed hyperlipidemia: Secondary | ICD-10-CM | POA: Diagnosis not present

## 2018-11-08 DIAGNOSIS — N170 Acute kidney failure with tubular necrosis: Secondary | ICD-10-CM | POA: Diagnosis not present

## 2018-11-08 DIAGNOSIS — K766 Portal hypertension: Secondary | ICD-10-CM | POA: Diagnosis not present

## 2018-11-08 DIAGNOSIS — J449 Chronic obstructive pulmonary disease, unspecified: Secondary | ICD-10-CM | POA: Diagnosis not present

## 2018-11-08 DIAGNOSIS — R062 Wheezing: Secondary | ICD-10-CM | POA: Diagnosis not present

## 2018-11-08 DIAGNOSIS — Z794 Long term (current) use of insulin: Secondary | ICD-10-CM | POA: Diagnosis not present

## 2018-11-08 DIAGNOSIS — I130 Hypertensive heart and chronic kidney disease with heart failure and stage 1 through stage 4 chronic kidney disease, or unspecified chronic kidney disease: Secondary | ICD-10-CM | POA: Diagnosis not present

## 2018-11-08 DIAGNOSIS — I2721 Secondary pulmonary arterial hypertension: Secondary | ICD-10-CM | POA: Diagnosis not present

## 2018-11-08 DIAGNOSIS — J9621 Acute and chronic respiratory failure with hypoxia: Secondary | ICD-10-CM | POA: Diagnosis not present

## 2018-11-08 DIAGNOSIS — I959 Hypotension, unspecified: Secondary | ICD-10-CM | POA: Diagnosis not present

## 2018-11-08 DIAGNOSIS — Z803 Family history of malignant neoplasm of breast: Secondary | ICD-10-CM | POA: Diagnosis not present

## 2018-11-08 DIAGNOSIS — M2550 Pain in unspecified joint: Secondary | ICD-10-CM | POA: Diagnosis not present

## 2018-11-08 DIAGNOSIS — I509 Heart failure, unspecified: Secondary | ICD-10-CM | POA: Diagnosis not present

## 2018-11-08 DIAGNOSIS — E876 Hypokalemia: Secondary | ICD-10-CM | POA: Diagnosis not present

## 2018-11-08 DIAGNOSIS — Z7982 Long term (current) use of aspirin: Secondary | ICD-10-CM | POA: Diagnosis not present

## 2018-11-08 DIAGNOSIS — J9622 Acute and chronic respiratory failure with hypercapnia: Secondary | ICD-10-CM | POA: Diagnosis not present

## 2018-11-08 DIAGNOSIS — I5189 Other ill-defined heart diseases: Secondary | ICD-10-CM | POA: Diagnosis not present

## 2018-11-08 DIAGNOSIS — R222 Localized swelling, mass and lump, trunk: Secondary | ICD-10-CM | POA: Diagnosis not present

## 2018-11-08 DIAGNOSIS — E8770 Fluid overload, unspecified: Secondary | ICD-10-CM | POA: Diagnosis not present

## 2018-11-08 DIAGNOSIS — I110 Hypertensive heart disease with heart failure: Secondary | ICD-10-CM | POA: Diagnosis not present

## 2018-11-08 DIAGNOSIS — Z79899 Other long term (current) drug therapy: Secondary | ICD-10-CM | POA: Diagnosis not present

## 2018-11-08 DIAGNOSIS — J9690 Respiratory failure, unspecified, unspecified whether with hypoxia or hypercapnia: Secondary | ICD-10-CM | POA: Diagnosis not present

## 2018-11-08 DIAGNOSIS — Z853 Personal history of malignant neoplasm of breast: Secondary | ICD-10-CM | POA: Diagnosis not present

## 2018-11-08 LAB — BASIC METABOLIC PANEL
Anion gap: 10 (ref 5–15)
BUN: 35 mg/dL — ABNORMAL HIGH (ref 8–23)
CO2: 32 mmol/L (ref 22–32)
Calcium: 8.7 mg/dL — ABNORMAL LOW (ref 8.9–10.3)
Chloride: 92 mmol/L — ABNORMAL LOW (ref 98–111)
Creatinine, Ser: 1.33 mg/dL — ABNORMAL HIGH (ref 0.44–1.00)
GFR calc Af Amer: 47 mL/min — ABNORMAL LOW (ref >60)
GFR calc non Af Amer: 41 mL/min — ABNORMAL LOW (ref >60)
Glucose, Bld: 195 mg/dL — ABNORMAL HIGH (ref 70–99)
Potassium: 4.1 mmol/L (ref 3.5–5.1)
Sodium: 134 mmol/L — ABNORMAL LOW (ref 135–145)

## 2018-11-08 LAB — GLUCOSE, CAPILLARY
Glucose-Capillary: 177 mg/dL — ABNORMAL HIGH (ref 70–99)
Glucose-Capillary: 274 mg/dL — ABNORMAL HIGH (ref 70–99)

## 2018-11-08 LAB — MAGNESIUM: Magnesium: 1.9 mg/dL (ref 1.7–2.4)

## 2018-11-08 MED ORDER — TORSEMIDE 20 MG PO TABS
60.0000 mg | ORAL_TABLET | Freq: Every day | ORAL | Status: DC
  Administered 2018-11-08: 60 mg via ORAL
  Filled 2018-11-08: qty 3

## 2018-11-08 MED ORDER — POTASSIUM CHLORIDE CRYS ER 20 MEQ PO TBCR: 20.0000 meq | EXTENDED_RELEASE_TABLET | Freq: Two times a day (BID) | ORAL | 0 refills | Status: DC

## 2018-11-08 MED ORDER — INSULIN ASPART 100 UNIT/ML ~~LOC~~ SOLN: SUBCUTANEOUS | 0 refills | Status: AC

## 2018-11-08 MED ORDER — HEPARIN SOD (PORK) LOCK FLUSH 100 UNIT/ML IV SOLN
500.0000 [IU] | INTRAVENOUS | Status: AC | PRN
  Administered 2018-11-08: 500 [IU]

## 2018-11-08 MED ORDER — MIDODRINE HCL 5 MG PO TABS
5.0000 mg | ORAL_TABLET | Freq: Three times a day (TID) | ORAL | Status: DC
  Administered 2018-11-08: 5 mg via ORAL
  Filled 2018-11-08: qty 1

## 2018-11-08 MED ORDER — MIDODRINE HCL 5 MG PO TABS: 5.0000 mg | ORAL_TABLET | Freq: Three times a day (TID) | ORAL | 0 refills | Status: DC

## 2018-11-08 MED ORDER — AMIODARONE HCL 100 MG PO TABS: 100.0000 mg | ORAL_TABLET | Freq: Every day | ORAL | 0 refills | Status: DC

## 2018-11-08 MED ORDER — MAGNESIUM SULFATE 2 GM/50ML IV SOLN
2.0000 g | Freq: Once | INTRAVENOUS | Status: AC
  Administered 2018-11-08: 2 g via INTRAVENOUS
  Filled 2018-11-08: qty 50

## 2018-11-08 MED ORDER — SPIRONOLACTONE 25 MG PO TABS: 12.5000 mg | ORAL_TABLET | Freq: Every day | ORAL | 0 refills | Status: DC

## 2018-11-08 MED ORDER — TORSEMIDE 20 MG PO TABS: 60.0000 mg | ORAL_TABLET | Freq: Every day | ORAL | 0 refills | Status: DC

## 2018-11-08 NOTE — Discharge Summary (Signed)
Physician Discharge Summary  Tammy Mathews HKV:[id number] DOB: November 14, 1950 DOA: 10/27/2018  PCP: Greig Right, MD  Admit date: 10/27/2018 Discharge date: 11/08/2018  Admitted From: [facility] Disposition:  SNF  Recommendations for Outpatient Follow-up:  1. Follow up with PCP in 1-2 weeks 2. Recommend repeat BMET in 1 week  Discharge Condition:Improved CODE STATUS:Full Diet recommendation: Diabetic, heart healthy   Brief/Interim Summary: [name], transferred from another hospital to [facility] due to concerns for worsening renal function and shortness of breath. HX includes breast cancer, morbid obesity, hypertension, hyperlipidemia, diabetes mellitus, chronic kidney disease and COPD on 3 L of oxygen. Patient was reported to have left-sided chest pain, shortness of breath, dyspnea on exertion, orthopnea, leg edema and 50 pound weight gain in the last month.  Previous Echocardiogram reveals moderate to severe pulmonary hypertension with likely diastolic dysfunction.She was admitted to cardiology floor 6/21 but has gotten progressively worsening shortness of breath, needing BIPAP . Lasix drip initiated by nephrology and thus far very poor urine output.  Discharge Diagnoses:  Principal Problem:   Acute on chronic respiratory failure with hypoxia and hypercapnia (HCC) Active Problems:   Volume overload   AKI (acute kidney injury) ([place])   CKD (chronic kidney disease)   Chronic diastolic CHF (congestive heart failure) (HCC)   COPD (chronic obstructive pulmonary disease) (HCC)   Acute respiratory distress   Acute-on-chronic renal failure (HCC)   Palliative care by specialist   Goals of care, counseling/discussion  Acute on chronic diastolic heart failure Severe pulmonary hypertension Right sided heart failure, cor pulmonale Cardiogenic shock requiring vasopressor support - BP stabilized off pressors and noted to be controlled at this time -appreciate  cards assistance; Pt now s/p RHC 7/1 with findings concerning for severe pulm HTN and on-going vol overload    - Patient was aggressively diuresed this admit, transitioned to PO torsemide as of 7/3 - Cardiology decreased amiodarone to 163m daily and midodrine to 535mtid with meals  Cardiorenal syndrome AKI on CKD CKD stage III at baseline - diuresis as tolerated by patient; appreciate nephro direction - now transitioned to PO diuretic     - Recommend repeat bmet in one week  Electrolyte imbalances secondary to diuresis - Repeat bmet in one week     - goal K >4 and Mg >2  Acute on chronic hypoxemic hypercarbic respiratory failure - CPAP/BiPAP at night; O2 support during the day; wean as able    - Seems stable at present  Diabetes type 2 - CBGs with SSI - glucose trends reviewed. Glucose currently stable  Vertigo - Continued meclazine    -  Presently stable stable at this time  Normocytic anemia - stable, no frank bleed noted - iron of 26 noted  Morbid obesity - recommend diet/lifestyle modification  Debility - PT consult recs SNF  Chronic periodontitis with severe bone loss Generalized gingival recession Supra eruption and drifting of the unopposed teeth into the edentulous areas - eval'd by dental team earlier; patient has denied surgical correction at this time - per dental team:The patient may be willing to proceed with extractions [place] a [place] is more medically stable after this admission. The patient will then follow-up with the general dentist of her choice for fabrication of upper and lower complete dentures after adequate healing. Patient is aware that due to her significant atrophy of the edentulous alveolar ridges, the prognosis for successful upper and lower complete denture fabrication is significantly limited. Patient  is aware of implant options but  has limited economic resources at this time. Patient is aware that Medicaid should cover the cost associated with the dental extraction procedures with an oral surgeon.I will assist the patient in follow-up with the oral surgeon if so instructed by the patient.  COPD - home regimen: pulmicort/albuterol    - Remains stable at this time  Discharge Instructions   Allergies as of 11/08/2018      Reactions   Codeine Nausea And Vomiting, Other (See Comments)   Unknown   Morphine And Related Nausea And Vomiting      Medication List    STOP taking these medications   bisoprolol 5 MG tablet Commonly known as: ZEBETA   insulin glargine 100 UNIT/ML injection Commonly known as: LANTUS   insulin lispro 100 UNIT/ML injection Commonly known as: HUMALOG   olmesartan 40 MG tablet Commonly known as: BENICAR     TAKE these medications   Accu-Chek Aviva Plus test strip Generic drug: glucose blood   acetaminophen 500 MG tablet Commonly known as: TYLENOL Take 1,000 mg by mouth every 6 (six) hours as needed for mild pain.   albuterol 108 (90 Base) MCG/ACT inhaler Commonly known as: VENTOLIN HFA Inhale 2 puffs into the lungs every 4 (four) hours as needed for wheezing or shortness of breath.   amiodarone 100 MG tablet Commonly known as: PACERONE Take 1 tablet (100 mg total) by mouth daily. Start taking on: November 09, 2018 What changed:   medication strength  how much to take   anastrozole 1 MG tablet Commonly known as: ARIMIDEX Take 1 mg by mouth daily.   aspirin EC 81 MG tablet Take 81 mg by mouth daily.   atorvastatin 20 MG tablet Commonly known as: LIPITOR Take 20 mg by mouth daily.   budesonide 0.5 MG/2ML nebulizer solution Commonly known as: PULMICORT Inhale 2 mLs into the lungs every 6 (six) hours as needed for shortness of breath.   budesonide-formoterol 80-4.5 MCG/ACT inhaler Commonly known as: Symbicort Take 2 puffs first thing in am and then another 2  puffs about 12 hours later.   insulin aspart 100 UNIT/ML injection Commonly known as: NovoLOG Sliding scale for glucose:  CBG < 70: Implement Hypoglycemia Protocol  CBG 70 - 120: 0 units  CBG 121 - 150: 1 unit  CBG 151 - 200: 2 units  CBG 201 - 250: 3 units  CBG 251 - 300: 5 units  CBG 301 - 350: 7 units  CBG 351 - 400 9 units  CBG > 400 Notify facility MD   ipratropium-albuterol 0.5-2.5 (3) MG/3ML Soln Commonly known as: DUONEB Take 3 mLs by nebulization every 4 (four) hours as needed.   loratadine 10 MG tablet Commonly known as: CLARITIN Take 10 mg by mouth daily as needed for allergies.   meclizine 25 MG tablet Commonly known as: ANTIVERT Take 25 mg by mouth every 6 (six) hours as needed for dizziness.   midodrine 5 MG tablet Commonly known as: PROAMATINE Take 1 tablet (5 mg total) by mouth 3 (three) times daily with meals.   OXYGEN 3 L continuous. AHC   potassium chloride SA 20 MEQ tablet Commonly known as: K-DUR Take 1 tablet (20 mEq total) by mouth 2 (two) times daily.   spironolactone 25 MG tablet Commonly known as: ALDACTONE Take 0.5 tablets (12.5 mg total) by mouth daily. Start taking on: November 09, 2018   torsemide 20 MG tablet Commonly known as: DEMADEX Take 3 tablets (60  mg total) by mouth daily. Start taking on: November 09, 2018 What changed: how much to take       Contact information for follow-up providers    [facility] on 11/28/2018.   Specialty: Cardiology Why: 12:00 PM, parking code [id number] Contact information: [address] [phone] Danice Goltz [place] [phone]           Contact information for after-discharge care    [facility] .   Service: Skilled Nursing Contact information: [address] [place] [address] [phone]                 Allergies  Allergen Reactions  . Codeine Nausea And  Vomiting and Other (See Comments)    Unknown   . Morphine And Related Nausea And Vomiting    Consultations:  Cardiology  Nephrology  Procedures/Studies: Dg Orthopantogram  Result Date: 10/30/2018 CLINICAL DATA:  Chronic periodontitis. EXAM: ORTHOPANTOGRAM/PANORAMIC COMPARISON:  None. FINDINGS: Poor dentition is noted. No fracture or dislocation is noted. No lytic lesion is noted. IMPRESSION: Poor dentition.  No significant mandibular abnormality is noted. Electronically Signed   By: Marijo Conception M.D.   On: 10/30/2018 10:57   Dg Chest 2 View  Result Date: 11/04/2018 CLINICAL DATA:  CHF.  History of hypertension. EXAM: CHEST - 2 VIEW COMPARISON:  Chest x-ray 10/31/2018 FINDINGS: The right subclavian power port is stable. Very low lung volumes with vascular crowding and atelectasis. There is moderate vascular congestion without overt pulmonary edema. Stable elevation right hemidiaphragm and overlying small pleural effusion. Persistent streaky areas of subsegmental atelectasis. IMPRESSION: Cardiac enlargement and vascular congestion without overt pulmonary edema. Elevation right hemidiaphragm with overlying small effusion. Persistent areas of subsegmental atelectasis. Electronically Signed   By: Marijo Sanes M.D.   On: 11/04/2018 15:52   Nm Pulmonary Perfusion  Result Date: 11/04/2018 CLINICAL DATA:  Pulmonary hypertension.  Shortness of breath. EXAM: NUCLEAR MEDICINE PERFUSION LUNG SCAN TECHNIQUE: Perfusion images were obtained in multiple projections after intravenous injection of radiopharmaceutical. Ventilation scans intentionally deferred if perfusion scan and chest x-ray adequate for interpretation during COVID 19 epidemic. The patient declined further as she was unable to lie flat. RADIOPHARMACEUTICALS:  1.5 mCi Tc-47mMAA IV COMPARISON:  Chest radiograph 10/31/2018. FINDINGS: Single moderate size perfusion defect is identified within the left midlung. This corresponds to an area of  subsegmental atelectasis or scarring noted on previous chest radiograph. Asymmetric elevation of right hemidiaphragm is noted corresponding to the chest radiograph. IMPRESSION: 1. There is a single moderate size perfusion defect within the left midlung. Although indeterminate a corresponding area of scar/subsegmental atelectasis is noted within the left midlung. Given the absence of ventilation images, modified PIOPED criteria do not apply. No additional perfusion defects identified to suggest multiple pulmonary emboli. Electronically Signed   By: TKerby MoorsM.D.   On: 11/04/2018 15:50   Ct Chest High Resolution  Result Date: 11/04/2018 CLINICAL DATA:  Pulmonary hypertension suspected EXAM: CT CHEST WITHOUT CONTRAST TECHNIQUE: Multidetector CT imaging of the chest was performed following the standard protocol without intravenous contrast. High resolution imaging of the lungs, as well as inspiratory and expiratory imaging, was performed. COMPARISON:  CT chest, 10/24/2018 FINDINGS: Cardiovascular: Cardiomegaly. Left coronary artery calcifications. The main pulmonary artery is enlarged, measuring up to 4.2 cm in caliber. No pericardial effusion. Right subclavian port catheter. Mediastinum/Nodes: No enlarged mediastinal, hilar, or axillary lymph nodes. Thyroid gland,  trachea, and esophagus demonstrate no significant findings. Lungs/Pleura: Bibasilar consolidation or atelectasis, similar to prior examination and most conspicuous in the right lung base. Small pleural effusions, similar to prior examination. No significant air trapping on expiratory phase imaging. Upper Abdomen: No acute abnormality. Coarse contour of the liver. Trace perihepatic ascites. Splenomegaly. Musculoskeletal: Status post left mastectomy. There is a spiculated mass in the left axilla containing a biopsy clip (series 4, image 22). IMPRESSION: 1. Bibasilar consolidation or atelectasis, similar to prior examination and most conspicuous in the  right lung base. Small pleural effusions, similar to prior examination. No significant air trapping on expiratory phase imaging. No apparent evidence of fibrotic interstitial lung disease, however the presence of effusions and atelectasis significantly limits evaluation for subtle changes. 2. The main pulmonary artery is enlarged, measuring up to 4.2 cm in caliber, which can be seen in pulmonary hypertension. 3.  Coronary artery disease. 4. There is a spiculated mass in the left axilla containing a biopsy clip (series 4, image 22). Correlate with mammographic evaluation. 5. Coarse contour of the liver and splenomegaly, findings suggestive of hepatic cirrhosis. Electronically Signed   By: Eddie Candle M.D.   On: 11/04/2018 17:52   Dg Chest Port 1 View  Result Date: 10/31/2018 CLINICAL DATA:  Shortness of breath, cough. EXAM: PORTABLE CHEST 1 VIEW COMPARISON:  Radiographs of October 27, 2018. FINDINGS: Stable cardiomediastinal silhouette. Right subclavian Port-A-Cath is unchanged in position. No pneumothorax is noted. Stable bilateral opacities are noted most consistent with subsegmental atelectasis possibly infiltrates, right greater than left. Mild right pleural effusion is noted. Bony thorax is unremarkable. IMPRESSION: Stable bilateral lung opacities as described above with probable small right pleural effusion. Electronically Signed   By: Marijo Conception M.D.   On: 10/31/2018 08:49   Dg Chest Port 1 View  Result Date: 10/27/2018 CLINICAL DATA:  68 year old female with acute respiratory distress. EXAM: PORTABLE CHEST 1 VIEW COMPARISON:  None. FINDINGS: Right lung base densities may represent atelectasis or infiltrate. Linear density in the left mid lung field may represent atelectasis/scarring although infiltrate is not entirely excluded. A small right pleural effusion may be present. There is no pneumothorax. Mild cardiomegaly. Right pectoral Port-A-Cath tip close to the cavoatrial junction. No acute osseous  pathology. IMPRESSION: 1. Right lung base atelectasis versus infiltrate. 2. Left mid lung field atelectasis/scarring versus infiltrate. 3. Possible small right pleural effusion. Electronically Signed   By: Anner Crete M.D.   On: 10/27/2018 21:44   Vas [place] Lower Extremity Venous (dvt)  Result Date: 10/29/2018  Lower Venous Study Indications: Swelling, and SOB.  Limitations: Body habitus and poor ultrasound/tissue interface. Comparison Study: no prior Performing Technologist: Abram Sander RVS  Examination Guidelines: A complete evaluation includes B-mode imaging, spectral Doppler, color Doppler, and power Doppler as needed of all accessible portions of each vessel. Bilateral testing is considered an integral part of a complete examination. Limited examinations for reoccurring indications may be performed as noted.  +---------+---------------+---------+-----------+----------+--------------+ RIGHT    CompressibilityPhasicitySpontaneityPropertiesSummary        +---------+---------------+---------+-----------+----------+--------------+ CFV      Full           Yes      Yes                                 +---------+---------------+---------+-----------+----------+--------------+ SFJ      Full                                                        +---------+---------------+---------+-----------+----------+--------------+  FV Prox  Full                                                        +---------+---------------+---------+-----------+----------+--------------+ FV Mid                                                Not visualized +---------+---------------+---------+-----------+----------+--------------+ FV Distal                                             Not visualized +---------+---------------+---------+-----------+----------+--------------+ PFV      Full                                                         +---------+---------------+---------+-----------+----------+--------------+ POP      Full           Yes      Yes                                 +---------+---------------+---------+-----------+----------+--------------+ PTV                                                   Not visualized +---------+---------------+---------+-----------+----------+--------------+ PERO                                                  Not visualized +---------+---------------+---------+-----------+----------+--------------+   +---------+---------------+---------+-----------+----------+--------------+ LEFT     CompressibilityPhasicitySpontaneityPropertiesSummary        +---------+---------------+---------+-----------+----------+--------------+ CFV      Full           Yes      Yes                                 +---------+---------------+---------+-----------+----------+--------------+ SFJ      Full           Yes      Yes                                 +---------+---------------+---------+-----------+----------+--------------+ FV Prox  Full                                                        +---------+---------------+---------+-----------+----------+--------------+ FV Mid  Not visualized +---------+---------------+---------+-----------+----------+--------------+ FV Distal                                             Not visualized +---------+---------------+---------+-----------+----------+--------------+ PFV      Full                                                        +---------+---------------+---------+-----------+----------+--------------+ POP                                                   Not visualized +---------+---------------+---------+-----------+----------+--------------+ PTV                                                   Not visualized  +---------+---------------+---------+-----------+----------+--------------+     Summary: Right: There is no evidence of deep vein thrombosis in the lower extremity. However, portions of this examination were limited- see technologist comments above. No cystic structure found in the popliteal fossa. Left: There is no evidence of deep vein thrombosis in the lower extremity. However, portions of this examination were limited- see technologist comments above. No cystic structure found in the popliteal fossa.  *See table(s) above for measurements and observations. Electronically signed by Deitra Mayo MD on 10/29/2018 at 11:45:30 AM.    Final      Subjective: Without complaints this AM  Discharge Exam: Vitals:   11/08/18 1121 11/08/18 1200  BP: (!) 120/53   Pulse: 87 93  Resp: (!) 23 (!) 33  Temp: 97.6 F (36.4 C)   SpO2: 98% 100%   Vitals:   11/08/18 0735 11/08/18 0905 11/08/18 1121 11/08/18 1200  BP: (!) 103/46  (!) 120/53   Pulse: 84  87 93  Resp: (!) 25  (!) 23 (!) 33  Temp: 98.4 F (36.9 C)  97.6 F (36.4 C)   TempSrc: Oral  Oral   SpO2: 93% 100% 98% 100%  Weight:      Height:        General: Pt is alert, awake, not in acute distress Cardiovascular: RRR, S1/S2 +, no rubs, no gallops Respiratory: CTA bilaterally, no wheezing, no rhonchi Abdominal: Soft, NT, ND, bowel sounds + Extremities: no edema, no cyanosis   The results of significant diagnostics from this hospitalization (including imaging, microbiology, ancillary and laboratory) are listed below for reference.     Microbiology: Recent Results (from the past 240 hour(s))  Novel Coronavirus, NAA (hospital order; send-out to ref lab)     Status: None   Collection Time: 11/05/18  3:07 PM  Result Value Ref Range Status   SARS-CoV-2, NAA NOT DETECTED NOT DETECTED Final    Comment: (NOTE) This test was developed and its performance characteristics determined by [employer]. This test has not been  FDA cleared or approved. This test has been authorized by FDA under an Emergency Use Authorization (EUA). This test is only authorized for the duration of time the declaration that circumstances exist  justifying the authorization of the emergency use of in vitro diagnostic tests for detection of SARS-CoV-2 virus and/or diagnosis of COVID-19 infection under section 564(b)(1) of the Act, 21 U.S.C. 932IZT-2(W)(5), unless the authorization is terminated or revoked sooner. When diagnostic testing is negative, the possibility of a false negative result should be considered in the context of a patient's recent exposures and the presence of clinical signs and symptoms consistent with COVID-19. An individual without symptoms of COVID-19 and who is not shedding SARS-CoV-2 virus would expect to have a negative (not detected) result in this assay. Performed  At: [facility] [address] [place], [place] [id number] Rush Farmer MD NK:[id number]    [place]  Final    Comment: Performed at [facility], [place] [address] [place], [place] [address]     Labs: BNP (last 3 results) Recent Labs    10/27/18 2210  BNP 409.7*   Basic Metabolic Panel: Recent Labs  Lab 11/02/18 0703  11/03/18 1212 11/04/18 0338 11/05/18 0553 11/06/18 0401 11/06/18 0907 11/07/18 0447 11/08/18 0430  NA 142   < >  --  139 138 137 139  141 137 134*  K 3.5   < >  --  3.6 4.0 4.2 4.1  4.0 4.0 4.1  CL 85*   < >  --  89* 92* 92*  --  94* 92*  CO2 44*   < >  --  42* 36* 34*  --  35* 32  GLUCOSE 145*   < >  --  169* 158* 161*  --  167* 195*  BUN 59*   < >  --  42* 35* 32*  --  32* 35*  CREATININE 1.78*   < >  --  1.42* 1.35* 1.30*  --  1.36* 1.33*  CALCIUM 9.0   < >  --  8.8* 8.9 8.9  --  9.0 8.7*  MG 1.9  --  1.8  --  1.9 2.1  --   --  1.9  PHOS  --   --   --   --   --  2.3*  --   --   --    < > = values in this interval not displayed.   Liver Function Tests: Recent  Labs  Lab 11/06/18 0401  ALBUMIN 2.8*   No results for input(s): LIPASE, AMYLASE in the last 168 hours. No results for input(s): AMMONIA in the last 168 hours. CBC: Recent Labs  Lab 11/04/18 0338 11/05/18 0553 11/06/18 0401 11/06/18 0907  WBC 4.4 5.2 4.9  --   NEUTROABS  --  3.7 3.5  --   HGB 8.7* 8.7* 9.2* 11.2*  10.9*  HCT 29.8* 29.5* 30.8* 33.0*  32.0*  MCV 89.2 89.1 89.0  --   PLT 147* 145* 161  --    Cardiac Enzymes: No results for input(s): CKTOTAL, CKMB, CKMBINDEX, TROPONINI in the last 168 hours. BNP: Invalid input(s): POCBNP CBG: Recent Labs  Lab 11/07/18 1111 11/07/18 1631 11/07/18 2103 11/08/18 0631 11/08/18 1127  GLUCAP 199* 181* 200* 177* 274*   D-Dimer No results for input(s): DDIMER in the last 72 hours. Hgb A1c No results for input(s): HGBA1C in the last 72 hours. Lipid Profile No results for input(s): CHOL, HDL, LDLCALC, TRIG, CHOLHDL, LDLDIRECT in the last 72 hours. Thyroid function studies No results for input(s): TSH, T4TOTAL, T3FREE, THYROIDAB in the last 72 hours.  Invalid input(s): FREET3 Anemia work up No results for input(s): VITAMINB12, FOLATE,  FERRITIN, TIBC, IRON, RETICCTPCT in the last 72 hours. Urinalysis    Component Value Date/Time   COLORURINE YELLOW 11/03/2018 1212   APPEARANCEUR CLOUDY (A) 11/03/2018 1212   LABSPEC 1.009 11/03/2018 1212   PHURINE 8.0 11/03/2018 1212   GLUCOSEU NEGATIVE 11/03/2018 1212   HGBUR MODERATE (A) 11/03/2018 1212   BILIRUBINUR NEGATIVE 11/03/2018 1212   KETONESUR NEGATIVE 11/03/2018 1212   PROTEINUR 100 (A) 11/03/2018 1212   NITRITE NEGATIVE 11/03/2018 1212   LEUKOCYTESUR LARGE (A) 11/03/2018 1212   Sepsis Labs Invalid input(s): PROCALCITONIN,  WBC,  LACTICIDVEN Microbiology Recent Results (from the past 240 hour(s))  Novel Coronavirus, NAA (hospital order; send-out to ref lab)     Status: None   Collection Time: 11/05/18  3:07 PM  Result Value Ref Range Status   SARS-CoV-2, NAA NOT  DETECTED NOT DETECTED Final    Comment: (NOTE) This test was developed and its performance characteristics determined by [employer]. This test has not been FDA cleared or approved. This test has been authorized by FDA under an Emergency Use Authorization (EUA). This test is only authorized for the duration of time the declaration that circumstances exist justifying the authorization of the emergency use of in vitro diagnostic tests for detection of SARS-CoV-2 virus and/or diagnosis of COVID-19 infection under section 564(b)(1) of the Act, 21 U.S.C. 118AQL-7(J)(7), unless the authorization is terminated or revoked sooner. When diagnostic testing is negative, the possibility of a false negative result should be considered in the context of a patient's recent exposures and the presence of clinical signs and symptoms consistent with COVID-19. An individual without symptoms of COVID-19 and who is not shedding SARS-CoV-2 virus would expect to have a negative (not detected) result in this assay. Performed  At: [facility] [address] [place], [place] [id number] Rush Farmer MD MR:[id number]    [place]  Final    Comment: Performed at [facility], [place] [address] [place], [place] [address]   Time spent: 30 min  SIGNED:   Marylu Lund, MD  [facility] 11/08/2018, 12:45 PM  If 7PM-7AM, please contact night-coverage

## 2018-11-08 NOTE — Progress Notes (Signed)
Report given to [facility]. Staff.

## 2018-11-08 NOTE — TOC Transition Note (Signed)
Transition of Care [facility]) - CM/SW Discharge Note   Patient Details  Name: Tammy Mathews MRN: [id number] Date of Birth: 30-Sep-1950  Transition of Care [facility]) CM/SW Contact:  Candie Chroman, LCSW Phone Number: 11/08/2018, 1:29 PM   Clinical Narrative: CSW facilitated patient discharge including contacting patient family and facility to confirm patient discharge plans. Clinical information faxed to facility and family agreeable with plan. CSW arranged ambulance transport via [facility] to [facility] at 3:00. RN to call report prior to discharge [phone] Room 110).  CSW will sign off for now as social work intervention is no longer needed. Please consult [place] again if new needs arise.  Final next level of care: Skilled Nursing Facility Barriers to Discharge: Barriers Resolved   Patient Goals and CMS Choice Patient states their goals for this hospitalization and ongoing recovery are:: "To walk so I can go home." CMS Medicare.gov Compare Post Acute Care list provided to:: Patient Choice offered to / list presented to : Patient  Discharge Placement   Existing PASRR number confirmed : 11/04/18          Patient chooses bed at: [facility]) Patient to be transferred to facility by: [place] Name of family member notified: Left voicemail for sister Elvia Collum Patient and family notified of of transfer: 11/08/18  Discharge Plan and Services     Post Acute Care Choice: [place]                               Social Determinants of Health (SDOH) Interventions     Readmission Risk Interventions No flowsheet data found.

## 2018-11-08 NOTE — Progress Notes (Signed)
Patient ID: Tammy Mathews, female   DOB: [birth year], 68 y.o.   MRN: [id number]     Advanced Heart Failure Rounding Note  PCP-Cardiologist: No primary care provider on file.   Subjective:    Excellent UOP on IV Lasix again yesterday, weight now down 74 lbs overall.  Breathing better.  Creatinine stable.   RHC Procedural Findings (11/06/18): Hemodynamics (mmHg) RA mean 14 RV 77/16 PA 80/34, mean 53 PCWP mean 21 Oxygen saturations: PA 68% AO 96% Cardiac Output (Fick) 7.96  Cardiac Index (Fick) 3.8 PVR 4 WU   Objective:   Weight Range: 110.1 kg Body mass index is 45.86 kg/m.   Vital Signs:   Temp:  [97.6 F (36.4 C)-98.5 F (36.9 C)] 98.4 F (36.9 C) (07/03 0735) Pulse Rate:  [69-88] 84 (07/03 0735) Resp:  [15-26] 25 (07/03 0735) BP: (103-139)/(46-66) 103/46 (07/03 0735) SpO2:  [93 %-100 %] 93 % (07/03 0735) Weight:  [110.1 kg] 110.1 kg (07/03 0330) Last BM Date: 12/03/18  Weight change: Filed Weights   11/06/18 0300 11/07/18 0253 11/08/18 0330  Weight: 115.7 kg 111.1 kg 110.1 kg    Intake/Output:   Intake/Output Summary (Last 24 hours) at 11/08/2018 0834 Last data filed at 11/08/2018 0150 Gross per 24 hour  Intake 777 ml  Output 4450 ml  Net -3673 ml      Physical Exam    General: NAD Neck: JVP 8 cm, no thyromegaly or thyroid nodule.  Lungs: Decreased BS at bases.  CV: Nondisplaced PMI.  Heart regular S1/S2, no S3/S4, no murmur.  Unna boots LEs with minimal edema.  Abdomen: Soft, nontender, no hepatosplenomegaly, no distention.  Skin: Intact without lesions or rashes.  Neurologic: Alert and oriented x 3.  Psych: Normal affect. Extremities: No clubbing or cyanosis.  HEENT: Normal.    Telemetry   NSR 80s (personally reviewed)  EKG    N/A  Labs    CBC Recent Labs    11/06/18 0401 11/06/18 0907  WBC 4.9  --   NEUTROABS 3.5  --   HGB 9.2* 11.2*  10.9*  HCT 30.8* 33.0*  32.0*  MCV 89.0  --   PLT 161  --    Basic Metabolic Panel Recent  Labs    11/06/18 0401  11/07/18 0447 11/08/18 0430  NA 137   < > 137 134*  K 4.2   < > 4.0 4.1  CL 92*  --  94* 92*  CO2 34*  --  35* 32  GLUCOSE 161*  --  167* 195*  BUN 32*  --  32* 35*  CREATININE 1.30*  --  1.36* 1.33*  CALCIUM 8.9  --  9.0 8.7*  MG 2.1  --   --  1.9  PHOS 2.3*  --   --   --    < > = values in this interval not displayed.   Liver Function Tests Recent Labs    11/06/18 0401  ALBUMIN 2.8*   No results for input(s): LIPASE, AMYLASE in the last 72 hours. Cardiac Enzymes No results for input(s): CKTOTAL, CKMB, CKMBINDEX, TROPONINI in the last 72 hours.  BNP: BNP (last 3 results) Recent Labs    10/27/18 2210  BNP 778.0*    ProBNP (last 3 results) No results for input(s): PROBNP in the last 8760 hours.   D-Dimer No results for input(s): DDIMER in the last 72 hours. Hemoglobin A1C No results for input(s): HGBA1C in the last 72 hours. Fasting Lipid Panel No results for input(s): CHOL,  HDL, LDLCALC, TRIG, CHOLHDL, LDLDIRECT in the last 72 hours. Thyroid Function Tests No results for input(s): TSH, T4TOTAL, T3FREE, THYROIDAB in the last 72 hours.  Invalid input(s): FREET3  Other results:   Imaging    No results found.   Medications:     Scheduled Medications: . amiodarone  100 mg Oral Daily  . aspirin EC  81 mg Oral Daily  . atorvastatin  20 mg Oral Daily  . Chlorhexidine Gluconate Cloth  6 each Topical Daily  . feeding supplement (GLUCERNA SHAKE)  237 mL Oral BID BM  . heparin  5,000 Units Subcutaneous Q8H  . insulin aspart  0-5 Units Subcutaneous QHS  . insulin aspart  0-9 Units Subcutaneous TID WC  . mouth rinse  15 mL Mouth Rinse BID  . midodrine  10 mg Oral TID WC  . mometasone-formoterol  2 puff Inhalation BID  . neomycin-polymyxin-hydrocortisone  3 drop Right EAR Q6H  . potassium chloride  20 mEq Oral BID  . sodium chloride flush  3 mL Intravenous Q12H  . spironolactone  12.5 mg Oral Daily  . torsemide  60 mg Oral Daily     Infusions: . sodium chloride Stopped (10/31/18 0830)  . magnesium sulfate bolus IVPB      PRN Medications: sodium chloride, acetaminophen **OR** acetaminophen, ipratropium-albuterol, meclizine, ondansetron, polyvinyl alcohol, sodium chloride flush   Assessment/Plan   1. Acute on chronic hypoxemic respiratory failure: She has been labeled as having COPD but never smoked.  Based on body habitus, OHS/OSA is a possibility.  She is on 3 L home oxygen.  She is unable to tolerate CPAP.  2. Acute on chronic diastolic CHF with prominent RV failure: Echo (6/20) with EF 60-65%, severely dilated RV with moderately decreased RV systolic function, moderate TR, PASP 85 mmHg. She was markedly volume overloaded on exam initially and has diuresed well.  [place] 7/1 showed severe pulmonary artery hypertension with ongoing volume overload. She diuresed well back on IV Lasix for the last 2 days, looks near-euvolemic now. Renal function stable.  - Transition to torsemide 60 mg daily.    - Midodrine being used to maintain BP.  Would decrease to 5 mg tid now that not aggressively diuresing, may be able to stop at followup. 3. Atrial fibrillation: Paroxysmal.  She has only been in NSR here.  Apparently she was on anticoagulation (not sure what) a while ago and had severe epistaxis.  She stopped anticoagulation and does not want to restart. She is on ASA currently.  - Continue amiodarone to try to maintain NSR. I decreased amiodarone to 100 mg daily.  May be able to stop in the future. CT chest is not suggestive of amiodarone lung toxicity.  4. Pulmonary hypertension: Severe pulmonary hypertension by echo with RV dysfunction.  Etiology uncertain.  She has chronic hypoxemic respiratory failure.  This certainly could be due to long-standing pulmonary hypertension, but also possibly due to OSH/OSA.  She has never smoked so COPD unlikely. High resolution CT chest did not show evidence for ILD, there was bibasilar atelectasis.   V/Q scan with perfusion defect likely due to atelectasis. Discussed with radiology, unlikely PE and do not recommend further workup. Serologic workup for autoimmune causes of pulmonary hypertension was negative.  RHC shows severe pulmonary arterial hypertension.  I suspect that she has a combination of group 1 and group 3 (from OSA/OSA) PH.   -  I think she would be a reasonable candidate for a trial of pulmonary vasodilators.  Will  arrange for this as outpatient as will need insurance approval and specialty pharmacy involvement.  - Pulmonary hypertension serologic workup: ANA, RF, anti-SCL70, anti-centromere, ANCA all negative.  5. AKI on CKD stage 3: Creatinine stable at 1.3.  Follow closely.  6. Mass in left axilla: She will need followup with her oncologist after discharge.   I think she could go to SNF today.  Will need CHF clinic followup 10-14 days, BMET 1 week.  Cardiac meds for discharge: Torsemide 60 mg daily, KCl 20 daily, amiodarone 100 daily, ASA 81 daily, atorvastatin 20 daily, midodrine 5 tid, spironolactone 12.5 daily.   Loralie Champagne 11/08/2018 8:34 AM

## 2018-11-08 NOTE — Progress Notes (Signed)
Physical Therapy Treatment Patient Details Name: Tammy Mathews MRN: [id number] DOB: 1951/05/02 Today's Date: 11/08/2018    History of Present Illness Tammy Mathews is a 68 y.o. female with medical history significant of breast cancer in remission, morbid obesity, hypertension, hyperlipidemia, type 2 diabetes, COPD and chronic hypoxic respiratory failure on 3 L home oxygen presented to [facility] with complains of left-sided chest pain, progressive shortness of breath, orthopnea, 50 pound weight gain over the last several months, and bilateral lower extremity edema.    PT Comments    Pt admitted with above diagnosis. Pt currently with functional limitations due to balance and endurance deficits. Pt was able to ambulate with rollator in hallway with incr distance and incr steadiness each attempt.  Will continue to follow acutely while in hospital.   Pt will benefit from skilled PT to increase their independence and safety with mobility to allow discharge to the venue listed below.     Follow Up Recommendations  SNF;Supervision/Assistance - 24 hour     Equipment Recommendations  None recommended by PT    Recommendations for Other Services       Precautions / Restrictions Precautions Precautions: Fall Precaution Comments: morbidly obese Restrictions Weight Bearing Restrictions: No    Mobility  Bed Mobility Overal bed mobility: Needs Assistance Bed Mobility: Supine to Sit     Supine to sit: Min guard;HOB elevated Sit to supine: Min assist   General bed mobility comments: min A for management of LE into bed  Transfers Overall transfer level: Needs assistance Equipment used: 2 person hand held assist Transfers: Sit to/from [facility] Sit to Stand: +2 safety/equipment;Min assist;Min guard         General transfer comment: Pt only needed a little assist initially nedding min assist to steady.  Stepped to 3N1 and had BM.  Needed assist to be cleaned.     Ambulation/Gait Ambulation/Gait assistance: Min assist;Min guard;+2 safety/equipment Gait Distance (Feet): 150 Feet(75 feet x 2) Assistive device: 4-wheeled walker Gait Pattern/deviations: Shuffle;Trunk flexed;Wide base of support;Drifts right/left;Antalgic;Decreased stride length;Step-through pattern   Gait velocity interpretation: <1.31 ft/sec, indicative of household ambulator General Gait Details: Pt was able to ambulate to hallway with chair follow with one seated rest break.  Fairly steady with rollator.     Stairs             Wheelchair Mobility    Modified Rankin (Stroke Patients Only)       Balance Overall balance assessment: Needs assistance Sitting-balance support: Feet supported;Bilateral upper extremity supported Sitting balance-Leahy Scale: Fair     Standing balance support: Bilateral upper extremity supported Standing balance-Leahy Scale: Poor Standing balance comment: requires bilateral UE support                            Cognition Arousal/Alertness: Awake/alert Behavior During Therapy: WFL for tasks assessed/performed Overall Cognitive Status: Within Functional Limits for tasks assessed                                        Exercises General Exercises - Lower Extremity Ankle Circles/Pumps: AROM;Both;10 reps;Supine Long Arc Quad: AROM;Both;10 reps;Seated Hip Flexion/Marching: AROM;Both;10 reps;Seated    General Comments General comments (skin integrity, edema, etc.): VSS, sats on RA at end of session 92% on RA      Pertinent Vitals/Pain Pain Assessment: Faces Faces Pain Scale: Hurts little  more Pain Location: bilateral feet with weight bearing Pain Descriptors / Indicators: Aching;Sore(arthritic pain) Pain Intervention(s): Limited activity within patient's tolerance;Monitored during session;Repositioned    Home Living                      Prior Function            PT Goals (current goals can  now be found in the care plan section) Acute Rehab PT Goals Patient Stated Goal: get back to walking Progress towards PT goals: Progressing toward goals    Frequency    Min 3X/week      PT Plan Current plan remains appropriate    Co-evaluation              AM-PAC PT "6 Clicks" Mobility   Outcome Measure  Help needed turning from your back to your side while in a flat bed without using bedrails?: A Little Help needed moving from lying on your back to sitting on the side of a flat bed without using bedrails?: A Little Help needed moving to and from a bed to a chair (including a wheelchair)?: A Little Help needed standing up from a chair using your arms (e.g., wheelchair or bedside chair)?: A Little Help needed to walk in hospital room?: A Little Help needed climbing 3-5 steps with a railing? : A Lot 6 Click Score: 17    End of Session Equipment Utilized During Treatment: Gait belt Activity Tolerance: Patient tolerated treatment well Patient left: with call bell/phone within reach;in bed Nurse Communication: Mobility status PT Visit Diagnosis: Unsteadiness on feet (R26.81);Difficulty in walking, not elsewhere classified (R26.2)     Time: 4650-3546 PT Time Calculation (min) (ACUTE ONLY): 26 min  Charges:  $Gait Training: 8-22 mins $Therapeutic Exercise: 8-22 mins                     [place] Pager:  [phone]  Office:  [place] 11/08/2018, 3:29 PM

## 2018-11-08 NOTE — Progress Notes (Signed)
Discharged to  [facility] by [facility] ambulance. Transfer paper and discharge instructions given to ambulance staff. Belongings taken home.

## 2018-11-10 DIAGNOSIS — E782 Mixed hyperlipidemia: Secondary | ICD-10-CM | POA: Diagnosis not present

## 2018-11-10 DIAGNOSIS — E875 Hyperkalemia: Secondary | ICD-10-CM | POA: Diagnosis not present

## 2018-11-10 DIAGNOSIS — I5032 Chronic diastolic (congestive) heart failure: Secondary | ICD-10-CM | POA: Diagnosis not present

## 2018-11-10 DIAGNOSIS — J9611 Chronic respiratory failure with hypoxia: Secondary | ICD-10-CM | POA: Diagnosis not present

## 2018-11-11 DIAGNOSIS — J9612 Chronic respiratory failure with hypercapnia: Secondary | ICD-10-CM | POA: Diagnosis not present

## 2018-11-11 DIAGNOSIS — I110 Hypertensive heart disease with heart failure: Secondary | ICD-10-CM | POA: Diagnosis not present

## 2018-11-11 DIAGNOSIS — M79671 Pain in right foot: Secondary | ICD-10-CM | POA: Diagnosis not present

## 2018-11-11 DIAGNOSIS — J9611 Chronic respiratory failure with hypoxia: Secondary | ICD-10-CM | POA: Diagnosis not present

## 2018-11-11 DIAGNOSIS — I5032 Chronic diastolic (congestive) heart failure: Secondary | ICD-10-CM | POA: Diagnosis not present

## 2018-11-12 ENCOUNTER — Telehealth (HOSPITAL_COMMUNITY): Payer: Self-pay

## 2018-11-12 DIAGNOSIS — I5032 Chronic diastolic (congestive) heart failure: Secondary | ICD-10-CM

## 2018-11-12 NOTE — Telephone Encounter (Signed)
-----  Message from Larey Dresser, MD sent at 11/08/2018  8:48 AM EDT ----- Please arrange followup with me in office 10-14 days.  Needs BMET 1 week, can be drawn at her rehab facility.

## 2018-11-12 NOTE — Telephone Encounter (Signed)
Per Dr Aundra Dubin, pt needs BMET in 1 week. [place] [facility] and spoke with Levada Dy, ok to send script to have labs done in 1 week. Order faxed to [phone].  Message sent to [facility] to schedule patient for f/u.

## 2018-11-25 ENCOUNTER — Other Ambulatory Visit: Payer: Self-pay

## 2018-11-25 NOTE — Patient Outreach (Signed)
[facility]) Care Management  11/25/2018  Cinda Hara 09/02/1950 [id number]   Medication Adherence call to Mrs. Lillie Columbia spoke with patient she did not want to engage patient is showing past due on Atorvastatin 20 mg under [place].   [place] Management Direct Dial [phone]  Fax [phone] [email or web address]

## 2018-11-27 NOTE — Progress Notes (Signed)
PCP: Dr Burnett Sheng Primary Cardiologist: Dr Bettina Gavia   HPI: Ms Tammy Mathews is a 68 year old with history of breast cancer, obesity, HTN, hyperlipidemia, COPD, never smoked, chronic respiratory failure on 3 liters oxygen, and  CKD.   Admitted from  [facility] with worsening renal function and increased shortness of breath. She had a 50 pound weight 4 weeks prior to admit. At [facility] she was placed on IV diuretic with poor response and renal function worsened. Admitted by [facility] on 6/21. HF team consulted. Diuresed with IV lasix+ diamox  and later had RHC. Discharged to SNF. Discharge weight was 242 pounds.   Today she returns for post hospital follow up. Overall feeling fine. She remains SOB with  exertion. She spends most of her time staying in the bed. Dizzy when she stands up.  Denies PND/Orthopnea. Remains on 2  Liters. Appetite ok. No fever or chills. Having difficulty standing due to right foot and has not been able to weigh. Taking all medications provided at [facility].   ECHO 11/2018 preserved LVEF, RV moderately reduced, severely elevated RVSP 85 mmHg, D-shaped septum, and moderate TR.    RHC Procedural Findings (11/06/18): Hemodynamics (mmHg) RA mean 14 RV 77/16 PA 80/34, mean 53 PCWP mean 21 Oxygen saturations: PA 68% AO 96% Cardiac Output (Fick) 7.96  Cardiac Index (Fick) 3.8 PVR 4 WU  ROS: All systems negative except as listed in HPI, PMH and Problem List.  SH:  Social History   Socioeconomic History  . Marital status: Single    Spouse name: Not on file  . Number of children: Not on file  . Years of education: Not on file  . Highest education level: Not on file  Occupational History  . Not on file  Social Needs  . Financial resource strain: Not on file  . Food insecurity    Worry: Not on file    Inability: Not on file  . Transportation needs    Medical: Not on file    Non-medical: Not on file  Tobacco Use  . Smoking status: Never Smoker  . Smokeless  tobacco: Never Used  Substance and Sexual Activity  . Alcohol use: Never    Frequency: Never  . Drug use: Never  . Sexual activity: Not on file  Lifestyle  . Physical activity    Days per week: Not on file    Minutes per session: Not on file  . Stress: Not on file  Relationships  . Social Herbalist on phone: Not on file    Gets together: Not on file    Attends religious service: Not on file    Active member of club or organization: Not on file    Attends meetings of clubs or organizations: Not on file    Relationship status: Not on file  . Intimate partner violence    Fear of current or ex partner: Not on file    Emotionally abused: Not on file    Physically abused: Not on file    Forced sexual activity: Not on file  Other Topics Concern  . Not on file  Social History Narrative  . Not on file    FH:  Family History  Problem Relation Age of Onset  . Heart disease Mother   . Colon cancer Mother   . Breast cancer Mother   . Emphysema Father        smoked  . Emphysema Brother  smoked    Past Medical History:  Diagnosis Date  . Breast cancer ([place])   . COPD (chronic obstructive pulmonary disease) ([place])   . Diabetes mellitus type 2 in obese ([place])   . HLD (hyperlipidemia)   . HTN (hypertension)   . Morbid obesity ([place])     Current Outpatient Medications  Medication Sig Dispense Refill  . ACCU-CHEK AVIVA PLUS test strip     . acetaminophen (TYLENOL) 500 MG tablet Take 1,000 mg by mouth every 6 (six) hours as needed for mild pain.    [name] Kitchen albuterol (PROVENTIL HFA;VENTOLIN HFA) 108 (90 Base) MCG/ACT inhaler Inhale 2 puffs into the lungs every 4 (four) hours as needed for wheezing or shortness of breath.     [name] Kitchen amiodarone (PACERONE) 100 MG tablet Take 1 tablet (100 mg total) by mouth daily. 30 tablet 0  . anastrozole (ARIMIDEX) 1 MG tablet Take 1 mg by mouth daily.    [name] Kitchen aspirin EC 81 MG tablet Take 81 mg by mouth daily.    . budesonide (PULMICORT) 0.5  MG/2ML nebulizer solution Inhale 2 mLs into the lungs every 6 (six) hours as needed for shortness of breath.    . budesonide-formoterol (SYMBICORT) 80-4.5 MCG/ACT inhaler Take 2 puffs first thing in am and then another 2 puffs about 12 hours later. 1 Inhaler 12  . insulin aspart (NOVOLOG) 100 UNIT/ML injection Sliding scale for glucose:  CBG < 70: Implement Hypoglycemia Protocol  CBG 70 - 120: 0 units  CBG 121 - 150: 1 unit  CBG 151 - 200: 2 units  CBG 201 - 250: 3 units  CBG 251 - 300: 5 units  CBG 301 - 350: 7 units  CBG 351 - 400 9 units  CBG > 400 Notify facility MD 10 mL 0  . Insulin Glargine (LANTUS SOLOSTAR) 100 UNIT/ML Solostar Pen Inject 8 Units into the skin at bedtime.    [name] Kitchen ipratropium-albuterol (DUONEB) 0.5-2.5 (3) MG/3ML SOLN Take 3 mLs by nebulization every 4 (four) hours as needed.    . loperamide (IMODIUM A-D) 2 MG tablet Take 2 mg by mouth 4 (four) times daily as needed for diarrhea or loose stools.    [name] Kitchen loratadine (CLARITIN) 10 MG tablet Take 10 mg by mouth daily as needed for allergies.    [name] Kitchen meclizine (ANTIVERT) 25 MG tablet Take 25 mg by mouth every 6 (six) hours as needed for dizziness.     . midodrine (PROAMATINE) 5 MG tablet Take 1 tablet (5 mg total) by mouth 3 (three) times daily with meals. 90 tablet 0  . OXYGEN 3 L continuous. AHC    . potassium chloride SA (K-DUR) 20 MEQ tablet Take 1 tablet (20 mEq total) by mouth 2 (two) times daily. 60 tablet 0  . spironolactone (ALDACTONE) 25 MG tablet Take 0.5 tablets (12.5 mg total) by mouth daily. 15 tablet 0  . torsemide (DEMADEX) 20 MG tablet Take 3 tablets (60 mg total) by mouth daily. 90 tablet 0   No current facility-administered medications for this encounter.     Vitals:   11/28/18 1244  BP: 122/84  Pulse: 99  SpO2: 96%   Wt Readings from Last 3 Encounters:  11/08/18 110.1 kg (242 lb 11.6 oz)  02/11/18 120.2 kg (265 lb)  08/31/17 129.7 kg (286 lb)    PHYSICAL EXAM:  General:  Appears chronically ill.  On 2 liters oxygen.  HEENT: normal Neck: supple. JVP flat. Carotids 2+ bilaterally; no bruits. No lymphadenopathy or thryomegaly appreciated. Cor:  PMI normal. Regular rate & rhythm. No rubs, gallops or murmurs. R upper chest porta cath.  Lungs: clear on 2 liters.  Abdomen: soft, nontender, nondistended. No hepatosplenomegaly. No bruits or masses. Good bowel sounds. Extremities: no cyanosis, clubbing, rash, [place]. R and LLE chronic hyperpigmentation.  Neuro: alert & orientedx3, cranial nerves grossly intact. Moves all 4 extremities w/o difficulty. Affect pleasant.      ASSESSMENT & PLAN: 1. Chronic hypoxemic respiratory failure: She has been labeled as having COPD but never smoked. Based on body habitus, OHS/OSA is a possibility. She is on 2 liters continuously. She is unable to tolerate CPAP. O2 sats stable.  2. Chronic diastolic CHF with prominent RV failure: Echo (6/20) with EF 60-65%, severely dilated RV with moderately decreased RV systolic function, moderate TR, PASP 85 mmHg. She was markedly volume overloaded on exam initially and has diuresed well.  [place] 7/1 showed severe pulmonary artery hypertension with ongoing volume overload.  - NYHA IIIb. Volume seems low and with dizziness we will cut back torsemide to 40 mg daily.   -Continue Midodrine 2.5 mg three times a day. 3. Atrial fibrillation: Paroxysmal. Apparently she was on anticoagulation (not sure what) a while ago and had severe epistaxis. She stopped anticoagulation and does not want to restart. She is on ASA currently. Regular on exam.  - amiodarone to 100 mg daily. CT chest is not suggestive of amiodarone lung toxicity.  4. Pulmonary hypertension: Severe pulmonary hypertension by echo with RV dysfunction. Etiology uncertain. She has chronic hypoxemic respiratory failure. This certainly could be due to long-standing pulmonary hypertension, but also possibly due to OSH/OSA. She has never smoked so COPD unlikely. High  resolution CT chest did not show evidence for ILD, there was bibasilar atelectasis.  V/Q scan with perfusion defect likely due to atelectasis. Discussed with radiology, unlikely PE and do not recommend further workup. Serologic workup for autoimmune causes of pulmonary hypertension was negative.  RHC shows severe pulmonary arterial hypertension.  -  May be a  candidate for a trial of pulmonary vasodilators.  Will arrange for this as outpatient as will need insurance approval and specialty pharmacy involvement.  - Pulmonary hypertension serologic workup: ANA, RF, anti-SCL70, anti-centromere, ANCA all negative.  5. CKD stage 3: Check BMET today.  6. Mass in left axilla: She will need followup with her oncologist after discharge.    Follow up in 3-4 weeks with Dr Aundra Dubin.   Ashad Fawbush NP-C   1:21 PM

## 2018-11-28 ENCOUNTER — Other Ambulatory Visit: Payer: Self-pay

## 2018-11-28 ENCOUNTER — Ambulatory Visit (HOSPITAL_COMMUNITY)
Admission: RE | Admit: 2018-11-28 | Discharge: 2018-11-28 | Disposition: A | Discharge status: Home / Self Care | Payer: Medicare Other | Source: Ambulatory Visit | Attending: Internal Medicine | Admitting: Internal Medicine

## 2018-11-28 VITALS — BP 122/84 | HR 99 | Wt 217.6 lb

## 2018-11-28 DIAGNOSIS — Z9981 Dependence on supplemental oxygen: Secondary | ICD-10-CM | POA: Insufficient documentation

## 2018-11-28 DIAGNOSIS — Z7982 Long term (current) use of aspirin: Secondary | ICD-10-CM | POA: Insufficient documentation

## 2018-11-28 DIAGNOSIS — Z79899 Other long term (current) drug therapy: Secondary | ICD-10-CM | POA: Insufficient documentation

## 2018-11-28 DIAGNOSIS — E1122 Type 2 diabetes mellitus with diabetic chronic kidney disease: Secondary | ICD-10-CM | POA: Diagnosis not present

## 2018-11-28 DIAGNOSIS — N183 Chronic kidney disease, stage 3 unspecified: Secondary | ICD-10-CM

## 2018-11-28 DIAGNOSIS — I2721 Secondary pulmonary arterial hypertension: Secondary | ICD-10-CM

## 2018-11-28 DIAGNOSIS — J9611 Chronic respiratory failure with hypoxia: Secondary | ICD-10-CM | POA: Diagnosis not present

## 2018-11-28 DIAGNOSIS — I480 Paroxysmal atrial fibrillation: Secondary | ICD-10-CM | POA: Insufficient documentation

## 2018-11-28 DIAGNOSIS — I130 Hypertensive heart and chronic kidney disease with heart failure and stage 1 through stage 4 chronic kidney disease, or unspecified chronic kidney disease: Secondary | ICD-10-CM | POA: Diagnosis not present

## 2018-11-28 DIAGNOSIS — Z853 Personal history of malignant neoplasm of breast: Secondary | ICD-10-CM | POA: Diagnosis not present

## 2018-11-28 DIAGNOSIS — K766 Portal hypertension: Secondary | ICD-10-CM | POA: Diagnosis not present

## 2018-11-28 DIAGNOSIS — Z803 Family history of malignant neoplasm of breast: Secondary | ICD-10-CM | POA: Insufficient documentation

## 2018-11-28 DIAGNOSIS — Z794 Long term (current) use of insulin: Secondary | ICD-10-CM | POA: Insufficient documentation

## 2018-11-28 DIAGNOSIS — Z7951 Long term (current) use of inhaled steroids: Secondary | ICD-10-CM | POA: Insufficient documentation

## 2018-11-28 DIAGNOSIS — J449 Chronic obstructive pulmonary disease, unspecified: Secondary | ICD-10-CM | POA: Diagnosis not present

## 2018-11-28 DIAGNOSIS — I5189 Other ill-defined heart diseases: Secondary | ICD-10-CM | POA: Diagnosis not present

## 2018-11-28 DIAGNOSIS — I5032 Chronic diastolic (congestive) heart failure: Secondary | ICD-10-CM | POA: Diagnosis not present

## 2018-11-28 DIAGNOSIS — Z8249 Family history of ischemic heart disease and other diseases of the circulatory system: Secondary | ICD-10-CM | POA: Diagnosis not present

## 2018-11-28 DIAGNOSIS — E785 Hyperlipidemia, unspecified: Secondary | ICD-10-CM | POA: Insufficient documentation

## 2018-11-28 DIAGNOSIS — R222 Localized swelling, mass and lump, trunk: Secondary | ICD-10-CM | POA: Insufficient documentation

## 2018-11-28 LAB — BASIC METABOLIC PANEL
Anion gap: 12 (ref 5–15)
BUN: 51 mg/dL — ABNORMAL HIGH (ref 8–23)
CO2: 29 mmol/L (ref 22–32)
Calcium: 9.5 mg/dL (ref 8.9–10.3)
Chloride: 91 mmol/L — ABNORMAL LOW (ref 98–111)
Creatinine, Ser: 1.65 mg/dL — ABNORMAL HIGH (ref 0.44–1.00)
GFR calc Af Amer: 36 mL/min — ABNORMAL LOW (ref >60)
GFR calc non Af Amer: 31 mL/min — ABNORMAL LOW (ref >60)
Glucose, Bld: 300 mg/dL — ABNORMAL HIGH (ref 70–99)
Potassium: 4.8 mmol/L (ref 3.5–5.1)
Sodium: 132 mmol/L — ABNORMAL LOW (ref 135–145)

## 2018-11-28 MED ORDER — MIDODRINE HCL 2.5 MG PO TABS: 2.5000 mg | ORAL_TABLET | Freq: Three times a day (TID) | ORAL | 6 refills | Status: AC

## 2018-11-28 MED ORDER — TORSEMIDE 20 MG PO TABS: 40.0000 mg | ORAL_TABLET | Freq: Every day | ORAL | 6 refills | Status: DC

## 2018-11-28 NOTE — Patient Instructions (Signed)
Labs today We will only contact you if something comes back abnormal or we need to make some changes. Otherwise no news is good news!  DECREASE Midodrine to 2.19m (1 tab) thre e times a day  DECREASE Torsemide to 40 mg (2 tabs) daily  Your physician recommends that you schedule a follow-up appointment in: 3-4 weeks with Dr MAundra Dubin  [facility] you and your health needs are our priority. As part of our continuing mission to provide you with exceptional heart care, we have created designated Provider Care Teams. These Care Teams include your primary Cardiologist (physician) and Advanced Practice Providers (APPs- Physician Assistants and Nurse Practitioners) who all work together to provide you with the care you need, when you need it.   You may see any of the following providers on your designated Care Team at your next follow up: .[name] KitchenDr DGlori Bickers. Dr DLoralie Champagne. ADarrick Grinder NP

## 2018-12-03 ENCOUNTER — Telehealth (HOSPITAL_COMMUNITY): Payer: Self-pay

## 2018-12-03 DIAGNOSIS — R52 Pain, unspecified: Secondary | ICD-10-CM | POA: Diagnosis not present

## 2018-12-03 DIAGNOSIS — M6281 Muscle weakness (generalized): Secondary | ICD-10-CM | POA: Diagnosis not present

## 2018-12-03 DIAGNOSIS — R262 Difficulty in walking, not elsewhere classified: Secondary | ICD-10-CM | POA: Diagnosis not present

## 2018-12-03 NOTE — Telephone Encounter (Signed)
Called snf. They will draw bmet and fax over on 12/04/18

## 2018-12-03 NOTE — Telephone Encounter (Signed)
-----  Message from Conrad [place], NP sent at 11/29/2018  1:23 PM EDT ----- Diuretics cut back today. Needs BMET next week at that SNF. Please call SNF.

## 2018-12-04 DIAGNOSIS — Z03818 Encounter for observation for suspected exposure to other biological agents ruled out: Secondary | ICD-10-CM | POA: Diagnosis not present

## 2018-12-04 DIAGNOSIS — R262 Difficulty in walking, not elsewhere classified: Secondary | ICD-10-CM | POA: Diagnosis not present

## 2018-12-04 DIAGNOSIS — R52 Pain, unspecified: Secondary | ICD-10-CM | POA: Diagnosis not present

## 2018-12-04 DIAGNOSIS — E8770 Fluid overload, unspecified: Secondary | ICD-10-CM | POA: Diagnosis not present

## 2018-12-04 DIAGNOSIS — E119 Type 2 diabetes mellitus without complications: Secondary | ICD-10-CM | POA: Diagnosis not present

## 2018-12-04 DIAGNOSIS — D649 Anemia, unspecified: Secondary | ICD-10-CM | POA: Diagnosis not present

## 2018-12-04 DIAGNOSIS — M6281 Muscle weakness (generalized): Secondary | ICD-10-CM | POA: Diagnosis not present

## 2018-12-04 DIAGNOSIS — I509 Heart failure, unspecified: Secondary | ICD-10-CM | POA: Diagnosis not present

## 2018-12-05 ENCOUNTER — Telehealth (HOSPITAL_COMMUNITY): Payer: Self-pay | Admitting: Surgery

## 2018-12-05 DIAGNOSIS — R52 Pain, unspecified: Secondary | ICD-10-CM | POA: Diagnosis not present

## 2018-12-05 DIAGNOSIS — I110 Hypertensive heart disease with heart failure: Secondary | ICD-10-CM | POA: Diagnosis not present

## 2018-12-05 DIAGNOSIS — N170 Acute kidney failure with tubular necrosis: Secondary | ICD-10-CM | POA: Diagnosis not present

## 2018-12-05 DIAGNOSIS — M6281 Muscle weakness (generalized): Secondary | ICD-10-CM | POA: Diagnosis not present

## 2018-12-05 DIAGNOSIS — I5032 Chronic diastolic (congestive) heart failure: Secondary | ICD-10-CM | POA: Diagnosis not present

## 2018-12-05 DIAGNOSIS — E875 Hyperkalemia: Secondary | ICD-10-CM | POA: Diagnosis not present

## 2018-12-05 DIAGNOSIS — R262 Difficulty in walking, not elsewhere classified: Secondary | ICD-10-CM | POA: Diagnosis not present

## 2018-12-05 MED ORDER — TORSEMIDE 20 MG PO TABS: 20.0000 mg | ORAL_TABLET | Freq: Every day | ORAL | 6 refills | Status: AC

## 2018-12-05 NOTE — Telephone Encounter (Signed)
Attempted to reach patient again regarding blood work and med recommendations by NP. Lm to return call to office.

## 2018-12-05 NOTE — Addendum Note (Signed)
Addended by: Kerry Dory on: 12/05/2018 05:06 PM   Modules accepted: Orders

## 2018-12-05 NOTE — Telephone Encounter (Signed)
Attempted to reach patient in reference to recent blood work and instructions for medications.  I left a message for a return call.

## 2018-12-05 NOTE — Telephone Encounter (Signed)
Patient is currently in [place] [facility] [place] given to [place] faxed to [phone] Attn-Cindy  Abnormal labs received, labs drawn 12/04/18 K 5.6 Cr 2.69 BUN 79 Na 137  Per Amy Clegg,NP Stop potassium, hold torsemide x 2days, then resume 20 mg daily thereafter Repeat bmet in 7 days

## 2018-12-06 ENCOUNTER — Other Ambulatory Visit (HOSPITAL_COMMUNITY): Payer: Self-pay | Admitting: Cardiology

## 2018-12-06 DIAGNOSIS — R262 Difficulty in walking, not elsewhere classified: Secondary | ICD-10-CM | POA: Diagnosis not present

## 2018-12-06 DIAGNOSIS — R52 Pain, unspecified: Secondary | ICD-10-CM | POA: Diagnosis not present

## 2018-12-06 DIAGNOSIS — M6281 Muscle weakness (generalized): Secondary | ICD-10-CM | POA: Diagnosis not present

## 2018-12-09 DIAGNOSIS — R262 Difficulty in walking, not elsewhere classified: Secondary | ICD-10-CM | POA: Diagnosis not present

## 2018-12-09 DIAGNOSIS — R278 Other lack of coordination: Secondary | ICD-10-CM | POA: Diagnosis not present

## 2018-12-09 DIAGNOSIS — J9690 Respiratory failure, unspecified, unspecified whether with hypoxia or hypercapnia: Secondary | ICD-10-CM | POA: Diagnosis not present

## 2018-12-09 DIAGNOSIS — A4189 Other specified sepsis: Secondary | ICD-10-CM | POA: Diagnosis not present

## 2018-12-09 DIAGNOSIS — Z03818 Encounter for observation for suspected exposure to other biological agents ruled out: Secondary | ICD-10-CM | POA: Diagnosis not present

## 2018-12-09 DIAGNOSIS — R296 Repeated falls: Secondary | ICD-10-CM | POA: Diagnosis not present

## 2018-12-09 DIAGNOSIS — R062 Wheezing: Secondary | ICD-10-CM | POA: Diagnosis not present

## 2018-12-09 DIAGNOSIS — M6281 Muscle weakness (generalized): Secondary | ICD-10-CM | POA: Diagnosis not present

## 2018-12-10 DIAGNOSIS — M6281 Muscle weakness (generalized): Secondary | ICD-10-CM | POA: Diagnosis not present

## 2018-12-10 DIAGNOSIS — R262 Difficulty in walking, not elsewhere classified: Secondary | ICD-10-CM | POA: Diagnosis not present

## 2018-12-10 DIAGNOSIS — R278 Other lack of coordination: Secondary | ICD-10-CM | POA: Diagnosis not present

## 2018-12-10 DIAGNOSIS — R296 Repeated falls: Secondary | ICD-10-CM | POA: Diagnosis not present

## 2018-12-11 DIAGNOSIS — R296 Repeated falls: Secondary | ICD-10-CM | POA: Diagnosis not present

## 2018-12-11 DIAGNOSIS — R262 Difficulty in walking, not elsewhere classified: Secondary | ICD-10-CM | POA: Diagnosis not present

## 2018-12-11 DIAGNOSIS — M6281 Muscle weakness (generalized): Secondary | ICD-10-CM | POA: Diagnosis not present

## 2018-12-11 DIAGNOSIS — R278 Other lack of coordination: Secondary | ICD-10-CM | POA: Diagnosis not present

## 2018-12-12 DIAGNOSIS — E119 Type 2 diabetes mellitus without complications: Secondary | ICD-10-CM | POA: Diagnosis not present

## 2018-12-12 DIAGNOSIS — D649 Anemia, unspecified: Secondary | ICD-10-CM | POA: Diagnosis not present

## 2018-12-12 DIAGNOSIS — M6281 Muscle weakness (generalized): Secondary | ICD-10-CM | POA: Diagnosis not present

## 2018-12-12 DIAGNOSIS — R278 Other lack of coordination: Secondary | ICD-10-CM | POA: Diagnosis not present

## 2018-12-12 DIAGNOSIS — R296 Repeated falls: Secondary | ICD-10-CM | POA: Diagnosis not present

## 2018-12-12 DIAGNOSIS — I509 Heart failure, unspecified: Secondary | ICD-10-CM | POA: Diagnosis not present

## 2018-12-12 DIAGNOSIS — R262 Difficulty in walking, not elsewhere classified: Secondary | ICD-10-CM | POA: Diagnosis not present

## 2018-12-12 DIAGNOSIS — E8770 Fluid overload, unspecified: Secondary | ICD-10-CM | POA: Diagnosis not present

## 2018-12-13 DIAGNOSIS — L600 Ingrowing nail: Secondary | ICD-10-CM | POA: Diagnosis not present

## 2018-12-13 DIAGNOSIS — R278 Other lack of coordination: Secondary | ICD-10-CM | POA: Diagnosis not present

## 2018-12-13 DIAGNOSIS — R262 Difficulty in walking, not elsewhere classified: Secondary | ICD-10-CM | POA: Diagnosis not present

## 2018-12-13 DIAGNOSIS — E1151 Type 2 diabetes mellitus with diabetic peripheral angiopathy without gangrene: Secondary | ICD-10-CM | POA: Diagnosis not present

## 2018-12-13 DIAGNOSIS — M6281 Muscle weakness (generalized): Secondary | ICD-10-CM | POA: Diagnosis not present

## 2018-12-13 DIAGNOSIS — L84 Corns and callosities: Secondary | ICD-10-CM | POA: Diagnosis not present

## 2018-12-13 DIAGNOSIS — B351 Tinea unguium: Secondary | ICD-10-CM | POA: Diagnosis not present

## 2018-12-13 DIAGNOSIS — R296 Repeated falls: Secondary | ICD-10-CM | POA: Diagnosis not present

## 2018-12-13 DIAGNOSIS — L603 Nail dystrophy: Secondary | ICD-10-CM | POA: Diagnosis not present

## 2018-12-16 DIAGNOSIS — Z03818 Encounter for observation for suspected exposure to other biological agents ruled out: Secondary | ICD-10-CM | POA: Diagnosis not present

## 2018-12-16 DIAGNOSIS — M6281 Muscle weakness (generalized): Secondary | ICD-10-CM | POA: Diagnosis not present

## 2018-12-16 DIAGNOSIS — R262 Difficulty in walking, not elsewhere classified: Secondary | ICD-10-CM | POA: Diagnosis not present

## 2018-12-16 DIAGNOSIS — R296 Repeated falls: Secondary | ICD-10-CM | POA: Diagnosis not present

## 2018-12-16 DIAGNOSIS — R278 Other lack of coordination: Secondary | ICD-10-CM | POA: Diagnosis not present

## 2018-12-17 ENCOUNTER — Other Ambulatory Visit: Payer: Self-pay

## 2018-12-17 DIAGNOSIS — R296 Repeated falls: Secondary | ICD-10-CM | POA: Diagnosis not present

## 2018-12-17 DIAGNOSIS — R278 Other lack of coordination: Secondary | ICD-10-CM | POA: Diagnosis not present

## 2018-12-17 DIAGNOSIS — M6281 Muscle weakness (generalized): Secondary | ICD-10-CM | POA: Diagnosis not present

## 2018-12-17 DIAGNOSIS — R262 Difficulty in walking, not elsewhere classified: Secondary | ICD-10-CM | POA: Diagnosis not present

## 2018-12-17 NOTE — Patient Outreach (Signed)
[facility]) Care Management  12/17/2018  Leilene Diprima 09-02-1950 [id number]   Medication Adherence call to Mrs. [place] Compliant Voice message left with a call back number. Mrs. Lauture is showing past due on Atorvastatin 20 mg under [place].   [facility] Management Direct Dial [phone]  Fax [phone] [email or web address]

## 2018-12-18 DIAGNOSIS — M6281 Muscle weakness (generalized): Secondary | ICD-10-CM | POA: Diagnosis not present

## 2018-12-18 DIAGNOSIS — R278 Other lack of coordination: Secondary | ICD-10-CM | POA: Diagnosis not present

## 2018-12-18 DIAGNOSIS — R262 Difficulty in walking, not elsewhere classified: Secondary | ICD-10-CM | POA: Diagnosis not present

## 2018-12-18 DIAGNOSIS — R296 Repeated falls: Secondary | ICD-10-CM | POA: Diagnosis not present

## 2018-12-19 DIAGNOSIS — R262 Difficulty in walking, not elsewhere classified: Secondary | ICD-10-CM | POA: Diagnosis not present

## 2018-12-19 DIAGNOSIS — M6281 Muscle weakness (generalized): Secondary | ICD-10-CM | POA: Diagnosis not present

## 2018-12-19 DIAGNOSIS — R278 Other lack of coordination: Secondary | ICD-10-CM | POA: Diagnosis not present

## 2018-12-19 DIAGNOSIS — R296 Repeated falls: Secondary | ICD-10-CM | POA: Diagnosis not present

## 2018-12-20 DIAGNOSIS — R278 Other lack of coordination: Secondary | ICD-10-CM | POA: Diagnosis not present

## 2018-12-20 DIAGNOSIS — M6281 Muscle weakness (generalized): Secondary | ICD-10-CM | POA: Diagnosis not present

## 2018-12-20 DIAGNOSIS — R296 Repeated falls: Secondary | ICD-10-CM | POA: Diagnosis not present

## 2018-12-20 DIAGNOSIS — R262 Difficulty in walking, not elsewhere classified: Secondary | ICD-10-CM | POA: Diagnosis not present

## 2018-12-23 DIAGNOSIS — R296 Repeated falls: Secondary | ICD-10-CM | POA: Diagnosis not present

## 2018-12-23 DIAGNOSIS — Z1159 Encounter for screening for other viral diseases: Secondary | ICD-10-CM | POA: Diagnosis not present

## 2018-12-23 DIAGNOSIS — R278 Other lack of coordination: Secondary | ICD-10-CM | POA: Diagnosis not present

## 2018-12-23 DIAGNOSIS — R262 Difficulty in walking, not elsewhere classified: Secondary | ICD-10-CM | POA: Diagnosis not present

## 2018-12-23 DIAGNOSIS — M6281 Muscle weakness (generalized): Secondary | ICD-10-CM | POA: Diagnosis not present

## 2018-12-24 DIAGNOSIS — R262 Difficulty in walking, not elsewhere classified: Secondary | ICD-10-CM | POA: Diagnosis not present

## 2018-12-24 DIAGNOSIS — R278 Other lack of coordination: Secondary | ICD-10-CM | POA: Diagnosis not present

## 2018-12-24 DIAGNOSIS — R296 Repeated falls: Secondary | ICD-10-CM | POA: Diagnosis not present

## 2018-12-24 DIAGNOSIS — M6281 Muscle weakness (generalized): Secondary | ICD-10-CM | POA: Diagnosis not present

## 2018-12-25 DIAGNOSIS — M6281 Muscle weakness (generalized): Secondary | ICD-10-CM | POA: Diagnosis not present

## 2018-12-25 DIAGNOSIS — R262 Difficulty in walking, not elsewhere classified: Secondary | ICD-10-CM | POA: Diagnosis not present

## 2018-12-25 DIAGNOSIS — R296 Repeated falls: Secondary | ICD-10-CM | POA: Diagnosis not present

## 2018-12-25 DIAGNOSIS — R278 Other lack of coordination: Secondary | ICD-10-CM | POA: Diagnosis not present

## 2018-12-26 DIAGNOSIS — R296 Repeated falls: Secondary | ICD-10-CM | POA: Diagnosis not present

## 2018-12-26 DIAGNOSIS — R278 Other lack of coordination: Secondary | ICD-10-CM | POA: Diagnosis not present

## 2018-12-26 DIAGNOSIS — R262 Difficulty in walking, not elsewhere classified: Secondary | ICD-10-CM | POA: Diagnosis not present

## 2018-12-26 DIAGNOSIS — M6281 Muscle weakness (generalized): Secondary | ICD-10-CM | POA: Diagnosis not present

## 2018-12-27 DIAGNOSIS — M6281 Muscle weakness (generalized): Secondary | ICD-10-CM | POA: Diagnosis not present

## 2018-12-27 DIAGNOSIS — R278 Other lack of coordination: Secondary | ICD-10-CM | POA: Diagnosis not present

## 2018-12-27 DIAGNOSIS — R262 Difficulty in walking, not elsewhere classified: Secondary | ICD-10-CM | POA: Diagnosis not present

## 2018-12-27 DIAGNOSIS — R296 Repeated falls: Secondary | ICD-10-CM | POA: Diagnosis not present

## 2018-12-28 DIAGNOSIS — R278 Other lack of coordination: Secondary | ICD-10-CM | POA: Diagnosis not present

## 2018-12-28 DIAGNOSIS — M6281 Muscle weakness (generalized): Secondary | ICD-10-CM | POA: Diagnosis not present

## 2018-12-28 DIAGNOSIS — R262 Difficulty in walking, not elsewhere classified: Secondary | ICD-10-CM | POA: Diagnosis not present

## 2018-12-28 DIAGNOSIS — R296 Repeated falls: Secondary | ICD-10-CM | POA: Diagnosis not present

## 2018-12-29 DIAGNOSIS — M6281 Muscle weakness (generalized): Secondary | ICD-10-CM | POA: Diagnosis not present

## 2018-12-29 DIAGNOSIS — R262 Difficulty in walking, not elsewhere classified: Secondary | ICD-10-CM | POA: Diagnosis not present

## 2018-12-29 DIAGNOSIS — R278 Other lack of coordination: Secondary | ICD-10-CM | POA: Diagnosis not present

## 2018-12-29 DIAGNOSIS — R296 Repeated falls: Secondary | ICD-10-CM | POA: Diagnosis not present

## 2018-12-30 ENCOUNTER — Encounter (HOSPITAL_COMMUNITY): Payer: Self-pay | Admitting: Cardiology

## 2018-12-30 ENCOUNTER — Telehealth (HOSPITAL_COMMUNITY): Payer: Self-pay

## 2018-12-30 ENCOUNTER — Ambulatory Visit (HOSPITAL_COMMUNITY)
Admission: RE | Admit: 2018-12-30 | Discharge: 2018-12-30 | Disposition: A | Discharge status: Home / Self Care | Payer: Medicare Other | Source: Ambulatory Visit | Attending: Cardiology | Admitting: Cardiology

## 2018-12-30 ENCOUNTER — Other Ambulatory Visit: Payer: Self-pay

## 2018-12-30 VITALS — BP 118/72 | HR 87 | Wt 226.4 lb

## 2018-12-30 DIAGNOSIS — E785 Hyperlipidemia, unspecified: Secondary | ICD-10-CM | POA: Diagnosis not present

## 2018-12-30 DIAGNOSIS — Z03818 Encounter for observation for suspected exposure to other biological agents ruled out: Secondary | ICD-10-CM | POA: Diagnosis not present

## 2018-12-30 DIAGNOSIS — I480 Paroxysmal atrial fibrillation: Secondary | ICD-10-CM | POA: Insufficient documentation

## 2018-12-30 DIAGNOSIS — Z794 Long term (current) use of insulin: Secondary | ICD-10-CM | POA: Diagnosis not present

## 2018-12-30 DIAGNOSIS — Z8249 Family history of ischemic heart disease and other diseases of the circulatory system: Secondary | ICD-10-CM | POA: Insufficient documentation

## 2018-12-30 DIAGNOSIS — N183 Chronic kidney disease, stage 3 unspecified: Secondary | ICD-10-CM

## 2018-12-30 DIAGNOSIS — M6281 Muscle weakness (generalized): Secondary | ICD-10-CM | POA: Diagnosis not present

## 2018-12-30 DIAGNOSIS — Z800 Family history of malignant neoplasm of digestive organs: Secondary | ICD-10-CM | POA: Diagnosis not present

## 2018-12-30 DIAGNOSIS — Z853 Personal history of malignant neoplasm of breast: Secondary | ICD-10-CM | POA: Diagnosis not present

## 2018-12-30 DIAGNOSIS — Z6841 Body Mass Index (BMI) 40.0 and over, adult: Secondary | ICD-10-CM | POA: Diagnosis not present

## 2018-12-30 DIAGNOSIS — E1122 Type 2 diabetes mellitus with diabetic chronic kidney disease: Secondary | ICD-10-CM | POA: Diagnosis not present

## 2018-12-30 DIAGNOSIS — Z825 Family history of asthma and other chronic lower respiratory diseases: Secondary | ICD-10-CM | POA: Insufficient documentation

## 2018-12-30 DIAGNOSIS — R262 Difficulty in walking, not elsewhere classified: Secondary | ICD-10-CM | POA: Diagnosis not present

## 2018-12-30 DIAGNOSIS — I5032 Chronic diastolic (congestive) heart failure: Secondary | ICD-10-CM | POA: Insufficient documentation

## 2018-12-30 DIAGNOSIS — Z9981 Dependence on supplemental oxygen: Secondary | ICD-10-CM | POA: Diagnosis not present

## 2018-12-30 DIAGNOSIS — I2721 Secondary pulmonary arterial hypertension: Secondary | ICD-10-CM

## 2018-12-30 DIAGNOSIS — J449 Chronic obstructive pulmonary disease, unspecified: Secondary | ICD-10-CM | POA: Insufficient documentation

## 2018-12-30 DIAGNOSIS — Z79811 Long term (current) use of aromatase inhibitors: Secondary | ICD-10-CM | POA: Insufficient documentation

## 2018-12-30 DIAGNOSIS — Z803 Family history of malignant neoplasm of breast: Secondary | ICD-10-CM | POA: Diagnosis not present

## 2018-12-30 DIAGNOSIS — E662 Morbid (severe) obesity with alveolar hypoventilation: Secondary | ICD-10-CM | POA: Diagnosis not present

## 2018-12-30 DIAGNOSIS — I272 Pulmonary hypertension, unspecified: Secondary | ICD-10-CM | POA: Diagnosis not present

## 2018-12-30 DIAGNOSIS — Z79899 Other long term (current) drug therapy: Secondary | ICD-10-CM | POA: Diagnosis not present

## 2018-12-30 DIAGNOSIS — J9611 Chronic respiratory failure with hypoxia: Secondary | ICD-10-CM | POA: Insufficient documentation

## 2018-12-30 DIAGNOSIS — R278 Other lack of coordination: Secondary | ICD-10-CM | POA: Diagnosis not present

## 2018-12-30 DIAGNOSIS — R296 Repeated falls: Secondary | ICD-10-CM | POA: Diagnosis not present

## 2018-12-30 DIAGNOSIS — K766 Portal hypertension: Secondary | ICD-10-CM

## 2018-12-30 DIAGNOSIS — I130 Hypertensive heart and chronic kidney disease with heart failure and stage 1 through stage 4 chronic kidney disease, or unspecified chronic kidney disease: Secondary | ICD-10-CM | POA: Insufficient documentation

## 2018-12-30 DIAGNOSIS — R2232 Localized swelling, mass and lump, left upper limb: Secondary | ICD-10-CM | POA: Insufficient documentation

## 2018-12-30 DIAGNOSIS — Z7982 Long term (current) use of aspirin: Secondary | ICD-10-CM | POA: Insufficient documentation

## 2018-12-30 DIAGNOSIS — Z7951 Long term (current) use of inhaled steroids: Secondary | ICD-10-CM | POA: Diagnosis not present

## 2018-12-30 LAB — COMPREHENSIVE METABOLIC PANEL
ALT: 42 U/L (ref 0–44)
AST: 85 U/L — ABNORMAL HIGH (ref 15–41)
Albumin: 2.9 g/dL — ABNORMAL LOW (ref 3.5–5.0)
Alkaline Phosphatase: 220 U/L — ABNORMAL HIGH (ref 38–126)
Anion gap: 10 (ref 5–15)
BUN: 16 mg/dL (ref 8–23)
CO2: 30 mmol/L (ref 22–32)
Calcium: 9.2 mg/dL (ref 8.9–10.3)
Chloride: 97 mmol/L — ABNORMAL LOW (ref 98–111)
Creatinine, Ser: 1.14 mg/dL — ABNORMAL HIGH (ref 0.44–1.00)
GFR calc Af Amer: 57 mL/min — ABNORMAL LOW (ref >60)
GFR calc non Af Amer: 49 mL/min — ABNORMAL LOW (ref >60)
Glucose, Bld: 153 mg/dL — ABNORMAL HIGH (ref 70–99)
Potassium: 4.5 mmol/L (ref 3.5–5.1)
Sodium: 137 mmol/L (ref 135–145)
Total Bilirubin: 1.3 mg/dL — ABNORMAL HIGH (ref 0.3–1.2)
Total Protein: 7.1 g/dL (ref 6.5–8.1)

## 2018-12-30 LAB — CBC
HCT: 37 % (ref 36.0–46.0)
Hemoglobin: 11.9 g/dL — ABNORMAL LOW (ref 12.0–15.0)
MCH: 29 pg (ref 26.0–34.0)
MCHC: 32.2 g/dL (ref 30.0–36.0)
MCV: 90 fL (ref 80.0–100.0)
Platelets: 164 10*3/uL (ref 150–400)
RBC: 4.11 MIL/uL (ref 3.87–5.11)
RDW: 18 % — ABNORMAL HIGH (ref 11.5–15.5)
WBC: 3.2 10*3/uL — ABNORMAL LOW (ref 4.0–10.5)
nRBC: 0 % (ref 0.0–0.2)

## 2018-12-30 LAB — TSH: TSH: 1.615 u[IU]/mL (ref 0.350–4.500)

## 2018-12-30 MED ORDER — MACITENTAN 10 MG PO TABS: 10.0000 mg | ORAL_TABLET | Freq: Every day | ORAL | 6 refills | Status: DC

## 2018-12-30 NOTE — Patient Instructions (Addendum)
START Opsumit 45m (1 tab) daily.  Office will fax prescription request to Pharmacy.  They will call you to discuss insurance coverage and mailing medicine to you.  Labs today We will only contact you if something comes back abnormal or we need to make some changes. Otherwise no news is good news!  Your physician recommends that you schedule a follow-up appointment in: 6 weeks with Dr MAundra Dubin   [facility] you and your health needs are our priority. As part of our continuing mission to provide you with exceptional heart care, we have created designated Provider Care Teams. These Care Teams include your primary Cardiologist (physician) and Advanced Practice Providers (APPs- Physician Assistants and Nurse Practitioners) who all work together to provide you with the care you need, when you need it.   You may see any of the following providers on your designated Care Team at your next follow up: .[name] KitchenDr DGlori Bickers. Dr DLoralie Champagne. ADarrick Grinder NP   Please be sure to bring in all your medications bottles to every appointment.

## 2018-12-30 NOTE — Telephone Encounter (Signed)
-----  Message from Larey Dresser, MD sent at 12/30/2018  4:22 PM EDT ----- No changes for now, but with elevated AST, would like to have her get another CMET done in 2 wks.

## 2018-12-30 NOTE — Telephone Encounter (Signed)
Spoke with nursing facility ([phone])  to get repeat CMET in 2 weeks, will fax results to our office

## 2018-12-30 NOTE — Progress Notes (Addendum)
Opsumit referral form faxed to [facility]. Confirmation received.

## 2018-12-31 DIAGNOSIS — M6281 Muscle weakness (generalized): Secondary | ICD-10-CM | POA: Diagnosis not present

## 2018-12-31 DIAGNOSIS — R262 Difficulty in walking, not elsewhere classified: Secondary | ICD-10-CM | POA: Diagnosis not present

## 2018-12-31 DIAGNOSIS — R278 Other lack of coordination: Secondary | ICD-10-CM | POA: Diagnosis not present

## 2018-12-31 DIAGNOSIS — R296 Repeated falls: Secondary | ICD-10-CM | POA: Diagnosis not present

## 2018-12-31 NOTE — Progress Notes (Signed)
PCP: Dr Burnett Sheng HF Cardiology: Dr. Aundra Dubin  HPI: Tammy Mathews is a 68 y.o.with history of breast cancer, obesity, HTN, hyperlipidemia, "COPD" but never smoked, chronic respiratory failure on 2 liters oxygen, and CKD stage 3.   Admitted from [facility] in 6/20 with worsening renal function and increased shortness of breath. She had a 50 pound weight 4 weeks prior to admit. At [facility] she was placed on IV diuretic with poor response and renal function worsened. Transferred to [facility]. HF team consulted. Diuresed with IV lasix+ diamox and later had RHC showing severe PAH with filling pressures still elevated. Eventuyally discharged to SNF. Discharge weight was 242 pounds. She remains in the SNF in [place].    Creatinine up to 2.69 in 7/20, torsemide cut back to 20 mg daily and off spironolactone.   She returns today for followup of CHF and pulmonary hypertension.  She is using 2 L home oxygen, cannot tolerate CPAP.  She is doing some walking with PT.  Not short of breath walking short distances with a walker or at rest.  Legs "give out" easily.  No orthopnea/PND.  No chest pain.  No lightheadedness or syncope.   ECG (6/20, personally reviewed): NSR, low voltage.   Labs (7/20): 1.65 => 2.69, K 5.6  PMH: 1. Chronic diastolic CHF: With prominent RV failure and pulmonary hypertension.  - Echo 10/2018: LVEF 60-65%, RV mildly dilated with moderately reduced systolic function, severely elevated RVSP 85 mmHg, D-shaped septum, and moderate TR.   2. Pulmonary hypertension: 6/20 echo with evidence for RV dysfunction as above.  - RHC (7/20): mean RA 14, PA 80/34 mean 53, PCWP 21, CI 3.8, PVR 4 WU.  - Pulmonary hypertension serologic workup: ANA, RF, anti-SCL70, anti-centromere, ANCA all negative.  - V/Q scan (6/20): No evidence for chronic PE.  - High resolution CT chest (6/20): No evidence for ILD.  3. OHS/OSA: Unable to tolerate CPAP, uses 2 L home oxygen at all times.  4. CKD: Stage 3.   5. Breast cancer 6. Type 2 DM 7. HTN 8. Hyperlipidemia  ROS: All systems negative except as listed in HPI, PMH and Problem List.  SH:  Social History   Socioeconomic History  . Marital status: Single    Spouse name: Not on file  . Number of children: Not on file  . Years of education: Not on file  . Highest education level: Not on file  Occupational History  . Not on file  Social Needs  . Financial resource strain: Not on file  . Food insecurity    Worry: Not on file    Inability: Not on file  . Transportation needs    Medical: Not on file    Non-medical: Not on file  Tobacco Use  . Smoking status: Never Smoker  . Smokeless tobacco: Never Used  Substance and Sexual Activity  . Alcohol use: Never    Frequency: Never  . Drug use: Never  . Sexual activity: Not on file  Lifestyle  . Physical activity    Days per week: Not on file    Minutes per session: Not on file  . Stress: Not on file  Relationships  . Social Herbalist on phone: Not on file    Gets together: Not on file    Attends religious service: Not on file    Active member of club or organization: Not on file    Attends meetings of clubs or organizations: Not on file  Relationship status: Not on file  . Intimate partner violence    Fear of current or ex partner: Not on file    Emotionally abused: Not on file    Physically abused: Not on file    Forced sexual activity: Not on file  Other Topics Concern  . Not on file  Social History Narrative  . Not on file    FH:  Family History  Problem Relation Age of Onset  . Heart disease Mother   . Colon cancer Mother   . Breast cancer Mother   . Emphysema Father        smoked  . Emphysema Brother        smoked    Current Outpatient Medications  Medication Sig Dispense Refill  . ACCU-CHEK AVIVA PLUS test strip     . acetaminophen (TYLENOL) 500 MG tablet Take 1,000 mg by mouth every 6 (six) hours as needed for mild pain.    [name] Kitchen albuterol  (PROVENTIL HFA;VENTOLIN HFA) 108 (90 Base) MCG/ACT inhaler Inhale 2 puffs into the lungs every 4 (four) hours as needed for wheezing or shortness of breath.     [name] Kitchen amiodarone (PACERONE) 100 MG tablet Take 1 tablet (100 mg total) by mouth daily. 30 tablet 0  . anastrozole (ARIMIDEX) 1 MG tablet Take 1 mg by mouth daily.    [name] Kitchen aspirin EC 81 MG tablet Take 81 mg by mouth daily.    . budesonide (PULMICORT) 0.5 MG/2ML nebulizer solution Inhale 2 mLs into the lungs every 6 (six) hours as needed for shortness of breath.    . budesonide-formoterol (SYMBICORT) 80-4.5 MCG/ACT inhaler Take 2 puffs first thing in am and then another 2 puffs about 12 hours later. 1 Inhaler 12  . insulin aspart (NOVOLOG) 100 UNIT/ML injection Sliding scale for glucose:  CBG < 70: Implement Hypoglycemia Protocol  CBG 70 - 120: 0 units  CBG 121 - 150: 1 unit  CBG 151 - 200: 2 units  CBG 201 - 250: 3 units  CBG 251 - 300: 5 units  CBG 301 - 350: 7 units  CBG 351 - 400 9 units  CBG > 400 Notify facility MD 10 mL 0  . Insulin Glargine (LANTUS SOLOSTAR) 100 UNIT/ML Solostar Pen Inject 8 Units into the skin at bedtime.    [name] Kitchen ipratropium-albuterol (DUONEB) 0.5-2.5 (3) MG/3ML SOLN Take 3 mLs by nebulization every 4 (four) hours as needed.    . loperamide (IMODIUM A-D) 2 MG tablet Take 2 mg by mouth 4 (four) times daily as needed for diarrhea or loose stools.    [name] Kitchen loratadine (CLARITIN) 10 MG tablet Take 10 mg by mouth daily as needed for allergies.    [name] Kitchen meclizine (ANTIVERT) 25 MG tablet Take 25 mg by mouth every 6 (six) hours as needed for dizziness.     . midodrine (PROAMATINE) 2.5 MG tablet Take 2.5 mg by mouth 3 (three) times daily with meals.    . OXYGEN 3 L continuous. AHC    . torsemide (DEMADEX) 20 MG tablet Take 1 tablet (20 mg total) by mouth daily. Please cancel all previous orders for current medication. Change in dosage or pill size. 30 tablet 6  . macitentan (OPSUMIT) 10 MG tablet Take 1 tablet (10 mg total) by mouth  daily. 30 tablet 6   No current facility-administered medications for this encounter.     Vitals:   12/30/18 1403  BP: 118/72  Pulse: 87  SpO2: 98%  Weight: 102.7 kg (226 lb  6.4 oz)   Wt Readings from Last 3 Encounters:  12/30/18 102.7 kg (226 lb 6.4 oz)  11/28/18 98.7 kg (217 lb 9.6 oz)  11/08/18 110.1 kg (242 lb 11.6 oz)    PHYSICAL EXAM: General: NAD Neck: No JVD, no thyromegaly or thyroid nodule.  Lungs: Decreased BS right base.  CV: Nondisplaced PMI.  Heart regular S1/S2, no S3/S4, no murmur.  Trace ankle edema.  No carotid bruit.  Normal pedal pulses.  Abdomen: Soft, nontender, no hepatosplenomegaly, no distention.  Skin: Intact without lesions or rashes.  Neurologic: Alert and oriented x 3.  Psych: Normal affect. Extremities: No clubbing or cyanosis.  HEENT: Normal.   ASSESSMENT & PLAN: 1. Chronic hypoxemic respiratory failure: She has been labeled as having COPD but never smoked. Based on body habitus, OHS/OSA is a more likely possibility. She is on 2 liters continuously. She is unable to tolerate CPAP.  - She will need eventual PFTs (when she gets out of SNF).   2. Chronic diastolic CHF with prominent RV failure: Echo (6/20) with EF 60-65%, severely dilated RV with moderately decreased RV systolic function, moderate TR, PASP 85 mmHg. [place] 7/20 showed severe pulmonary artery hypertension with ongoing volume overload.  NYHA class III symptoms, mainly limited by fatigue. She does not appear volume overloaded on exam.  Torsemide cut back and spironolactone stopped recently due to AKI.  - Continue torsemide 20 mg daily. BMET today.  - Continue Midodrine 2.5 mg three times a day to maintain BP.  May be able to stop soon. 3. Atrial fibrillation: Paroxysmal. Apparently she was on anticoagulation (not sure what) a while ago and had severe epistaxis. She stopped anticoagulation and does not want to restart. She is on ASA currently. Regular on exam.  - Continue amiodarone 100  mg daily. CT chest in 6/20 was not suggestive of amiodarone lung toxicity.  Check LFTs and TSH today.  She will need a regular lung exam with amiodarone use.  4. Pulmonary hypertension: Severe pulmonary hypertension by echo in 6/20 with RV dysfunction. Etiology uncertain. She has chronic hypoxemic respiratory failure. This certainly could be due to long-standing pulmonary hypertension, but also possibly due to OSH/OSA. She has never smoked so COPD unlikely. High resolution CT chest did not show evidence for ILD or emphysema, there was bibasilar atelectasis.  V/Q scan with perfusion defect likely due to atelectasis. Discussed with radiology, unlikely chronic PE and do not recommend further workup. Serologic workup for autoimmune causes of pulmonary hypertension was negative.  [place] in 7/20 showed severe pulmonary arterial hypertension.  Volume status today looks ok.  Group 1 PAH is a concern, suspect co-existing group 3 disease from OHS/OSA.  - She will start Opsumit 10 mg daily.  - If she tolerates this well, next choice will be [facility].  5. CKD stage 3: Check BMET today.  6. Mass in left axilla: She will need followup with her oncologist, she plans to call.    Followup in 6 wks.   Loralie Champagne 12/31/2018

## 2019-01-01 DIAGNOSIS — R262 Difficulty in walking, not elsewhere classified: Secondary | ICD-10-CM | POA: Diagnosis not present

## 2019-01-01 DIAGNOSIS — R296 Repeated falls: Secondary | ICD-10-CM | POA: Diagnosis not present

## 2019-01-01 DIAGNOSIS — M6281 Muscle weakness (generalized): Secondary | ICD-10-CM | POA: Diagnosis not present

## 2019-01-01 DIAGNOSIS — R278 Other lack of coordination: Secondary | ICD-10-CM | POA: Diagnosis not present

## 2019-01-02 DIAGNOSIS — M6281 Muscle weakness (generalized): Secondary | ICD-10-CM | POA: Diagnosis not present

## 2019-01-02 DIAGNOSIS — I110 Hypertensive heart disease with heart failure: Secondary | ICD-10-CM | POA: Diagnosis not present

## 2019-01-02 DIAGNOSIS — J431 Panlobular emphysema: Secondary | ICD-10-CM | POA: Diagnosis not present

## 2019-01-02 DIAGNOSIS — R262 Difficulty in walking, not elsewhere classified: Secondary | ICD-10-CM | POA: Diagnosis not present

## 2019-01-02 DIAGNOSIS — N183 Chronic kidney disease, stage 3 (moderate): Secondary | ICD-10-CM | POA: Diagnosis not present

## 2019-01-02 DIAGNOSIS — I5032 Chronic diastolic (congestive) heart failure: Secondary | ICD-10-CM | POA: Diagnosis not present

## 2019-01-02 DIAGNOSIS — R296 Repeated falls: Secondary | ICD-10-CM | POA: Diagnosis not present

## 2019-01-02 DIAGNOSIS — R278 Other lack of coordination: Secondary | ICD-10-CM | POA: Diagnosis not present

## 2019-01-03 DIAGNOSIS — R262 Difficulty in walking, not elsewhere classified: Secondary | ICD-10-CM | POA: Diagnosis not present

## 2019-01-03 DIAGNOSIS — M6281 Muscle weakness (generalized): Secondary | ICD-10-CM | POA: Diagnosis not present

## 2019-01-03 DIAGNOSIS — R278 Other lack of coordination: Secondary | ICD-10-CM | POA: Diagnosis not present

## 2019-01-03 DIAGNOSIS — R296 Repeated falls: Secondary | ICD-10-CM | POA: Diagnosis not present

## 2019-01-04 DIAGNOSIS — M6281 Muscle weakness (generalized): Secondary | ICD-10-CM | POA: Diagnosis not present

## 2019-01-04 DIAGNOSIS — R296 Repeated falls: Secondary | ICD-10-CM | POA: Diagnosis not present

## 2019-01-04 DIAGNOSIS — R278 Other lack of coordination: Secondary | ICD-10-CM | POA: Diagnosis not present

## 2019-01-04 DIAGNOSIS — E875 Hyperkalemia: Secondary | ICD-10-CM | POA: Diagnosis not present

## 2019-01-04 DIAGNOSIS — I5032 Chronic diastolic (congestive) heart failure: Secondary | ICD-10-CM | POA: Diagnosis not present

## 2019-01-04 DIAGNOSIS — E782 Mixed hyperlipidemia: Secondary | ICD-10-CM | POA: Diagnosis not present

## 2019-01-04 DIAGNOSIS — J431 Panlobular emphysema: Secondary | ICD-10-CM | POA: Diagnosis not present

## 2019-01-04 DIAGNOSIS — R262 Difficulty in walking, not elsewhere classified: Secondary | ICD-10-CM | POA: Diagnosis not present

## 2019-01-06 DIAGNOSIS — R262 Difficulty in walking, not elsewhere classified: Secondary | ICD-10-CM | POA: Diagnosis not present

## 2019-01-06 DIAGNOSIS — R296 Repeated falls: Secondary | ICD-10-CM | POA: Diagnosis not present

## 2019-01-06 DIAGNOSIS — R278 Other lack of coordination: Secondary | ICD-10-CM | POA: Diagnosis not present

## 2019-01-06 DIAGNOSIS — M6281 Muscle weakness (generalized): Secondary | ICD-10-CM | POA: Diagnosis not present

## 2019-01-06 DIAGNOSIS — Z03818 Encounter for observation for suspected exposure to other biological agents ruled out: Secondary | ICD-10-CM | POA: Diagnosis not present

## 2019-01-07 DIAGNOSIS — R278 Other lack of coordination: Secondary | ICD-10-CM | POA: Diagnosis not present

## 2019-01-07 DIAGNOSIS — R296 Repeated falls: Secondary | ICD-10-CM | POA: Diagnosis not present

## 2019-01-07 DIAGNOSIS — R1311 Dysphagia, oral phase: Secondary | ICD-10-CM | POA: Diagnosis not present

## 2019-01-08 ENCOUNTER — Telehealth (HOSPITAL_COMMUNITY): Payer: Self-pay | Admitting: Pharmacy Technician

## 2019-01-08 DIAGNOSIS — R1311 Dysphagia, oral phase: Secondary | ICD-10-CM | POA: Diagnosis not present

## 2019-01-08 DIAGNOSIS — R278 Other lack of coordination: Secondary | ICD-10-CM | POA: Diagnosis not present

## 2019-01-08 DIAGNOSIS — R296 Repeated falls: Secondary | ICD-10-CM | POA: Diagnosis not present

## 2019-01-08 NOTE — Telephone Encounter (Signed)
Received fax from [place] that [facility] requires a prior authorization for Opsumit. Filled out paperwork and sent in via fax.  Fax Number: [phone]  Phone [phone]  Will continue to follow.  Charlann Boxer, CPhT

## 2019-01-09 DIAGNOSIS — J9690 Respiratory failure, unspecified, unspecified whether with hypoxia or hypercapnia: Secondary | ICD-10-CM | POA: Diagnosis not present

## 2019-01-09 DIAGNOSIS — R278 Other lack of coordination: Secondary | ICD-10-CM | POA: Diagnosis not present

## 2019-01-09 DIAGNOSIS — R296 Repeated falls: Secondary | ICD-10-CM | POA: Diagnosis not present

## 2019-01-09 DIAGNOSIS — R062 Wheezing: Secondary | ICD-10-CM | POA: Diagnosis not present

## 2019-01-09 DIAGNOSIS — A4189 Other specified sepsis: Secondary | ICD-10-CM | POA: Diagnosis not present

## 2019-01-09 DIAGNOSIS — R1311 Dysphagia, oral phase: Secondary | ICD-10-CM | POA: Diagnosis not present

## 2019-01-10 DIAGNOSIS — R1311 Dysphagia, oral phase: Secondary | ICD-10-CM | POA: Diagnosis not present

## 2019-01-10 DIAGNOSIS — R278 Other lack of coordination: Secondary | ICD-10-CM | POA: Diagnosis not present

## 2019-01-10 DIAGNOSIS — R296 Repeated falls: Secondary | ICD-10-CM | POA: Diagnosis not present

## 2019-01-11 DIAGNOSIS — R1311 Dysphagia, oral phase: Secondary | ICD-10-CM | POA: Diagnosis not present

## 2019-01-11 DIAGNOSIS — R278 Other lack of coordination: Secondary | ICD-10-CM | POA: Diagnosis not present

## 2019-01-11 DIAGNOSIS — R296 Repeated falls: Secondary | ICD-10-CM | POA: Diagnosis not present

## 2019-01-13 DIAGNOSIS — Z03818 Encounter for observation for suspected exposure to other biological agents ruled out: Secondary | ICD-10-CM | POA: Diagnosis not present

## 2019-01-13 DIAGNOSIS — R278 Other lack of coordination: Secondary | ICD-10-CM | POA: Diagnosis not present

## 2019-01-13 DIAGNOSIS — R296 Repeated falls: Secondary | ICD-10-CM | POA: Diagnosis not present

## 2019-01-13 DIAGNOSIS — R1311 Dysphagia, oral phase: Secondary | ICD-10-CM | POA: Diagnosis not present

## 2019-01-14 DIAGNOSIS — N183 Chronic kidney disease, stage 3 (moderate): Secondary | ICD-10-CM | POA: Diagnosis not present

## 2019-01-14 DIAGNOSIS — I5032 Chronic diastolic (congestive) heart failure: Secondary | ICD-10-CM | POA: Diagnosis not present

## 2019-01-14 DIAGNOSIS — R1311 Dysphagia, oral phase: Secondary | ICD-10-CM | POA: Diagnosis not present

## 2019-01-14 DIAGNOSIS — E1122 Type 2 diabetes mellitus with diabetic chronic kidney disease: Secondary | ICD-10-CM | POA: Diagnosis not present

## 2019-01-14 DIAGNOSIS — R278 Other lack of coordination: Secondary | ICD-10-CM | POA: Diagnosis not present

## 2019-01-14 DIAGNOSIS — R296 Repeated falls: Secondary | ICD-10-CM | POA: Diagnosis not present

## 2019-01-14 NOTE — Telephone Encounter (Signed)
Sent in follow up questions for PA on Opsumit.  Will continue to follow up.  Charlann Boxer, CPhT

## 2019-01-15 DIAGNOSIS — R296 Repeated falls: Secondary | ICD-10-CM | POA: Diagnosis not present

## 2019-01-15 DIAGNOSIS — R278 Other lack of coordination: Secondary | ICD-10-CM | POA: Diagnosis not present

## 2019-01-15 DIAGNOSIS — R1311 Dysphagia, oral phase: Secondary | ICD-10-CM | POA: Diagnosis not present

## 2019-01-15 NOTE — Telephone Encounter (Signed)
Advanced Heart Failure Patient Advocate Encounter  Prior Authorization for Opsumit 12m has been approved.    PA# [id number] dates: 09/082020 through 05/08/2019  Patients co-pay is $0  Called [facility] to inform them of approval. They documented the approval dates and stated that the specialty pharmacy will call the patient to set up shipment.  ECharlann Boxer CPhT

## 2019-01-16 DIAGNOSIS — R278 Other lack of coordination: Secondary | ICD-10-CM | POA: Diagnosis not present

## 2019-01-16 DIAGNOSIS — R1311 Dysphagia, oral phase: Secondary | ICD-10-CM | POA: Diagnosis not present

## 2019-01-16 DIAGNOSIS — R296 Repeated falls: Secondary | ICD-10-CM | POA: Diagnosis not present

## 2019-01-17 DIAGNOSIS — R1311 Dysphagia, oral phase: Secondary | ICD-10-CM | POA: Diagnosis not present

## 2019-01-17 DIAGNOSIS — R278 Other lack of coordination: Secondary | ICD-10-CM | POA: Diagnosis not present

## 2019-01-17 DIAGNOSIS — D649 Anemia, unspecified: Secondary | ICD-10-CM | POA: Diagnosis not present

## 2019-01-17 DIAGNOSIS — I509 Heart failure, unspecified: Secondary | ICD-10-CM | POA: Diagnosis not present

## 2019-01-17 DIAGNOSIS — R296 Repeated falls: Secondary | ICD-10-CM | POA: Diagnosis not present

## 2019-01-20 DIAGNOSIS — R278 Other lack of coordination: Secondary | ICD-10-CM | POA: Diagnosis not present

## 2019-01-20 DIAGNOSIS — R1311 Dysphagia, oral phase: Secondary | ICD-10-CM | POA: Diagnosis not present

## 2019-01-20 DIAGNOSIS — Z03818 Encounter for observation for suspected exposure to other biological agents ruled out: Secondary | ICD-10-CM | POA: Diagnosis not present

## 2019-01-20 DIAGNOSIS — R296 Repeated falls: Secondary | ICD-10-CM | POA: Diagnosis not present

## 2019-01-21 DIAGNOSIS — R1311 Dysphagia, oral phase: Secondary | ICD-10-CM | POA: Diagnosis not present

## 2019-01-21 DIAGNOSIS — R278 Other lack of coordination: Secondary | ICD-10-CM | POA: Diagnosis not present

## 2019-01-21 DIAGNOSIS — R296 Repeated falls: Secondary | ICD-10-CM | POA: Diagnosis not present

## 2019-01-22 DIAGNOSIS — R296 Repeated falls: Secondary | ICD-10-CM | POA: Diagnosis not present

## 2019-01-22 DIAGNOSIS — R1311 Dysphagia, oral phase: Secondary | ICD-10-CM | POA: Diagnosis not present

## 2019-01-22 DIAGNOSIS — R278 Other lack of coordination: Secondary | ICD-10-CM | POA: Diagnosis not present

## 2019-01-23 DIAGNOSIS — R278 Other lack of coordination: Secondary | ICD-10-CM | POA: Diagnosis not present

## 2019-01-23 DIAGNOSIS — R1311 Dysphagia, oral phase: Secondary | ICD-10-CM | POA: Diagnosis not present

## 2019-01-23 DIAGNOSIS — R296 Repeated falls: Secondary | ICD-10-CM | POA: Diagnosis not present

## 2019-01-24 DIAGNOSIS — R1311 Dysphagia, oral phase: Secondary | ICD-10-CM | POA: Diagnosis not present

## 2019-01-24 DIAGNOSIS — R278 Other lack of coordination: Secondary | ICD-10-CM | POA: Diagnosis not present

## 2019-01-24 DIAGNOSIS — R296 Repeated falls: Secondary | ICD-10-CM | POA: Diagnosis not present

## 2019-01-25 DIAGNOSIS — R1311 Dysphagia, oral phase: Secondary | ICD-10-CM | POA: Diagnosis not present

## 2019-01-25 DIAGNOSIS — R296 Repeated falls: Secondary | ICD-10-CM | POA: Diagnosis not present

## 2019-01-25 DIAGNOSIS — I950 Idiopathic hypotension: Secondary | ICD-10-CM | POA: Diagnosis not present

## 2019-01-25 DIAGNOSIS — I110 Hypertensive heart disease with heart failure: Secondary | ICD-10-CM | POA: Diagnosis not present

## 2019-01-25 DIAGNOSIS — R278 Other lack of coordination: Secondary | ICD-10-CM | POA: Diagnosis not present

## 2019-01-25 DIAGNOSIS — E1122 Type 2 diabetes mellitus with diabetic chronic kidney disease: Secondary | ICD-10-CM | POA: Diagnosis not present

## 2019-01-25 DIAGNOSIS — E782 Mixed hyperlipidemia: Secondary | ICD-10-CM | POA: Diagnosis not present

## 2019-01-27 DIAGNOSIS — R296 Repeated falls: Secondary | ICD-10-CM | POA: Diagnosis not present

## 2019-01-27 DIAGNOSIS — R1311 Dysphagia, oral phase: Secondary | ICD-10-CM | POA: Diagnosis not present

## 2019-01-27 DIAGNOSIS — R278 Other lack of coordination: Secondary | ICD-10-CM | POA: Diagnosis not present

## 2019-01-28 DIAGNOSIS — R1311 Dysphagia, oral phase: Secondary | ICD-10-CM | POA: Diagnosis not present

## 2019-01-28 DIAGNOSIS — R278 Other lack of coordination: Secondary | ICD-10-CM | POA: Diagnosis not present

## 2019-01-28 DIAGNOSIS — R296 Repeated falls: Secondary | ICD-10-CM | POA: Diagnosis not present

## 2019-01-29 DIAGNOSIS — R296 Repeated falls: Secondary | ICD-10-CM | POA: Diagnosis not present

## 2019-01-29 DIAGNOSIS — R1311 Dysphagia, oral phase: Secondary | ICD-10-CM | POA: Diagnosis not present

## 2019-01-29 DIAGNOSIS — R278 Other lack of coordination: Secondary | ICD-10-CM | POA: Diagnosis not present

## 2019-01-30 DIAGNOSIS — N183 Chronic kidney disease, stage 3 (moderate): Secondary | ICD-10-CM | POA: Diagnosis not present

## 2019-01-30 DIAGNOSIS — I110 Hypertensive heart disease with heart failure: Secondary | ICD-10-CM | POA: Diagnosis not present

## 2019-01-30 DIAGNOSIS — R296 Repeated falls: Secondary | ICD-10-CM | POA: Diagnosis not present

## 2019-01-30 DIAGNOSIS — I5033 Acute on chronic diastolic (congestive) heart failure: Secondary | ICD-10-CM | POA: Diagnosis not present

## 2019-01-30 DIAGNOSIS — R278 Other lack of coordination: Secondary | ICD-10-CM | POA: Diagnosis not present

## 2019-01-30 DIAGNOSIS — J9611 Chronic respiratory failure with hypoxia: Secondary | ICD-10-CM | POA: Diagnosis not present

## 2019-01-30 DIAGNOSIS — R1311 Dysphagia, oral phase: Secondary | ICD-10-CM | POA: Diagnosis not present

## 2019-01-31 DIAGNOSIS — R1311 Dysphagia, oral phase: Secondary | ICD-10-CM | POA: Diagnosis not present

## 2019-01-31 DIAGNOSIS — R278 Other lack of coordination: Secondary | ICD-10-CM | POA: Diagnosis not present

## 2019-01-31 DIAGNOSIS — R296 Repeated falls: Secondary | ICD-10-CM | POA: Diagnosis not present

## 2019-02-02 DIAGNOSIS — R278 Other lack of coordination: Secondary | ICD-10-CM | POA: Diagnosis not present

## 2019-02-02 DIAGNOSIS — R1311 Dysphagia, oral phase: Secondary | ICD-10-CM | POA: Diagnosis not present

## 2019-02-02 DIAGNOSIS — R296 Repeated falls: Secondary | ICD-10-CM | POA: Diagnosis not present

## 2019-02-03 DIAGNOSIS — R278 Other lack of coordination: Secondary | ICD-10-CM | POA: Diagnosis not present

## 2019-02-03 DIAGNOSIS — R296 Repeated falls: Secondary | ICD-10-CM | POA: Diagnosis not present

## 2019-02-03 DIAGNOSIS — R1311 Dysphagia, oral phase: Secondary | ICD-10-CM | POA: Diagnosis not present

## 2019-02-04 DIAGNOSIS — R1311 Dysphagia, oral phase: Secondary | ICD-10-CM | POA: Diagnosis not present

## 2019-02-04 DIAGNOSIS — R296 Repeated falls: Secondary | ICD-10-CM | POA: Diagnosis not present

## 2019-02-04 DIAGNOSIS — E039 Hypothyroidism, unspecified: Secondary | ICD-10-CM | POA: Diagnosis not present

## 2019-02-04 DIAGNOSIS — D649 Anemia, unspecified: Secondary | ICD-10-CM | POA: Diagnosis not present

## 2019-02-04 DIAGNOSIS — I509 Heart failure, unspecified: Secondary | ICD-10-CM | POA: Diagnosis not present

## 2019-02-04 DIAGNOSIS — R278 Other lack of coordination: Secondary | ICD-10-CM | POA: Diagnosis not present

## 2019-02-04 DIAGNOSIS — E559 Vitamin D deficiency, unspecified: Secondary | ICD-10-CM | POA: Diagnosis not present

## 2019-02-04 DIAGNOSIS — E119 Type 2 diabetes mellitus without complications: Secondary | ICD-10-CM | POA: Diagnosis not present

## 2019-02-05 DIAGNOSIS — R296 Repeated falls: Secondary | ICD-10-CM | POA: Diagnosis not present

## 2019-02-05 DIAGNOSIS — R278 Other lack of coordination: Secondary | ICD-10-CM | POA: Diagnosis not present

## 2019-02-05 DIAGNOSIS — R1311 Dysphagia, oral phase: Secondary | ICD-10-CM | POA: Diagnosis not present

## 2019-02-10 ENCOUNTER — Encounter (HOSPITAL_COMMUNITY): Payer: Medicare Other | Admitting: Cardiology

## 2019-02-17 ENCOUNTER — Telehealth (HOSPITAL_COMMUNITY): Payer: Self-pay

## 2019-02-17 NOTE — Telephone Encounter (Signed)
No, would like for her to continue Opsumit.

## 2019-02-17 NOTE — Telephone Encounter (Signed)
Received message from [facility] that they are having difficulty servicing patient for opsumit due to her living in an assisted living facility.  They provided the number for them to set up a whole sale account so patient can get her medicines.  Is there any changes that you recommend at this time?  Routed to MD.

## 2019-02-18 NOTE — Telephone Encounter (Signed)
Received message from [place] [facility] that facility in which patient resides did not make order for her medication through whole sale supplier as they did provide the facility with the information to do so.

## 2019-02-18 NOTE — Telephone Encounter (Signed)
Can you see if a family member can take it to her?

## 2019-02-21 NOTE — Telephone Encounter (Signed)
Called patients emergency contact and spoke with Scherrie Bateman (patients brother in law).  I told him about the challenge of getting patients medicine to her as she is in a facility.  He said he would be able to help receive medicine and bring it to her.  Email sent to Butch Penny of [facility] to facilitate medication  Will f/u for response.

## 2019-02-21 NOTE — Telephone Encounter (Signed)
Spoke with patient, she is scheduled for an appt next week however currently covid +.  ;last day of quarantine is on 10/20.  Message sent to scheduler to reschedule her appt. Pt will return to [facility] after quarantine

## 2019-02-24 ENCOUNTER — Ambulatory Visit (HOSPITAL_COMMUNITY)
Admission: RE | Admit: 2019-02-24 | Discharge: 2019-02-24 | Disposition: A | Discharge status: Home / Self Care | Payer: Medicare Other | Source: Ambulatory Visit | Attending: Cardiology | Admitting: Cardiology

## 2019-02-24 DIAGNOSIS — I5032 Chronic diastolic (congestive) heart failure: Secondary | ICD-10-CM

## 2019-02-24 DIAGNOSIS — I272 Pulmonary hypertension, unspecified: Secondary | ICD-10-CM

## 2019-02-24 NOTE — Patient Instructions (Addendum)
START Opsumit 61m (1 tab) daily as soon as it becomes available. We are working with your brother in law to assist with getting this medication to you.  STOP Midodrine  Labs [facility] only contact you if something comes back abnormal or we need to make some changes. Otherwise no news is good news!  Please make a follow up appointment with your Oncologist, Dr MHinton Rao  Your physician recommends that you schedule a follow-up appointment in: 6 weeks with Dr MAundra Dubin   [facility] you and your health needs are our priority. As part of our continuing mission to provide you with exceptional heart care, we have created designated Provider Care Teams. These Care Teams include your primary Cardiologist (physician) and Advanced Practice Providers (APPs- Physician Assistants and Nurse Practitioners) who all work together to provide you with the care you need, when you need it.   You may see any of the following providers on your designated Care Team at your next follow up: .[name] KitchenDr DGlori Bickers. Dr DLoralie Champagne. ADarrick Grinder NP . BLyda Jester PA   Please be sure to bring in all your medications bottles to every appointment.

## 2019-02-24 NOTE — Telephone Encounter (Signed)
appt for today switched to virtual, pt appreciative.

## 2019-02-25 ENCOUNTER — Telehealth (HOSPITAL_COMMUNITY): Payer: Self-pay

## 2019-02-25 MED ORDER — MACITENTAN 10 MG PO TABS: 10.0000 mg | ORAL_TABLET | Freq: Every day | ORAL | 11 refills | Status: DC

## 2019-02-25 NOTE — Progress Notes (Signed)
Heart Failure TeleHealth Note  Due to national recommendations of social distancing due to [place] 19, Audio/video telehealth visit is felt to be most appropriate for this patient at this time.  See MyChart message from today for patient consent regarding telehealth for [facility].  Date:  02/25/2019   ID:  Tammy Mathews, DOB Oct 10, 1950, MRN [id number]  Location: Assisted living Provider location: [place] [facility] Type of Visit: Established patient   PCP:  Greig Right, MD  Cardiologist:  Dr. Aundra Dubin  Chief Complaint: Fatigue   History of Present Illness: Tammy Mathews is a 68 y.o. female who presents via audio/video conferencing for a telehealth visit today.     Patient has a known active COVID-19 infection but is asymptomatic.   Tammy Mathews is a 68 y.o.with history of breast cancer, obesity, HTN, hyperlipidemia, "COPD" but never smoked,chronic respiratory failure on 2 liters oxygen,andCKD stage 3.   Admitted from [facility] in 6/20 with worsening renal function and increased shortness of breath. She had a 50 pound weight 4 weeks prior to admit.At [facility] she was placed on IV diuretic with poor response and renal function worsened. Transferred to [facility]. HF team consulted. Diuresed with IV lasix+ diamox and later had RHC showing severe PAH with filling pressures still elevated. Eventually discharged to SNF. Discharge weight was 242 pounds. She remains in the SNF in [place].    Creatinine up to 2.69 in 7/20, torsemide cut back to 20 mg daily and off spironolactone.   She is using 2 L home oxygen, cannot tolerate CPAP.  She is currently in quarantine after positive COVID-19 test done as a routine screening at her assisted living.  She denies any symptoms (cough, new dyspnea, fever, etc). She is able to walk 800-900 feet with her walker with mild dyspnea.  No chest pain.  No palpitations.  No lightheadedness/syncope. She is still taking low  dose midodrine. She has not yet started Opsumit but it has been approved.    Labs (7/20): 1.65 => 2.69, K 5.6 Labs (8/20): K 4.5, creatinine 1.14, AST 85, ALT 42, AP 220, tbili 1.3, TSH normal.   PMH: 1. Chronic diastolic CHF: With prominent RV failure and pulmonary hypertension.  - Echo 10/2018: LVEF 60-65%, RV mildly dilated with moderately reduced systolic function, severely elevated RVSP 85 mmHg, D-shaped septum, and moderate TR.  2. Pulmonary hypertension: 6/20 echo with evidence for RV dysfunction as above.  - RHC (7/20): mean RA 14, PA 80/34 mean 53, PCWP 21, CI 3.8, PVR 4 WU.  - Pulmonary hypertension serologic workup: ANA, RF, anti-SCL70, anti-centromere, ANCA all negative.  - V/Q scan (6/20): No evidence for chronic PE.  - High resolution CT chest (6/20): No evidence for ILD.  3. OHS/OSA: Unable to tolerate CPAP, uses 2 L home oxygen at all times.  4. CKD: Stage 3.  5. Breast cancer 6. Type 2 DM 7. HTN 8. Hyperlipidemia 9. COVID-19 infection in 10/20  Current Outpatient Medications  Medication Sig Dispense Refill   ACCU-CHEK AVIVA PLUS test strip      acetaminophen (TYLENOL) 500 MG tablet Take 1,000 mg by mouth every 6 (six) hours as needed for mild pain.     albuterol (PROVENTIL HFA;VENTOLIN HFA) 108 (90 Base) MCG/ACT inhaler Inhale 2 puffs into the lungs every 4 (four) hours as needed for wheezing or shortness of breath.      amiodarone (PACERONE) 100 MG tablet Take 1 tablet (100 mg total) by mouth daily. [place]  tablet 0   anastrozole (ARIMIDEX) 1 MG tablet Take 1 mg by mouth daily.     aspirin EC 81 MG tablet Take 81 mg by mouth daily.     budesonide (PULMICORT) 0.5 MG/2ML nebulizer solution Inhale 2 mLs into the lungs every 6 (six) hours as needed for shortness of breath.     budesonide-formoterol (SYMBICORT) 80-4.5 MCG/ACT inhaler Take 2 puffs first thing in am and then another 2 puffs about 12 hours later. 1 Inhaler 12   insulin aspart (NOVOLOG) 100 UNIT/ML  injection Sliding scale for glucose:  CBG < 70: Implement Hypoglycemia Protocol  CBG 70 - 120: 0 units  CBG 121 - 150: 1 unit  CBG 151 - 200: 2 units  CBG 201 - 250: 3 units  CBG 251 - 300: 5 units  CBG 301 - 350: 7 units  CBG 351 - 400 9 units  CBG > 400 Notify facility MD 10 mL 0   Insulin Glargine (LANTUS SOLOSTAR) 100 UNIT/ML Solostar Pen Inject 8 Units into the skin at bedtime.     ipratropium-albuterol (DUONEB) 0.5-2.5 (3) MG/3ML SOLN Take 3 mLs by nebulization every 4 (four) hours as needed.     loperamide (IMODIUM A-D) 2 MG tablet Take 2 mg by mouth 4 (four) times daily as needed for diarrhea or loose stools.     loratadine (CLARITIN) 10 MG tablet Take 10 mg by mouth daily as needed for allergies.     macitentan (OPSUMIT) 10 MG tablet Take 1 tablet (10 mg total) by mouth daily. 30 tablet 6   meclizine (ANTIVERT) 25 MG tablet Take 25 mg by mouth every 6 (six) hours as needed for dizziness.      OXYGEN 3 L continuous. AHC     torsemide (DEMADEX) 20 MG tablet Take 1 tablet (20 mg total) by mouth daily. Please cancel all previous orders for current medication. Change in dosage or pill size. 30 tablet 6   No current facility-administered medications for this encounter.     Allergies:   Codeine and Morphine and related   Social History:  The patient  reports that she has never smoked. She has never used smokeless tobacco. She reports that she does not drink alcohol or use drugs.   Family History:  The patient's family history includes Breast cancer in her mother; Colon cancer in her mother; Emphysema in her brother and father; Heart disease in her mother.   ROS:  Please see the history of present illness.   All other systems are personally reviewed and negative.   Exam:  (Video/Tele Health Call; Exam is subjective and or/visual.) General:  Speaks in full sentences. No resp difficulty. Lungs: Normal respiratory effort with conversation.  Abdomen: Non-distended per patient  report Extremities: Pt denies edema. Neuro: Alert & oriented x 3.   Recent Labs: 10/27/2018: B Natriuretic Peptide 778.0 11/08/2018: Magnesium 1.9 12/30/2018: ALT 42; BUN 16; Creatinine, Ser 1.14; Hemoglobin 11.9; Platelets 164; Potassium 4.5; Sodium 137; TSH 1.615  Personally reviewed   Wt Readings from Last 3 Encounters:  12/30/18 102.7 kg (226 lb 6.4 oz)  11/28/18 98.7 kg (217 lb 9.6 oz)  11/08/18 110.1 kg (242 lb 11.6 oz)      ASSESSMENT AND PLAN:  1. Chronic hypoxemic respiratory failure: She has been labeled as having COPD but never smoked. Based on body habitus, OHS/OSA is a more likely possibility. She is on 2 liters continuously. She is unable to tolerate CPAP.  - She will need eventual PFTs (when  she gets out of SNF).   2. Chronic diastolic CHF with prominent RV failure: Echo (6/20) with EF 60-65%, severely dilated RV with moderately decreased RV systolic function, moderate TR, PASP 85 mmHg. RHC 7/20showed severe pulmonary artery hypertension with ongoing volume overload.  NYHA class III symptoms, mainly limited by fatigue.  Torsemide cut back and spironolactone stopped recently due to AKI.  - Continue torsemide 20 mg daily. I will arrange for BMET.  - BP has been stable no lightheadedness, I think that she can stop midodrine.  3. Atrial fibrillation: Paroxysmal. Apparently she was on anticoagulation (not sure what) a while ago and had severe epistaxis. She stopped anticoagulation and does not want to restart. She is on ASA currently. No palpitations.  - Continue amiodarone 100 mg daily. CT chest in 6/20 was not suggestive of amiodarone lung toxicity.  LFTs were elevated when checked in 8/20, need repeat LFTs.  She will need a regular eye exam with amiodarone use.  4. Pulmonary hypertension: Severe pulmonary hypertension by echo in 6/20 with RV dysfunction. Etiology uncertain. She has chronic hypoxemic respiratory failure. This certainly could be due to long-standing  pulmonary hypertension, but also possibly due to OSH/OSA. She has never smoked so COPD unlikely. High resolution CT chest did not show evidence for ILD or emphysema, there was bibasilar atelectasis. V/Q scan with perfusion defect likely due to atelectasis. Discussed with radiology, unlikely chronic PE and do not recommend further workup. Serologic workup for autoimmune causes of pulmonary hypertension was negative. [place] in 7/20 showed severe pulmonary arterial hypertension.  Volume status today looks ok.  Group 1 PAH is a concern, suspect co-existing group 3 disease from OHS/OSA.  - She will start Opsumit 10 mg daily now that it has been approved, her family will take it to her assisted living center.  - If she tolerates this well, next choice will be [facility].  5. CKD stage 3: Arrange for BMET.   6. Mass in left axilla: Noted on CT chest in 8/20.  At that time, I asked her to make followup with her oncologist but she still has not done it.  I talked with her oncologist Dr. Hinton Rao today and she will be calling the patient to come in for an appt.    COVID screen Patient was recently positive for COVID-19.  Patient Risk: After full review of this patients clinical status, I feel that they are at moderate risk for cardiac decompensation at this time.  Relevant cardiac medications were reviewed at length with the patient today. The patient does not have concerns regarding their medications at this time.   Recommended follow-up:  6 wks  Today, I have spent 18 minutes with the patient with telehealth technology discussing the above issues .    Signed, Loralie Champagne, MD  02/25/2019  [facility] [address] Heart and [place] [address] [phone] (office) [phone] (fax)

## 2019-02-25 NOTE — Telephone Encounter (Signed)
Spoke with Broadus John, Mudlogger at [facility]. Discussed concern patient is not able to get her medicine opsumit.  He advised that I fax prescription for medicine as well as AVS for patient.  Waiting for MD signature to fax all information. We continue to communicate to best facilitate to get patients medication to her. Pt will be arrive back at [facility].

## 2019-02-26 NOTE — Telephone Encounter (Signed)
Order for lab work and prescription for opsumit faxed to Rigby at [facility]. Will f/u to make sure they can facilitate getting medication and doing lab work

## 2019-02-28 NOTE — Telephone Encounter (Signed)
Called Tammy Mathews at [facility] as a follow up.  He notes that he received fax with recommendations and prescriptions.  They are working with nurse practitioner and pharmacy to get patient opsumit.  I gave him number to [facility] rep to assist.  He also notes that lab work will be done as well as prescription was forwarded to the unit in which patient is on.  He confirmed stopping midodrine as recommended.  Will continue to follow.

## 2019-03-04 ENCOUNTER — Telehealth (HOSPITAL_COMMUNITY): Payer: Self-pay | Admitting: Pharmacy Technician

## 2019-03-04 NOTE — Telephone Encounter (Signed)
Received a message from Rowland Lathe from skilled nursing facility that they have had issues getting Opsumit from [facility]. I called [facility] and [facility] wanted the skilled nursing facility to order the medication wholesale, which they cannot.   [facility] is sending [facility] a message to re-open the case. I will call tomorrow and speak with [facility] in order to get Opsumit sent to her family members home Elvia Collum, sister). Will call Seth Bake at [phone] and family once I have spoke with [facility].  [facility] Phone #: [place], CPhT

## 2019-03-05 NOTE — Telephone Encounter (Signed)
I called [facility] and they did receive the notice from [facility] to re-open patient's case of assistance with Opsumit. Updated phone numbers and explained that we would need to ship to patients sister Tammy Mathews (gave her phone numbers). Gave Andrea's information as well as mine. They are going to work on the case and be in touch with Seth Bake and I.   Called and left Seth Bake a voicemail to update her with the information that I have at this time.  Will follow up as information comes available.   Charlann Boxer, CPhT

## 2019-03-10 NOTE — Telephone Encounter (Signed)
Called and spoke with [facility], Tammy Mathews who has been handling the patient's case for patient assistance. Although co-pay is $0, [facility] is a SNF and the facility would have to apply for an application to buy wholesale from [facility] so that it can be shipped directly to the patient if their pharmacy, [facility] cannot supply the medication (since its LDD, they likely cannot dispense in house). There is no way around this, as they cannot send the medication to her sister and have her take it to the SNF. Vaughan Basta claims that the office manager Felicia, and RN Nita Sells have been who they have had contact with.  I called and spoke with Seth Bake to help clarify what needs to be done and for her to check and see if either options have been looked into. She is going to give me a call back after she speaks with those involved. At this point, the SNF has to handle their portion of investigating and filling out the application for wholesale before we can go further.   Gave her [facility]'s phone number and the phone number to their wholesale department to complete the application and set up an account [phone]).  Will follow up with this.  Charlann Boxer, CPhT

## 2019-03-12 NOTE — Telephone Encounter (Signed)
Jonni Sanger called to update me on the status of patient's Opsumit. Upon calling the number for [facility] that I provided, she found out that the wholesale application has to come from the pharmacy and not the facility itself. She is going to speak with the DON and administration there at the SNF to see what contact person they have for [employer] to get the application sent in. After getting approval of the application they have to then fill out REMS paperwork for the facility as well. After all that has been approved, they can proceed with filling the Opsumit. She is going to keep me in the loop of how things are progressing.   I told her that I would be more than happy to help assist in any way that I can and to let me know if they have a hard time getting the pharmacy to fill out the application.  Will continue to follow and assist as information becomes available.   Charlann Boxer, CPhT

## 2019-03-17 NOTE — Telephone Encounter (Signed)
We have been getting her Opsumit in the past, need to figure out how to get this for her.  She does not need to be on Tracleer.   Lauren, can you get involved with this to help? Linus Orn can explain what has been going on.

## 2019-03-17 NOTE — Telephone Encounter (Signed)
Tammy Holler, NP with [facility] requested a call back about opsumit stating they cant get it through their pharmacy because their pharmacy wont pay for it.  Call back #[phone]  Routed to [facility], CPhT

## 2019-03-17 NOTE — Telephone Encounter (Signed)
Routed to [place] for advice.  Please see previous message below.

## 2019-03-17 NOTE — Telephone Encounter (Signed)
Kathlee Nations and I have been working to try and get [facility]'s pharmacy to complete the wholesale application so they can order the Opsumit. We will continue to follow-up to make sure this occurs and is done correctly.

## 2019-03-17 NOTE — Telephone Encounter (Signed)
Spoke with Rod Holler at [facility], went over the process that I explained to [place]. Rod Holler stated that the pharmacy is saying that Medicaid won't pay for Opsumit, the pharmacy they use suggested Tracleer. Sent her back to the triage line so that we can ask the provider if that is an option.  Charlann Boxer, CPhT

## 2019-03-19 NOTE — Telephone Encounter (Signed)
Spoke with Rod Holler NP at [facility]. The point of contact that [facility] has for [place] is Ginger Carne, [phone] and [phone]. Called both numbers, was not able to leave a message on [phone], left a detailed message on [phone]. Will follow up to see what can be done to get the wholesale application sent through to [place].  Charlann Boxer, CPhT

## 2019-03-20 NOTE — Telephone Encounter (Signed)
Called and left another voicemail for Tammy Mathews.  Will reach back out to her. If I do not get a call back, I will try the facility again and see if there is any other form of communication I can use to get in touch with her.  Charlann Boxer, CPhT

## 2019-03-24 NOTE — Telephone Encounter (Signed)
Called and left both Judson Roch (with the pharmacy) and Seth Bake (with snf) messages to call back.  Charlann Boxer, CPhT

## 2019-03-27 NOTE — Telephone Encounter (Signed)
Spoke with Judson Roch on Monday, she is supposed to be getting with billing to find out if Medicaid will pay for her Opsumit while she is in the snf. Called and left her a message to check in.  Charlann Boxer, CPhT

## 2019-03-28 NOTE — Telephone Encounter (Signed)
Called and left Judson Roch another message.  Will follow up.  Charlann Boxer, CPhT

## 2019-04-02 NOTE — Telephone Encounter (Signed)
Called Judson Roch again, left another message. Have not heard from her since she was supposed to find out if Medicaid would pay for Opsumit while in SNF.  Charlann Boxer, CPhT

## 2019-04-10 ENCOUNTER — Encounter (HOSPITAL_COMMUNITY): Payer: Medicare Other | Admitting: Cardiology

## 2019-04-12 IMAGING — DX DG CHEST 2V
2 series · 2 of 2 positions shown · non-contrast
Comparison: none

[chest pa]
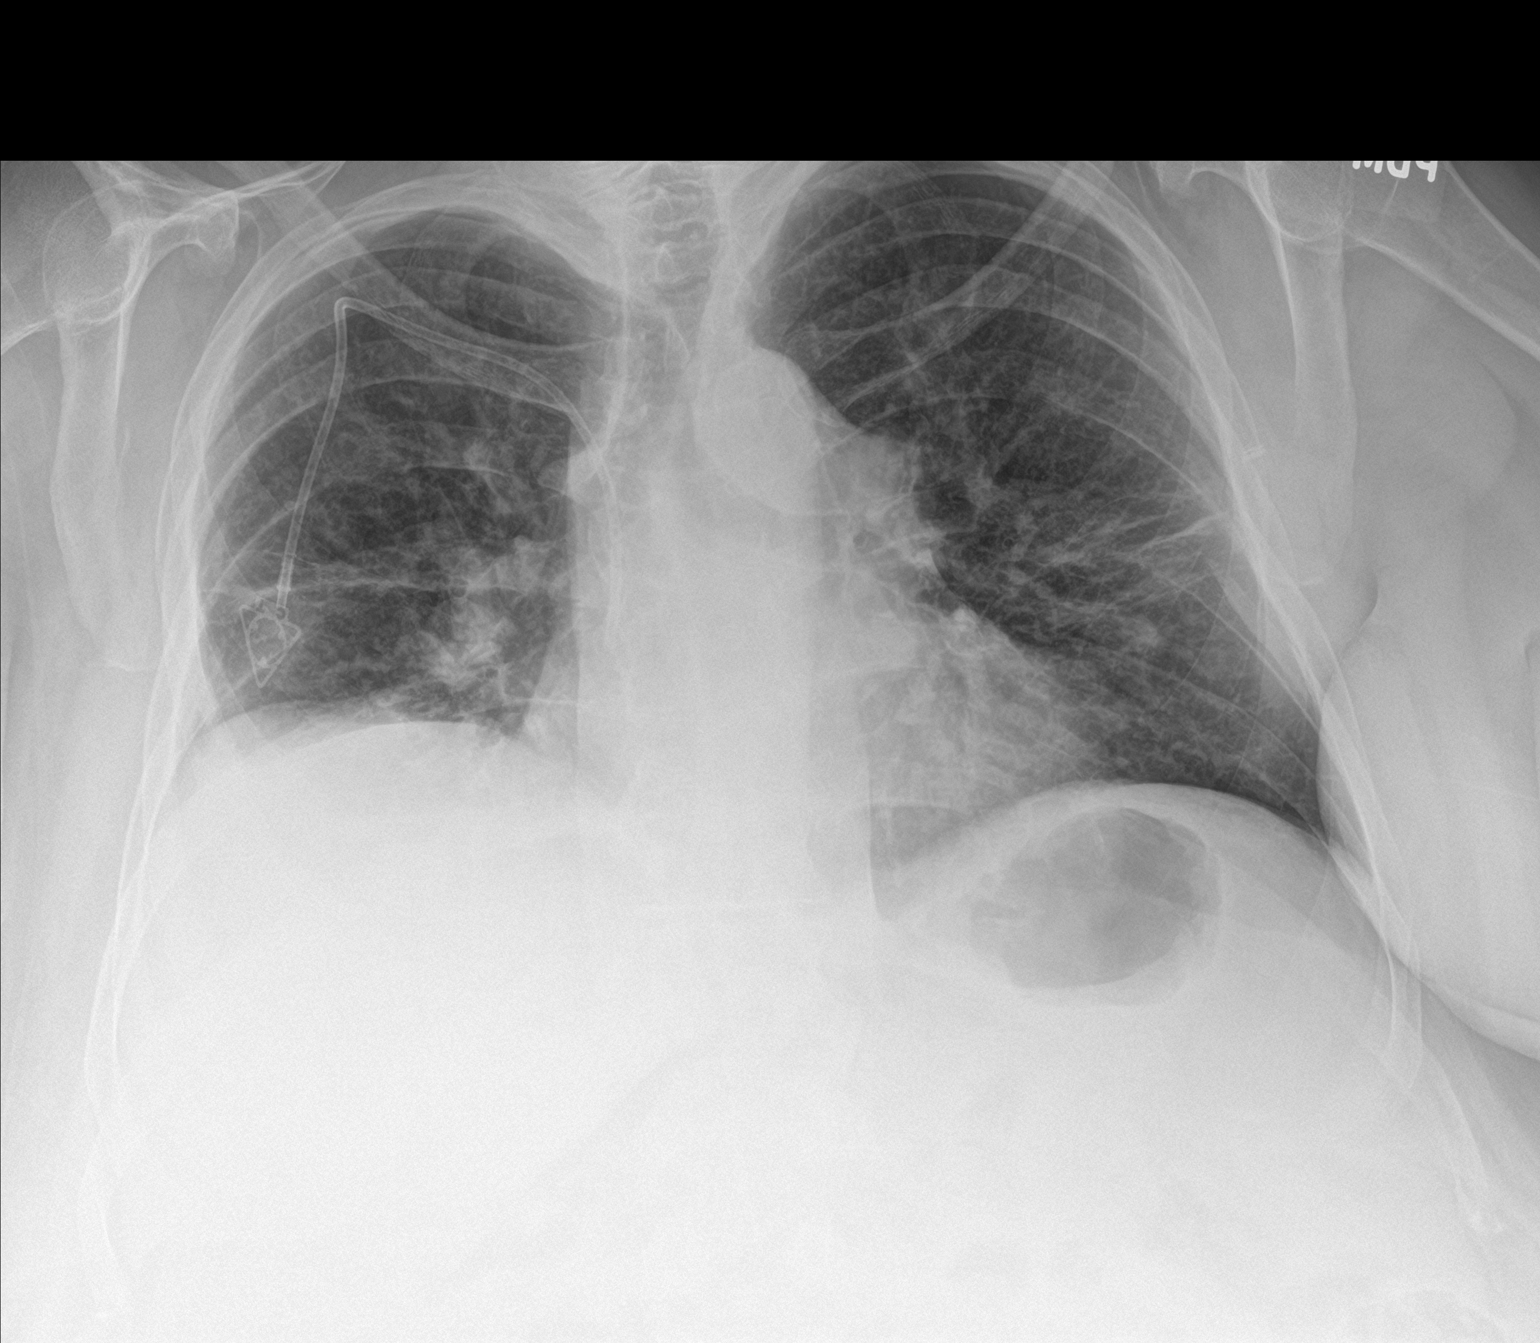

[chest lat]
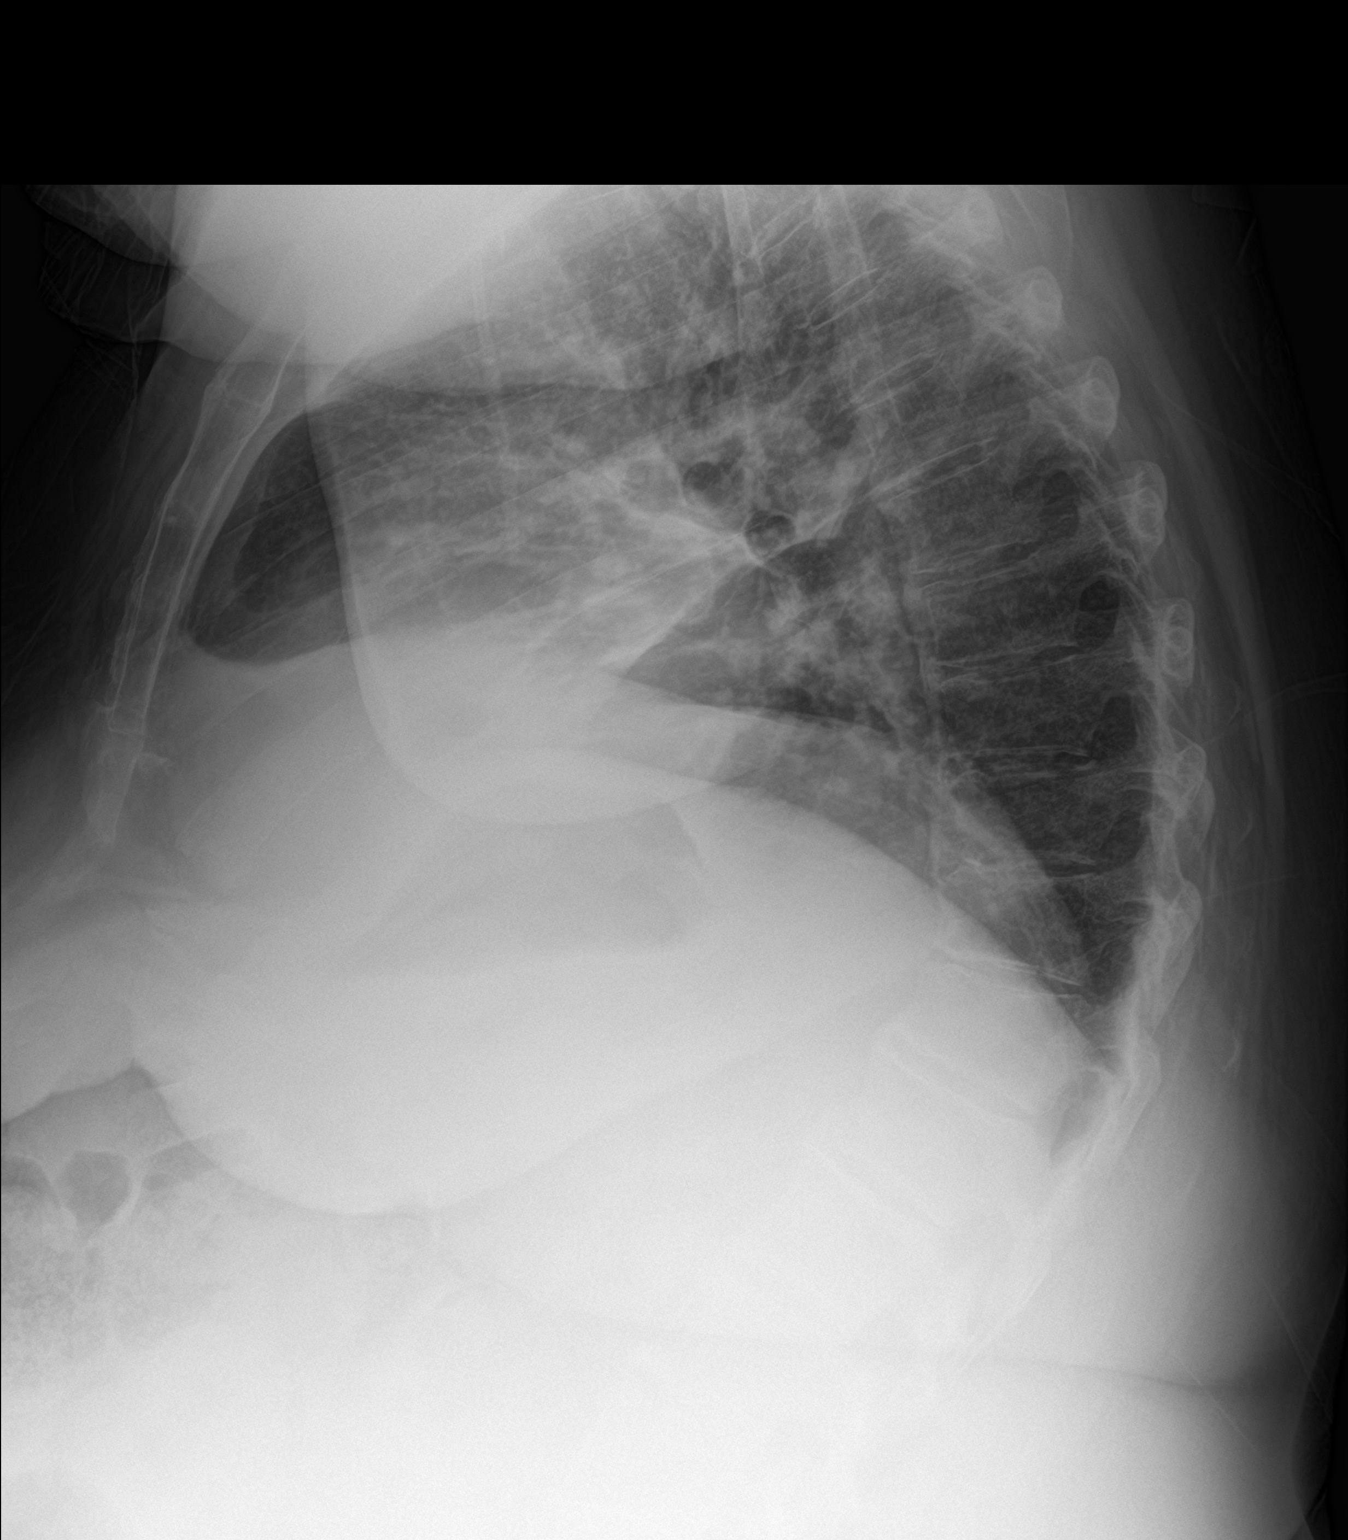

[2 of 2 positions shown; findings below may reference images not displayed]

Canned report from images found in remote index.

Refer to host system for actual result text.

## 2019-04-18 ENCOUNTER — Telehealth (HOSPITAL_COMMUNITY): Payer: Self-pay | Admitting: Cardiology

## 2019-04-18 ENCOUNTER — Telehealth (HOSPITAL_COMMUNITY): Payer: Self-pay | Admitting: Pharmacist

## 2019-04-18 MED ORDER — AMBRISENTAN 10 MG PO TABS: 10.0000 mg | ORAL_TABLET | Freq: Every day | ORAL | 11 refills | Status: AC

## 2019-04-18 MED ORDER — AMBRISENTAN 10 MG PO TABS: 10.0000 mg | ORAL_TABLET | Freq: Every day | ORAL | 11 refills | Status: DC

## 2019-04-18 NOTE — Telephone Encounter (Signed)
rx printed for pulm htn meds to be filled at [facility] pharmacy

## 2019-04-18 NOTE — Telephone Encounter (Signed)
Have reviewed Tammy Mathews's case and it appears that there are multiple barriers preventing Tammy Mathews from getting her access to Opsumit.   1) Since patient is in a SNF, Medicare Part D is not paying for her medication - it will have to be billed through [facility] Medicaid. This means that although she has a $0 copay for Opsumit through her part D plan, since Opsumit is non-preferred by Medicaid, her therapy would need to be changed to a preferred ERA on Medicaid's formulary. Currently, Letairis and Tracleer are the preferred ERA's for Medicaid. Only Letairis would be an acceptable option. Dr. Aundra Dubin has given permission to change therapy from Opsumit to Tammy Mathews. I will update medication list.   2) [facility] is unable to send the medication directly to the daughter and have her bring it into the SNF because she is considered an inpatient. Instead, [facility] informed the pharmacy representative Tammy Mathews) that they will need to submit a Wholesaler's application to be able to buy the medication directly. Only then will she be able to obtain the medication. -Of note, we have had difficulties consistently reaching Tammy to gauge progress. See prior telephone notes.  -Update: I was able to speak with Tammy Mathews today. She has the credit application for [employer] application. They will work on getting access to the medication. They are aware they will also need to complete the pharmacy REMS enrollment if not done previously.   Will continue to follow.   Audry Riles, PharmD, BCPS, BCCP, CPP Heart Failure Clinic Pharmacist [phone]

## 2019-05-16 ENCOUNTER — Telehealth (HOSPITAL_COMMUNITY): Payer: Self-pay | Admitting: Pharmacist

## 2019-05-16 NOTE — Telephone Encounter (Signed)
[facility] REMS patient enrollment form completed and faxed.  Audry Riles, PharmD, BCPS, BCCP, CPP Heart Failure Clinic Pharmacist [phone]

## 2019-06-04 ENCOUNTER — Inpatient Hospital Stay (HOSPITAL_COMMUNITY)
Admission: EM | Admit: 2019-06-04 | Discharge: 2019-07-07 | DRG: 314 | Disposition: E | Payer: Medicare Other | Source: Other Acute Inpatient Hospital | Attending: Emergency Medicine | Admitting: Emergency Medicine

## 2019-06-04 ENCOUNTER — Other Ambulatory Visit: Payer: Self-pay

## 2019-06-04 DIAGNOSIS — N179 Acute kidney failure, unspecified: Secondary | ICD-10-CM | POA: Diagnosis present

## 2019-06-04 DIAGNOSIS — Z79899 Other long term (current) drug therapy: Secondary | ICD-10-CM

## 2019-06-04 DIAGNOSIS — Z1612 Extended spectrum beta lactamase (ESBL) resistance: Secondary | ICD-10-CM | POA: Diagnosis present

## 2019-06-04 DIAGNOSIS — R55 Syncope and collapse: Secondary | ICD-10-CM | POA: Diagnosis present

## 2019-06-04 DIAGNOSIS — Z7189 Other specified counseling: Secondary | ICD-10-CM | POA: Diagnosis not present

## 2019-06-04 DIAGNOSIS — I130 Hypertensive heart and chronic kidney disease with heart failure and stage 1 through stage 4 chronic kidney disease, or unspecified chronic kidney disease: Secondary | ICD-10-CM | POA: Diagnosis present

## 2019-06-04 DIAGNOSIS — I517 Cardiomegaly: Secondary | ICD-10-CM | POA: Diagnosis not present

## 2019-06-04 DIAGNOSIS — E871 Hypo-osmolality and hyponatremia: Secondary | ICD-10-CM | POA: Diagnosis present

## 2019-06-04 DIAGNOSIS — B9620 Unspecified Escherichia coli [E. coli] as the cause of diseases classified elsewhere: Secondary | ICD-10-CM | POA: Diagnosis present

## 2019-06-04 DIAGNOSIS — I2721 Secondary pulmonary arterial hypertension: Secondary | ICD-10-CM | POA: Diagnosis present

## 2019-06-04 DIAGNOSIS — E118 Type 2 diabetes mellitus with unspecified complications: Secondary | ICD-10-CM | POA: Diagnosis present

## 2019-06-04 DIAGNOSIS — I2723 Pulmonary hypertension due to lung diseases and hypoxia: Secondary | ICD-10-CM | POA: Diagnosis present

## 2019-06-04 DIAGNOSIS — I4819 Other persistent atrial fibrillation: Secondary | ICD-10-CM | POA: Diagnosis present

## 2019-06-04 DIAGNOSIS — A419 Sepsis, unspecified organism: Secondary | ICD-10-CM | POA: Diagnosis present

## 2019-06-04 DIAGNOSIS — J449 Chronic obstructive pulmonary disease, unspecified: Secondary | ICD-10-CM | POA: Diagnosis present

## 2019-06-04 DIAGNOSIS — I361 Nonrheumatic tricuspid (valve) insufficiency: Secondary | ICD-10-CM | POA: Diagnosis not present

## 2019-06-04 DIAGNOSIS — Z9981 Dependence on supplemental oxygen: Secondary | ICD-10-CM

## 2019-06-04 DIAGNOSIS — E785 Hyperlipidemia, unspecified: Secondary | ICD-10-CM | POA: Diagnosis present

## 2019-06-04 DIAGNOSIS — R404 Transient alteration of awareness: Secondary | ICD-10-CM | POA: Diagnosis not present

## 2019-06-04 DIAGNOSIS — E662 Morbid (severe) obesity with alveolar hypoventilation: Secondary | ICD-10-CM | POA: Diagnosis present

## 2019-06-04 DIAGNOSIS — J439 Emphysema, unspecified: Secondary | ICD-10-CM | POA: Diagnosis present

## 2019-06-04 DIAGNOSIS — G928 Other toxic encephalopathy: Secondary | ICD-10-CM | POA: Diagnosis not present

## 2019-06-04 DIAGNOSIS — R6521 Severe sepsis with septic shock: Secondary | ICD-10-CM | POA: Diagnosis not present

## 2019-06-04 DIAGNOSIS — N183 Chronic kidney disease, stage 3 unspecified: Secondary | ICD-10-CM | POA: Diagnosis present

## 2019-06-04 DIAGNOSIS — Z1624 Resistance to multiple antibiotics: Secondary | ICD-10-CM | POA: Diagnosis present

## 2019-06-04 DIAGNOSIS — Z825 Family history of asthma and other chronic lower respiratory diseases: Secondary | ICD-10-CM

## 2019-06-04 DIAGNOSIS — I272 Pulmonary hypertension, unspecified: Secondary | ICD-10-CM | POA: Diagnosis present

## 2019-06-04 DIAGNOSIS — I878 Other specified disorders of veins: Secondary | ICD-10-CM | POA: Diagnosis present

## 2019-06-04 DIAGNOSIS — I50810 Right heart failure, unspecified: Secondary | ICD-10-CM | POA: Diagnosis not present

## 2019-06-04 DIAGNOSIS — Z66 Do not resuscitate: Secondary | ICD-10-CM | POA: Diagnosis present

## 2019-06-04 DIAGNOSIS — R570 Cardiogenic shock: Secondary | ICD-10-CM | POA: Diagnosis present

## 2019-06-04 DIAGNOSIS — D849 Immunodeficiency, unspecified: Secondary | ICD-10-CM | POA: Diagnosis present

## 2019-06-04 DIAGNOSIS — J9622 Acute and chronic respiratory failure with hypercapnia: Secondary | ICD-10-CM | POA: Diagnosis present

## 2019-06-04 DIAGNOSIS — Z8249 Family history of ischemic heart disease and other diseases of the circulatory system: Secondary | ICD-10-CM

## 2019-06-04 DIAGNOSIS — E875 Hyperkalemia: Secondary | ICD-10-CM | POA: Diagnosis present

## 2019-06-04 DIAGNOSIS — J9612 Chronic respiratory failure with hypercapnia: Secondary | ICD-10-CM | POA: Diagnosis present

## 2019-06-04 DIAGNOSIS — I50813 Acute on chronic right heart failure: Secondary | ICD-10-CM | POA: Diagnosis present

## 2019-06-04 DIAGNOSIS — I480 Paroxysmal atrial fibrillation: Secondary | ICD-10-CM | POA: Diagnosis present

## 2019-06-04 DIAGNOSIS — D649 Anemia, unspecified: Secondary | ICD-10-CM | POA: Diagnosis present

## 2019-06-04 DIAGNOSIS — I5033 Acute on chronic diastolic (congestive) heart failure: Secondary | ICD-10-CM | POA: Diagnosis not present

## 2019-06-04 DIAGNOSIS — G92 Toxic encephalopathy: Secondary | ICD-10-CM | POA: Diagnosis not present

## 2019-06-04 DIAGNOSIS — J9611 Chronic respiratory failure with hypoxia: Secondary | ICD-10-CM | POA: Diagnosis present

## 2019-06-04 DIAGNOSIS — Z515 Encounter for palliative care: Secondary | ICD-10-CM | POA: Diagnosis not present

## 2019-06-04 DIAGNOSIS — J45909 Unspecified asthma, uncomplicated: Secondary | ICD-10-CM | POA: Diagnosis present

## 2019-06-04 DIAGNOSIS — I2609 Other pulmonary embolism with acute cor pulmonale: Secondary | ICD-10-CM | POA: Diagnosis not present

## 2019-06-04 DIAGNOSIS — N189 Chronic kidney disease, unspecified: Secondary | ICD-10-CM | POA: Diagnosis present

## 2019-06-04 DIAGNOSIS — J9621 Acute and chronic respiratory failure with hypoxia: Secondary | ICD-10-CM | POA: Diagnosis present

## 2019-06-04 DIAGNOSIS — N390 Urinary tract infection, site not specified: Secondary | ICD-10-CM | POA: Diagnosis not present

## 2019-06-04 DIAGNOSIS — Z7982 Long term (current) use of aspirin: Secondary | ICD-10-CM

## 2019-06-04 DIAGNOSIS — C50911 Malignant neoplasm of unspecified site of right female breast: Secondary | ICD-10-CM | POA: Diagnosis present

## 2019-06-04 DIAGNOSIS — Z79811 Long term (current) use of aromatase inhibitors: Secondary | ICD-10-CM

## 2019-06-04 DIAGNOSIS — B9629 Other Escherichia coli [E. coli] as the cause of diseases classified elsewhere: Secondary | ICD-10-CM | POA: Diagnosis present

## 2019-06-04 DIAGNOSIS — Z6841 Body Mass Index (BMI) 40.0 and over, adult: Secondary | ICD-10-CM | POA: Diagnosis not present

## 2019-06-04 DIAGNOSIS — E1122 Type 2 diabetes mellitus with diabetic chronic kidney disease: Secondary | ICD-10-CM | POA: Diagnosis present

## 2019-06-04 DIAGNOSIS — Z7951 Long term (current) use of inhaled steroids: Secondary | ICD-10-CM

## 2019-06-04 DIAGNOSIS — R079 Chest pain, unspecified: Secondary | ICD-10-CM

## 2019-06-04 DIAGNOSIS — Z794 Long term (current) use of insulin: Secondary | ICD-10-CM

## 2019-06-04 DIAGNOSIS — Z885 Allergy status to narcotic agent status: Secondary | ICD-10-CM

## 2019-06-04 DIAGNOSIS — Z79890 Hormone replacement therapy (postmenopausal): Secondary | ICD-10-CM

## 2019-06-04 DIAGNOSIS — R609 Edema, unspecified: Secondary | ICD-10-CM | POA: Diagnosis not present

## 2019-06-04 LAB — GLUCOSE, CAPILLARY: Glucose-Capillary: 126 mg/dL — ABNORMAL HIGH (ref 70–99)

## 2019-06-04 MED ORDER — CHLORHEXIDINE GLUCONATE CLOTH 2 % EX PADS
6.0000 | MEDICATED_PAD | Freq: Every day | CUTANEOUS | Status: DC
  Administered 2019-06-05 – 2019-06-07 (×3): 6 via TOPICAL

## 2019-06-04 MED ORDER — ORAL CARE MOUTH RINSE
15.0000 mL | Freq: Two times a day (BID) | OROMUCOSAL | Status: DC
  Administered 2019-06-05 – 2019-06-07 (×6): 15 mL via OROMUCOSAL

## 2019-06-04 NOTE — Progress Notes (Addendum)
[facility] Progress Note Patient Name: Tammy Mathews DOB: [birth year] MRN: [id number]   Date of Service  05/21/2019  HPI/Events of Note  Pt transferred from [facility] ED earlier this evening . History is of out of hospital cardiac arrest with ROSC after 15 minutes. BNP and D-Dimer suspicious for possible PE . Pt  Creatinine is 2.9. Pt was transferred for further evaluation at [facility].  eICU Interventions  Heparin infusion to be continued, ECHO in a.m. to assess biventricular function, V/Q scan in a.m to r/o PE, bilateral lower ext dopplers tonight, Cycle Troponin's. PCCM ground crew to see and admit. New Patient Evaluation completed.        Kerry Kass Jamarkis Branam 06/02/2019, 11:13 PM

## 2019-06-05 ENCOUNTER — Inpatient Hospital Stay (HOSPITAL_COMMUNITY): Payer: Medicare Other

## 2019-06-05 DIAGNOSIS — I517 Cardiomegaly: Secondary | ICD-10-CM | POA: Diagnosis present

## 2019-06-05 DIAGNOSIS — R404 Transient alteration of awareness: Secondary | ICD-10-CM

## 2019-06-05 DIAGNOSIS — R55 Syncope and collapse: Secondary | ICD-10-CM | POA: Diagnosis present

## 2019-06-05 DIAGNOSIS — R609 Edema, unspecified: Secondary | ICD-10-CM

## 2019-06-05 DIAGNOSIS — I361 Nonrheumatic tricuspid (valve) insufficiency: Secondary | ICD-10-CM

## 2019-06-05 LAB — BLOOD GAS, ARTERIAL
Acid-Base Excess: 0.6 mmol/L (ref 0.0–2.0)
Bicarbonate: 25.9 mmol/L (ref 20.0–28.0)
FIO2: 40
O2 Saturation: 95.2 %
Patient temperature: 36.1
pCO2 arterial: 49.4 mmHg — ABNORMAL HIGH (ref 32.0–48.0)
pH, Arterial: 7.334 — ABNORMAL LOW (ref 7.350–7.450)
pO2, Arterial: 79.6 mmHg — ABNORMAL LOW (ref 83.0–108.0)

## 2019-06-05 LAB — COMPREHENSIVE METABOLIC PANEL
ALT: 31 U/L (ref 0–44)
AST: 47 U/L — ABNORMAL HIGH (ref 15–41)
Albumin: 3.2 g/dL — ABNORMAL LOW (ref 3.5–5.0)
Alkaline Phosphatase: 130 U/L — ABNORMAL HIGH (ref 38–126)
Anion gap: 11 (ref 5–15)
BUN: 83 mg/dL — ABNORMAL HIGH (ref 8–23)
CO2: 25 mmol/L (ref 22–32)
Calcium: 8.5 mg/dL — ABNORMAL LOW (ref 8.9–10.3)
Chloride: 92 mmol/L — ABNORMAL LOW (ref 98–111)
Creatinine, Ser: 3.05 mg/dL — ABNORMAL HIGH (ref 0.44–1.00)
GFR calc Af Amer: 17 mL/min — ABNORMAL LOW (ref >60)
GFR calc non Af Amer: 15 mL/min — ABNORMAL LOW (ref >60)
Glucose, Bld: 146 mg/dL — ABNORMAL HIGH (ref 70–99)
Potassium: 6 mmol/L — ABNORMAL HIGH (ref 3.5–5.1)
Sodium: 128 mmol/L — ABNORMAL LOW (ref 135–145)
Total Bilirubin: 1.3 mg/dL — ABNORMAL HIGH (ref 0.3–1.2)
Total Protein: 7.6 g/dL (ref 6.5–8.1)

## 2019-06-05 LAB — HEMOGLOBIN A1C
Hgb A1c MFr Bld: 6.3 % — ABNORMAL HIGH (ref 4.8–5.6)
Mean Plasma Glucose: 134.11 mg/dL

## 2019-06-05 LAB — TROPONIN I (HIGH SENSITIVITY)
Troponin I (High Sensitivity): 23 ng/L — ABNORMAL HIGH (ref <18)
Troponin I (High Sensitivity): 29 ng/L — ABNORMAL HIGH (ref <18)

## 2019-06-05 LAB — GLUCOSE, CAPILLARY
Glucose-Capillary: 125 mg/dL — ABNORMAL HIGH (ref 70–99)
Glucose-Capillary: 146 mg/dL — ABNORMAL HIGH (ref 70–99)
Glucose-Capillary: 147 mg/dL — ABNORMAL HIGH (ref 70–99)
Glucose-Capillary: 148 mg/dL — ABNORMAL HIGH (ref 70–99)
Glucose-Capillary: 154 mg/dL — ABNORMAL HIGH (ref 70–99)
Glucose-Capillary: 199 mg/dL — ABNORMAL HIGH (ref 70–99)

## 2019-06-05 LAB — PHOSPHORUS: Phosphorus: 6.3 mg/dL — ABNORMAL HIGH (ref 2.5–4.6)

## 2019-06-05 LAB — ECHOCARDIOGRAM COMPLETE
Height: 62 in
Weight: 4352.76 oz

## 2019-06-05 LAB — CBC
HCT: 27.6 % — ABNORMAL LOW (ref 36.0–46.0)
Hemoglobin: 8.2 g/dL — ABNORMAL LOW (ref 12.0–15.0)
MCH: 24.3 pg — ABNORMAL LOW (ref 26.0–34.0)
MCHC: 29.7 g/dL — ABNORMAL LOW (ref 30.0–36.0)
MCV: 81.9 fL (ref 80.0–100.0)
Platelets: 202 10*3/uL (ref 150–400)
RBC: 3.37 MIL/uL — ABNORMAL LOW (ref 3.87–5.11)
RDW: 15.8 % — ABNORMAL HIGH (ref 11.5–15.5)
WBC: 6.8 10*3/uL (ref 4.0–10.5)
nRBC: 0 % (ref 0.0–0.2)

## 2019-06-05 LAB — HEPARIN LEVEL (UNFRACTIONATED)
Heparin Unfractionated: <0.1 IU/mL — ABNORMAL LOW (ref 0.30–0.70)
Heparin Unfractionated: 0.23 IU/mL — ABNORMAL LOW (ref 0.30–0.70)

## 2019-06-05 LAB — MAGNESIUM: Magnesium: 2.6 mg/dL — ABNORMAL HIGH (ref 1.7–2.4)

## 2019-06-05 LAB — MRSA PCR SCREENING: MRSA by PCR: NEGATIVE

## 2019-06-05 LAB — PROCALCITONIN: Procalcitonin: 0.2 ng/mL

## 2019-06-05 MED ORDER — IPRATROPIUM-ALBUTEROL 0.5-2.5 (3) MG/3ML IN SOLN: 3.0000 mL | RESPIRATORY_TRACT | Status: DC | PRN

## 2019-06-05 MED ORDER — LORATADINE 10 MG PO TABS: 10.0000 mg | ORAL_TABLET | Freq: Every day | ORAL | Status: DC | PRN

## 2019-06-05 MED ORDER — PANTOPRAZOLE SODIUM 40 MG PO TBEC
40.0000 mg | DELAYED_RELEASE_TABLET | Freq: Every day | ORAL | Status: DC
  Administered 2019-06-05 – 2019-06-07 (×3): 40 mg via ORAL
  Filled 2019-06-05 (×3): qty 1

## 2019-06-05 MED ORDER — HEPARIN BOLUS VIA INFUSION
2000.0000 [IU] | Freq: Once | INTRAVENOUS | Status: AC
  Administered 2019-06-05: 2000 [IU] via INTRAVENOUS
  Filled 2019-06-05: qty 2000

## 2019-06-05 MED ORDER — HEPARIN (PORCINE) 25000 UT/250ML-% IV SOLN
1600.0000 [IU]/h | INTRAVENOUS | Status: DC
  Administered 2019-06-05: 1400 [IU]/h via INTRAVENOUS
  Administered 2019-06-06: 1600 [IU]/h via INTRAVENOUS
  Filled 2019-06-05 (×2): qty 250

## 2019-06-05 MED ORDER — CALCIUM GLUCONATE-NACL 1-0.675 GM/50ML-% IV SOLN
1.0000 g | Freq: Once | INTRAVENOUS | Status: AC
  Administered 2019-06-05: 1000 mg via INTRAVENOUS
  Filled 2019-06-05: qty 50

## 2019-06-05 MED ORDER — INSULIN ASPART 100 UNIT/ML ~~LOC~~ SOLN: 0.0000 [IU] | SUBCUTANEOUS | Status: DC

## 2019-06-05 MED ORDER — AMBRISENTAN 5 MG PO TABS
10.0000 mg | ORAL_TABLET | Freq: Every day | ORAL | Status: DC
  Administered 2019-06-05 – 2019-06-07 (×3): 10 mg via ORAL
  Filled 2019-06-05 (×4): qty 2

## 2019-06-05 MED ORDER — INSULIN ASPART 100 UNIT/ML ~~LOC~~ SOLN
1.0000 [IU] | SUBCUTANEOUS | Status: DC
  Administered 2019-06-05 (×2): 1 [IU] via SUBCUTANEOUS
  Administered 2019-06-05: 2 [IU] via SUBCUTANEOUS
  Administered 2019-06-05: 1 [IU] via SUBCUTANEOUS
  Administered 2019-06-05: 2 [IU] via SUBCUTANEOUS
  Administered 2019-06-06 – 2019-06-07 (×6): 1 [IU] via SUBCUTANEOUS
  Administered 2019-06-07: 2 [IU] via SUBCUTANEOUS
  Administered 2019-06-07 – 2019-06-08 (×4): 1 [IU] via SUBCUTANEOUS

## 2019-06-05 MED ORDER — ACETAMINOPHEN 500 MG PO TABS
1000.0000 mg | ORAL_TABLET | Freq: Four times a day (QID) | ORAL | Status: DC | PRN
  Administered 2019-06-05: 1000 mg via ORAL
  Filled 2019-06-05: qty 2

## 2019-06-05 MED ORDER — SODIUM ZIRCONIUM CYCLOSILICATE 10 G PO PACK
10.0000 g | PACK | Freq: Once | ORAL | Status: AC
  Administered 2019-06-05: 10 g via ORAL
  Filled 2019-06-05: qty 1

## 2019-06-05 MED ORDER — MOMETASONE FURO-FORMOTEROL FUM 100-5 MCG/ACT IN AERO
2.0000 | INHALATION_SPRAY | Freq: Two times a day (BID) | RESPIRATORY_TRACT | Status: DC
  Administered 2019-06-05 – 2019-06-07 (×6): 2 via RESPIRATORY_TRACT
  Filled 2019-06-05: qty 8.8

## 2019-06-05 MED ORDER — TRAMADOL HCL 50 MG PO TABS
50.0000 mg | ORAL_TABLET | Freq: Two times a day (BID) | ORAL | Status: DC | PRN
  Administered 2019-06-05 – 2019-06-07 (×5): 50 mg via ORAL
  Filled 2019-06-05 (×5): qty 1

## 2019-06-05 MED ORDER — HEPARIN (PORCINE) 25000 UT/250ML-% IV SOLN: 10.0000 [IU]/kg/h | INTRAVENOUS | Status: DC

## 2019-06-05 MED ORDER — AMIODARONE HCL 200 MG PO TABS
200.0000 mg | ORAL_TABLET | Freq: Every day | ORAL | Status: DC
  Administered 2019-06-05 – 2019-06-07 (×3): 200 mg via ORAL
  Filled 2019-06-05 (×3): qty 1

## 2019-06-05 MED ORDER — INSULIN ASPART 100 UNIT/ML ~~LOC~~ SOLN: 2.0000 [IU] | SUBCUTANEOUS | Status: DC

## 2019-06-05 MED ORDER — ASPIRIN 81 MG PO CHEW
81.0000 mg | CHEWABLE_TABLET | Freq: Every day | ORAL | Status: DC
  Administered 2019-06-05 – 2019-06-07 (×3): 81 mg via ORAL
  Filled 2019-06-05 (×3): qty 1

## 2019-06-05 NOTE — Evaluation (Signed)
Clinical/Bedside Swallow Evaluation Patient Details  Name: Tammy Mathews MRN: [id number] Date of Birth: [birth year]  Today's Date: 06/05/2019 Time: SLP Start Time (ACUTE ONLY): 1325 SLP Stop Time (ACUTE ONLY): 1340 SLP Time Calculation (min) (ACUTE ONLY): 15 min  Past Medical History:  Past Medical History:  Diagnosis Date  . Breast cancer ([place])   . COPD (chronic obstructive pulmonary disease) ([place])   . Diabetes mellitus type 2 in obese ([place])   . HLD (hyperlipidemia)   . HTN (hypertension)   . Morbid obesity ([place])    Past Surgical History:  Past Surgical History:  Procedure Laterality Date  . BREAST SURGERY    . HERNIA REPAIR    . RIGHT HEART CATH N/A 11/06/2018   Procedure: RIGHT HEART CATH;  Surgeon: Larey Dresser, MD;  Location: [facility];  Service: Cardiovascular;  Laterality: N/A;   HPI:  69yo female admitted 05/09/2019 following syncopal episode. PMH: severe Pulmonary HTN. HFpEF 60-65%. PAFib, acute on chronic kidney injury   Assessment / Plan / Recommendation Clinical Impression  Pt seen at bedside for swallow evaluation, due to coughing when drinking liquids earlier today. Pt reports tolerance of regular solids and thin liquids at home, without history of difficulty swallowing. She has ~3 teeth, no dentures. Oral motor strength and function are adequate. Pt accepted trials of thin liquid, puree, and solid textures. She was noted to be impulsive, taking large boluses quickly. No overt s/s aspiration observed following these trials, however, pt is at increased risk of aspiration given bolus size and rate. Recommend resuming regular diet and thin liquids, meds as tolerated. SLP will follow briefly for assessment of diet tolerance and to continue education.   SLP Visit Diagnosis: Dysphagia, unspecified (R13.10)    Aspiration Risk  Mild aspiration risk    Diet Recommendation Regular;Thin liquid   Liquid Administration via: Cup;Straw Medication Administration: Whole  meds with liquid Supervision: Patient able to self feed;Intermittent supervision to cue for compensatory strategies Compensations: Minimize environmental distractions;Slow rate;Small sips/bites Postural Changes: Seated upright at 90 degrees;Remain upright for at least 30 minutes after po intake    Other  Recommendations Oral Care Recommendations: Oral care BID   Follow up Recommendations None      Frequency and Duration min 1 x/week  1 week       Prognosis Prognosis for Safe Diet Advancement: Good      Swallow Study   General Date of Onset: 05/30/2019 HPI: 69yo female admitted 05/17/2019 following syncopal episode. PMH: severe Pulmonary HTN. HFpEF 60-65%. PAFib, acute on chronic kidney injury Previous Swallow Assessment: 10/2018 - BSE deferred - pt tolerating diet per RN. Diet Prior to this Study: NPO Temperature Spikes Noted: No Respiratory Status: Nasal cannula History of Recent Intubation: No Behavior/Cognition: Cooperative;Pleasant mood;Alert Oral Cavity Assessment: Within Functional Limits Oral Care Completed by SLP: No Oral Cavity - Dentition: Poor condition;Missing dentition Vision: Functional for self-feeding Self-Feeding Abilities: Able to feed self Patient Positioning: Upright in bed Baseline Vocal Quality: Normal Volitional Cough: Strong Volitional Swallow: Able to elicit    Oral/Motor/Sensory Function Overall Oral Motor/Sensory Function: Within functional limits   Ice Chips Ice chips: Not tested   Thin Liquid Thin Liquid: Within functional limits Presentation: Straw    Nectar Thick Nectar Thick Liquid: Not tested   Honey Thick Honey Thick Liquid: Not tested   Puree Puree: Within functional limits Presentation: Spoon;Self Fed   Solid     Solid: Within functional limits Presentation: [name], [facility], [place] Speech Language Pathologist Office: [phone] Pager: [phone]  Shonna Chock 06/05/2019,2:02 PM

## 2019-06-05 NOTE — Progress Notes (Signed)
  Echocardiogram 2D Echocardiogram has been performed.  Adeola Dennen A Daelen [place] 06/05/2019, 10:07 AM

## 2019-06-05 NOTE — H&P (Signed)
NAME:  Tammy Mathews, MRN:  [id number], DOB:  12/20/1950, LOS: 1 ADMISSION DATE:  05/28/2019, CONSULTATION DATE:  06/01/2019 REFERRING MD:  Oval Linsey ER, CHIEF COMPLAINT:  06/05/2019  Brief History   69 yoF with hx severe pulmonary hypertension, chronic RV failure, diastolic HF, chronic hypoxic respiratory failure on O2 and PAF not on AC presenting from SNF after sudden unresponsive episode.  CPR performed by SNF.  ROSC with EMS, since hemodynamically stable and neuro intact.  Concern for PE in ER and empirically started on heparin.  Transferred to [facility] for further evaluation.   History of present illness   69 year old female with prior hx of HFpEF, severe pulmonary hypertension, PAF (not on AC given prior severe epistaxis) chronic hypoxic respiratory failure on home 3L O2, DMT2, obesity, OSA, emphysema, COPD (although PFTs do not support this and never smoker), CKD stage III, right breast cancer, and vertigo.    On chart review, patient is followed by Dr. Aundra Dubin with HF team for her severe pulmonary hypertension, chronic RV failure, and diastolic HF.  She is on ambrisentan and baseline 3L O2.  Previous workup:  - Echo 10/2018: LVEF 60-65%, RV mildly dilated with moderately reduced systolic function, severely elevated RVSP 85 mmHg, D-shaped septum, and moderate TR. - RHC (7/20): mean RA 14, PA 80/34 mean 53, PCWP 21, CI 3.8, PVR 4 WU.  - Pulmonary hypertension serologic workup: ANA, RF, anti-SCL70, anti-centromere, ANCA all negative.  - V/Q scan (6/20): No evidence for chronic PE.  - High resolution CT chest (6/20): No evidence for ILD.  - PFT 11/18 with severe restriction   She resides at [facility] and [facility] in [place].  Staff had reported that patient went unresponsive when they were changing her LE dressings.  CPR performed for 15 mins by SNF staff.  On EMS arrival, patient with ROSC.  Patient reports increased SOB for 2-3 days, not been as ambulatory as she normally is and reports increased  LE swelling but no pain.  She remembers watching TV and then waking up with people over her.  States she has blacked out before once at rest after a laughing.  She reports intermittent dizziness from her vertigo.  Denies hx of seizures.  She was taken to [facility] ER where she remained hemodynamically stable and back on her baseline 3L with complaints of only chest wall pain from CPR.  Workup notable for ABG on 3L 7.4/ 50/ 65/ 32.5, Na 130, K 5, BUN 81, sCr 2.90, Lactic acid 1.5, CRP 29, troponin neg, pBNP 1800, PCT 0.29, Hgb 8.3, Hct 26.1, INR 1.2, d-dimer 2615, UA with +1 leuks, WBC clumps and +3 bacteria.  No urinary symptoms present but UC sent. SARS2 and flu negative. Unable to perform CTA PE given elevated sCr.  CXR with bibasilar atelectasis.  CT chest without contrast showed mild to moderate atelectasis and possible bilateral upper lobe and RLL infiltrate with small left pleural effusion. EKG NSR 74, no acute ST elevations.  Hemoccult was negative.  Given concern for acute PE, she was started on heparin gtt.  She remained hemodynamically stable, with satisfactory sats on 3L and neurologically intact without any focal deficits.  Patient to be transferred to [facility] for further evaluation and care, PCCM accepted.   Past Medical History  HFpEF, severe pulmonary hypertension, chronic hypoxic respiratory failure on home 3L O2, DMT2, obesity, OSA, emphysema, COPD, CKD stage III, right breast cancer, and vertigo.   Significant Hospital Events   1/27 tx [facility] ER  to [facility]  Consults:   Procedures:   Significant Diagnostic Tests:  1/27 CT chest ([facility])>> Mild to moderate severity areas of atelectasis and/or infiltrate within the bilateral upper lobes and right lower lobe.  Small left pleural effusion.  Mild cardiomegaly.   Micro Data:  1/27 SARS 2/ Flu A/ B>> neg 1/27 UC Oval Linsey) >>  Antimicrobials:  1/27 ceftriaxone   Interim history/subjective:  C/o of chest soreness from  compressions  Objective   Blood pressure (!) 87/66, pulse 70, temperature (!) 97.1 F (36.2 C), temperature source Oral, resp. rate (!) 23, height _0  (1.575 m), weight 123.4 kg, SpO2 96 %.       No intake or output data in the 24 hours ending 06/05/19 0040 Filed Weights   05/23/2019 2349  Weight: 123.4 kg   Examination: General:  Frail morbidly obese elderly female sitting upright in bed in NAD HEENT: MM pink/moist, very poor dentition  Neuro: Alert, oriented, appropriate, MAE generalized weakness CV: in/out NS to afib, rate controlled, +murmur PULM:  Non labored, mildly tachypenic, clear anteriorly, bibasilar soft rales GI: soft, bs, NT Extremities: warm/dry, +2-3 LE edema with areas of weeping wrapped in kerlix, some dependent edema in UE  Resolved Hospital Problem list    Assessment & Plan:   69 yoF with hx of severe pulmonary hypertension on ambrisentan with prior RSVP 85 and chronic RV failure, diastolic HF who suddenly went unresponsive at SNF.  CPR performed by staff for ~15 mins.  ROSC on EMS arrival.  Patient since neuro intact.  Events most likely consistent with probable syncopal event from chronic hypoxia/ severe PH/ RV failure/ acute on chronic diastolic HF.   Acute on chronic hypoxic respiratory failure  Severe pulmonary hypertension Chronic RV failure Acute on chronic diastolic HF OSA Hx COPD ( although never smoker, PFTs 2018 show restrictive disease) - wt is currently +13.4 Kg since June 2020 P:  ICU monitoring overnight Obtain baseline ABG Trend troponin/ EKG TTE in am  Will need HF consult in am  Supplemental O2 for sat goal 88-94% Prn albuterol  Continue home nebs  Hold diuresis for now given AKI, repeat BMP now Holding home spironolactone and torsemide    Acute on chronic CKD P:  purwick / strict I/Os  Repeat BMP/ Mag\ May need further workup/ renal US Trend BMP / urinary output Replace electrolytes as indicated Avoid nephrotoxic agents,  ensure adequate renal perfusion    R/o PE - O2 saturations satisfactory, low Wells score  P:  Continue empiric heparin for now BLE dopplers in am to r/o DVT, if negative, d/c heparin   PAF- intermittent NS/ afib here, rate controlled - on ASA only given prior severe epistaxis P:  ASA Continue home amiodarone    Possible UTI- ?contaminated - asymptomatic, afebrile and normal WBC - PCT slightly elevated covered with ceftriaxone x 1 at [facility] P:  Monitor clinically for now, follow PCT and UC Trend CBC  DMT2 P:  SSI / CBG q 4  Chronic anemia  P:  Trend CBC, monitor for bleeding while on heparin   Best practice:  Diet: NPO for now Pain/Anxiety/Delirium protocol (if indicated): n/a VAP protocol (if indicated): n/a DVT prophylaxis: heparin gtt GI prophylaxis: PPI Glucose control: SSI Mobility: BR for now, will need PT/ OT Code Status: Full, patient states she wants everything done till she is "brain dead".   Family Communication: NOK is sister.  Patient updated on plan of care Disposition: ICU   Labs  CBC: No results for input(s): WBC, NEUTROABS, HGB, HCT, MCV, PLT in the last 168 hours.  Basic Metabolic Panel: No results for input(s): NA, K, CL, CO2, GLUCOSE, BUN, CREATININE, CALCIUM, MG, PHOS in the last 168 hours. GFR: CrCl cannot be calculated (Patient's most recent lab result is older than the maximum 21 days allowed.). No results for input(s): PROCALCITON, WBC, LATICACIDVEN in the last 168 hours.  Liver Function Tests: No results for input(s): AST, ALT, ALKPHOS, BILITOT, PROT, ALBUMIN in the last 168 hours. No results for input(s): LIPASE, AMYLASE in the last 168 hours. No results for input(s): AMMONIA in the last 168 hours.  ABG    Component Value Date/Time   PHART 7.380 10/27/2018 2118   PCO2ART 47.5 10/27/2018 2118   PO2ART 79.2 (L) 10/27/2018 2118   HCO3 37.1 (H) 11/06/2018 0907   HCO3 37.7 (H) 11/06/2018 0907   TCO2 39 (H) 11/06/2018 0907   TCO2  40 (H) 11/06/2018 0907   O2SAT 67.0 11/06/2018 0907   O2SAT 69.0 11/06/2018 0907     Coagulation Profile: No results for input(s): INR, PROTIME in the last 168 hours.  Cardiac Enzymes: No results for input(s): CKTOTAL, CKMB, CKMBINDEX, TROPONINI in the last 168 hours.  HbA1C: No results found for: HGBA1C  CBG: Recent Labs  Lab 05/13/2019 2318  GLUCAP 126*    Review of Systems:   Review of Systems  Constitutional: Negative for chills and fever.  Respiratory: Positive for shortness of breath. Negative for cough, hemoptysis, sputum production and wheezing.   Cardiovascular: Positive for orthopnea and leg swelling. Negative for palpitations.  Gastrointestinal: Negative for abdominal pain, blood in stool, diarrhea, nausea and vomiting.  Genitourinary: Negative for dysuria, frequency and urgency.  Neurological: Positive for dizziness, loss of consciousness and weakness. Negative for focal weakness and seizures.   Past Medical History  She,  has a past medical history of Breast cancer ([place]), COPD (chronic obstructive pulmonary disease) ([place]), Diabetes mellitus type 2 in obese ([place]), HLD (hyperlipidemia), HTN (hypertension), and Morbid obesity ([place]).   Surgical History    Past Surgical History:  Procedure Laterality Date  . BREAST SURGERY    . HERNIA REPAIR    . RIGHT HEART CATH N/A 11/06/2018   Procedure: RIGHT HEART CATH;  Surgeon: Larey Dresser, MD;  Location: [place] [facility];  Service: Cardiovascular;  Laterality: N/A;     Social History   reports that she has never smoked. She has never used smokeless tobacco. She reports that she does not drink alcohol or use drugs.   Family History   Her family history includes Breast cancer in her mother; Colon cancer in her mother; Emphysema in her brother and father; Heart disease in her mother.   Allergies Allergies  Allergen Reactions  . Codeine Nausea And Vomiting and Other (See Comments)    Unknown   . Morphine And  Related Nausea And Vomiting     Home Medications  Prior to Admission medications   Medication Sig Start Date End Date Taking? Authorizing Provider  ACCU-CHEK AVIVA PLUS test strip  02/06/18   [provider]  acetaminophen (TYLENOL) 500 MG tablet Take 1,000 mg by mouth every 6 (six) hours as needed for mild pain.    [provider]  albuterol (PROVENTIL HFA;VENTOLIN HFA) 108 (90 Base) MCG/ACT inhaler Inhale 2 puffs into the lungs every 4 (four) hours as needed for wheezing or shortness of breath.     [provider]  ambrisentan (LETAIRIS) 10 MG tablet  Take 1 tablet (10 mg total) by mouth daily. 04/18/19   Larey Dresser, MD  amiodarone (PACERONE) 100 MG tablet Take 1 tablet (100 mg total) by mouth daily. 11/09/18 12/30/19  Donne Hazel, MD  anastrozole (ARIMIDEX) 1 MG tablet Take 1 mg by mouth daily.    [provider]  aspirin EC 81 MG tablet Take 81 mg by mouth daily.    [provider]  budesonide (PULMICORT) 0.5 MG/2ML nebulizer solution Inhale 2 mLs into the lungs every 6 (six) hours as needed for shortness of breath. 06/23/18   [provider]  budesonide-formoterol (SYMBICORT) 80-4.5 MCG/ACT inhaler Take 2 puffs first thing in am and then another 2 puffs about 12 hours later. 06/01/17   Tanda Rockers, MD  insulin aspart (NOVOLOG) 100 UNIT/ML injection Sliding scale for glucose:  CBG < 70: Implement Hypoglycemia Protocol  CBG 70 - 120: 0 units  CBG 121 - 150: 1 unit  CBG 151 - 200: 2 units  CBG 201 - 250: 3 units  CBG 251 - 300: 5 units  CBG 301 - 350: 7 units  CBG 351 - 400 9 units  CBG > 400 Notify facility MD 11/08/18   Donne Hazel, MD  Insulin Glargine (LANTUS SOLOSTAR) 100 UNIT/ML Solostar Pen Inject 8 Units into the skin at bedtime.    [provider]  ipratropium-albuterol (DUONEB) 0.5-2.5 (3) MG/3ML SOLN Take 3 mLs by nebulization every 4 (four) hours as needed.    [provider]  loperamide  (IMODIUM A-D) 2 MG tablet Take 2 mg by mouth 4 (four) times daily as needed for diarrhea or loose stools.    [provider]  loratadine (CLARITIN) 10 MG tablet Take 10 mg by mouth daily as needed for allergies.    [provider]  meclizine (ANTIVERT) 25 MG tablet Take 25 mg by mouth every 6 (six) hours as needed for dizziness.     [provider]  OXYGEN 3 L continuous. [place]    [provider]  torsemide (DEMADEX) 20 MG tablet Take 1 tablet (20 mg total) by mouth daily. Please cancel all previous orders for current medication. Change in dosage or pill size. 12/05/18 01/04/19  Conrad [place], NP     Critical care time: 74 mins      Kennieth Rad, MSN, AGACNP-BC [facility] 06/05/2019, 3:00 AM

## 2019-06-05 NOTE — Progress Notes (Signed)
Unable to complete orthostatic vitals, discussed with Chundi. Severe activity intolerance as evidenced by desating to 82% consistently with turns, face becomes purple, and she verbalizes panic to quickly resume supine position.

## 2019-06-05 NOTE — Progress Notes (Signed)
VASCULAR LAB PRELIMINARY  PRELIMINARY  PRELIMINARY  PRELIMINARY  Bilateral lower extremity venous duplex completed.    Preliminary report:  See CV proc for preliminary results.   Rosiland Sen, RVT 06/05/2019, 10:50 AM

## 2019-06-05 NOTE — Progress Notes (Signed)
[place] for Heparin  Indication: Rule out DVT/PE  Allergies  Allergen Reactions  . Codeine Nausea And Vomiting and Other (See Comments)    Unknown   . Morphine And Related Nausea And Vomiting    Patient Measurements: Height: _0  (157.5 cm) Weight: 272 lb 0.8 oz (123.4 kg) IBW/kg (Calculated) : 50.1  Vital Signs: Temp: 97.9 F (36.6 C) (01/28 1200) Temp Source: Oral (01/28 1200) BP: 128/65 (01/28 1300) Pulse Rate: 68 (01/28 1300)  Labs: Recent Labs    06/05/19 0216 06/05/19 0347 06/05/19 0552 06/05/19 1200  HGB 8.2*  --   --   --   HCT 27.6*  --   --   --   PLT 202  --   --   --   HEPARINUNFRC  --  <0.10*  --  0.23*  CREATININE 3.05*  --   --   --   TROPONINIHS  --  23* 29*  --     Estimated Creatinine Clearance: 21.5 mL/min (A) (by C-G formula based on SCr of 3.05 mg/dL (H)).   Medical History: Past Medical History:  Diagnosis Date  . Breast cancer ([place])   . COPD (chronic obstructive pulmonary disease) ([place])   . Diabetes mellitus type 2 in obese ([place])   . HLD (hyperlipidemia)   . HTN (hypertension)   . Morbid obesity [facility])      Assessment: 69 y/o F with syncopal episode at SNF, on empiric heparin for f/o DVT/PE, transferred from [place] on heparin at 1200 units/hr, Hgb 8.2  Heparin level this afternoon remains subtherapeutic, no infusion issues per RN.  Goal of Therapy:  Heparin level 0.3-0.7 units/ml Monitor platelets by anticoagulation protocol: Yes   Plan:  -Heparin 2000 units x1 then increase to 1600 units/h -Recheck heparin level in 8hr -Follow-up VTE workup   Arrie Senate, PharmD, BCPS Clinical Pharmacist [phone] Please check AMION for all [place] numbers 06/05/2019

## 2019-06-05 NOTE — Procedures (Signed)
Patient Name: Tammy Mathews  MRN: [id number]  Epilepsy Attending: Lora Havens  Referring Physician/Provider: Dr Lars Mage Date: 06/05/2019 Duration: 2  Patient history: 69 year old immunocompromise female with history of pulmonary hypertension who presented with transient loss of awareness thought initially to be in setting of acute on chronic hypoxic respiratory failure.  EEG to evaluate for seizures.  Level of alertness: awake  AEDs during EEG study: None  Technical aspects: This EEG study was done with scalp electrodes positioned according to the 10-20 International system of electrode placement. Electrical activity was acquired at a sampling rate of _0  and reviewed with a high frequency filter of _1  and a low frequency filter of _2 . EEG data were recorded continuously and digitally stored.   Description: During awake state, no clear posterior dominant rhythm was seen.  EEG showed continuous generalized 3 to 6 Hz theta-delta slowing as well as intermittent rhythmic generalized 2 to 3 Hz delta slowing.  Hyperventilation photic stimulation not performed due to COPD and patient unable to tolerate the study.  Of note, study was technically difficult due to significant myogenic artifact.  Abnormality -Continuous slow, generalized -Intermittent rhythmic slow, generalized  IMPRESSION: This technically difficult study is suggestive of mild to moderate diffuse encephalopathy, nonspecific etiology. No seizures or epileptiform discharges were seen throughout the recording.  Adalynd Donahoe Barbra Sarks

## 2019-06-05 NOTE — Progress Notes (Signed)
ANTICOAGULATION CONSULT NOTE - Initial Consult  Pharmacy Consult for Heparin  Indication: Rule out DVT/PE  Allergies  Allergen Reactions  . Codeine Nausea And Vomiting and Other (See Comments)    Unknown   . Morphine And Related Nausea And Vomiting    Patient Measurements: Height: _0  (157.5 cm) Weight: 272 lb 0.8 oz (123.4 kg) IBW/kg (Calculated) : 50.1  Vital Signs: Temp: 97.7 F (36.5 C) (01/28 0400) Temp Source: Oral (01/28 0400) BP: 98/58 (01/28 0400) Pulse Rate: 68 (01/28 0400)  Labs: Recent Labs    06/05/19 0216 06/05/19 0347  HGB 8.2*  --   HCT 27.6*  --   PLT 202  --   HEPARINUNFRC  --  <0.10*  CREATININE 3.05*  --     Estimated Creatinine Clearance: 21.5 mL/min (A) (by C-G formula based on SCr of 3.05 mg/dL (H)).   Medical History: Past Medical History:  Diagnosis Date  . Breast cancer ([place])   . COPD (chronic obstructive pulmonary disease) ([place])   . Diabetes mellitus type 2 in obese ([place])   . HLD (hyperlipidemia)   . HTN (hypertension)   . Morbid obesity [facility])      Assessment: 69 y/o F with syncopal episode at SNF, on empiric heparin for f/o DVT/PE, transferred from [place] on heparin at 1200 units/hr, Hgb 8.2  Goal of Therapy:  Heparin level 0.3-0.7 units/ml Monitor platelets by anticoagulation protocol: Yes   Plan:  Heparin 2000 units re-bol[employer] heparin to 1400 units/hr Re-check heparin level at 1300 F/U venous dopplers   Narda Bonds, PharmD, [place] Pharmacist Phone: [phone]

## 2019-06-05 NOTE — Progress Notes (Signed)
EEG complete - results pending.

## 2019-06-05 NOTE — Progress Notes (Signed)
[facility] Progress Note Patient Name: Tammy Mathews DOB: Jul 26, 1950 MRN: [id number]   Date of Service  06/05/2019  HPI/Events of Note  Pt needs sliding scale Humalog insulin coverage.  eICU Interventions  Sensitive Humalog Insulin  coverage scale ordered.        Kerry Kass Nery Kalisz 06/05/2019, 5:10 AM

## 2019-06-05 NOTE — Progress Notes (Signed)
PT Cancellation Note  Patient Details Name: Tammy Mathews MRN: [id number] DOB: [birth year]   Cancelled Treatment:    Reason Eval/Treat Not Completed: Medical issues which prohibited therapy   Chart reviewed and spoke with RN.  Pt's sats dropping to 83% just with turns, turning purple, and with difficulty breathing.  Will follow up as able.   Maggie Font, PT Acute Rehab Services Pager [phone] [facility] [phone] [facility] [phone]    Karlton Lemon 06/05/2019, 4:42 PM

## 2019-06-05 NOTE — Progress Notes (Signed)
PULMONARY / CRITICAL CARE MEDICINE   NAME:  Tammy Mathews, MRN:  [id number], DOB:  [birth year], LOS: 1 ADMISSION DATE:  05/19/2019, CONSULTATION DATE:  05/17/2019 REFERRING MD:  Oval Linsey ER, CHIEF COMPLAINT:  Unresponsive  BRIEF HISTORY:    Tammy Mathews is a 69 y.o immunocompromised female with pulmonary htn on ambrisentan, paf not on anticoagulation given prior severe epistaxis, breast cancer, osa, chronic rv failure, hfpef ef 60-65% per tte 6/20,ckd 3,  chronic hypoxic respiratory failure on o2 chronically who resides at [facility] and rehab in [place] presented for evaluation of what was thought to be a syncopal episode as the patient recalls chest compressions. Patient was noted to have had worsening dyspnea for the past 2-3 days, decreased ambulation, and with increased lower extremity swelling.   Patient being managed for syncopal episode that is thought to be in setting of acute on chronic hypoxic respiratory failure.   SIGNIFICANT PAST MEDICAL HISTORY   Has a past medical history of Breast cancer ([place]), COPD (chronic obstructive pulmonary disease) ([place]), Diabetes mellitus type 2 in obese ([place]), HLD (hyperlipidemia), HTN (hypertension), and Morbid obesity ([place]).  SIGNIFICANT EVENTS:  1/27 transferred from [place] to [facility]   STUDIES:   1/27 CT chest ([facility])>> Mild to moderate severity areas of atelectasis and/or infiltrate within the bilateral upper lobes and right lower lobe.  Small left pleural effusion.  Mild cardiomegaly.   CULTURES:  1/27 SARS 2, flu a, b negative  1/27 urine culture pending   ANTIBIOTICS:  Ceftriaxone 1/27>  LINES/TUBES:   Right antecubital peripheral IV 1/27 Right chest implanted port 6/30  CONSULTANTS:    SUBJECTIVE:  Patient states that she is doing well. According to nurse this morning the patient stated that she saw a woman standing in the corner of the room.    CONSTITUTIONAL: BP (!) 129/59   Pulse 69   Temp 97.7 F (36.5 C) (Oral)   Resp 18    Ht _0  (1.575 m)   Wt 123.4 kg   SpO2 94%   BMI 49.76 kg/m   I/O last 3 completed shifts: In: 135.7 [I.V.:99; IV Piggyback:36.7] Out: 50 [Urine:50]  PHYSICAL EXAM: General:  Appears well Neuro: Alert, oriented x3, able to respond to questions, moves all extremities  HEENT:  Cardiovascular: rrr, s1 and s2 audible Lungs:  Ctab, no rhonchi or rales, 5L via [place] Abdomen:  Soft, nondistended, nontender Musculoskeletal:  No peripheral edema Skin:  Warm extremities   ASSESSMENT AND PLAN    Transient loss of consciousness Unknown cause. Per admitting team thought to be syncope as the patient informed them that she could recall chest compressions, however with me shoe told me she was not aware of chest compressions.   Will get eeg to check for epileptiform activity. Patient is not on any centrally acting medication. No significant metabolic changes. Orthostatic vital signs to check for volume depletion. Possibly due to acute on chronic hypoxic respiratory failure given patient's history of severe pulmonary hypertension and hfpef.   Patient states that she had one prior episode of transient loss of consciousness when she was laughing too hard in November 2020.  -orthostatic vital signs  -EKG pending  -eeg  Acute on chronic hypoxic respiratory failure  Severe Pulmonary hypertension  There is a concern for possible acute PE as the patient has d dimer of 2615 in a morbidly obese patient. Was started empirically on heparin.  TTE showed ef 55-60%, no regional wall motion abnormalities, left and right atrium mildly dilated, mild  aortic valve sclerosis, severely elevated pulmonary artery systolic pressure, ivc dilated, right ventrile consistent with pulmonary hypertension.   Patient takes torsemide 63m qd at home.  -follow up dvt  -monitor oxygen needs  -consulted heart failure team for possible rhc/lhc for additional pulmonary hypertension therapies  -continue mometasone-formoterol 2  puffs bid    Acute on chronic kidney injury  Cr 3.05 up from baseline 1.1-1.6. Na 128, Mg 2.6   -holding spironolactone and torsemide  -continue amiodarone 1094mqd  -continue home aspirin 8146md  Diabetes Mellitus  -ssi  Best Practice / Goals of Care / Disposition.   DVT PROPHYLAXIS: heparin SUP: pantoprazole NUTRITION: npo till slp  MOBILITY: pt and ot  GOALS OF CARE: Full  FAMILY DISCUSSIONS: will update sister  DISPOSITION ICU  LABS  Glucose Recent Labs  Lab 06/07/2019 2318 06/05/19 0439  GLUCAP 126* 148*    BMET Recent Labs  Lab 06/05/19 0216  NA 128*  K 6.0*  CL 92*  CO2 25  BUN 83*  CREATININE 3.05*  GLUCOSE 146*    Liver Enzymes Recent Labs  Lab 06/05/19 0216  AST 47*  ALT 31  ALKPHOS 130*  BILITOT 1.3*  ALBUMIN 3.2*    Electrolytes Recent Labs  Lab 06/05/19 0216  CALCIUM 8.5*  MG 2.6*  PHOS 6.3*    CBC Recent Labs  Lab 06/05/19 0216  WBC 6.8  HGB 8.2*  HCT 27.6*  PLT 202    ABG Recent Labs  Lab 06/05/19 0323  PHART 7.334*  PCO2ART 49.4*  PO2ART 79.6*    Coag's No results for input(s): APTT, INR in the last 168 hours.  Sepsis Markers Recent Labs  Lab 06/05/19 0216  PROCALCITON 0.20    Cardiac Enzymes No results for input(s): TROPONINI, PROBNP in the last 168 hours.  REVIEW OF SYSTEMS:    Patient denies dyspnea, chest pain, nausea, vomiting, abdominal pain

## 2019-06-05 NOTE — Progress Notes (Signed)
Ekg completed and placed in paper chart.   Interpretation by machine: Wide QRS rhythm Nonspecific intraventricular block Abnormal ECG   Vent rate 69 bpm PR interval: unable to determine (pt tremors, best guess by rn is 0.20 seconds) QRS duration: 126 ms QT/QTc-Baz  446/477 ms

## 2019-06-05 NOTE — Progress Notes (Signed)
CRITICAL VALUE ALERT  Critical Value:  Na 128, K 6.0, Creatinine 3.05, BUN 83  Date & Time Notied:  06/05/19 0320  Provider Notified: MD Scatliffe  Orders Received/Actions taken: Awaiting new orders. Will continue to monitor.

## 2019-06-06 ENCOUNTER — Inpatient Hospital Stay (HOSPITAL_COMMUNITY): Payer: Medicare Other

## 2019-06-06 DIAGNOSIS — I272 Pulmonary hypertension, unspecified: Secondary | ICD-10-CM

## 2019-06-06 DIAGNOSIS — R55 Syncope and collapse: Secondary | ICD-10-CM

## 2019-06-06 DIAGNOSIS — I50810 Right heart failure, unspecified: Secondary | ICD-10-CM

## 2019-06-06 LAB — BASIC METABOLIC PANEL
Anion gap: 16 — ABNORMAL HIGH (ref 5–15)
Anion gap: 16 — ABNORMAL HIGH (ref 5–15)
Anion gap: 13 (ref 5–15)
BUN: 97 mg/dL — ABNORMAL HIGH (ref 8–23)
BUN: 104 mg/dL — ABNORMAL HIGH (ref 8–23)
BUN: 103 mg/dL — ABNORMAL HIGH (ref 8–23)
CO2: 25 mmol/L (ref 22–32)
CO2: 23 mmol/L (ref 22–32)
CO2: 26 mmol/L (ref 22–32)
Calcium: 8.4 mg/dL — ABNORMAL LOW (ref 8.9–10.3)
Calcium: 8.5 mg/dL — ABNORMAL LOW (ref 8.9–10.3)
Calcium: 8.3 mg/dL — ABNORMAL LOW (ref 8.9–10.3)
Chloride: 89 mmol/L — ABNORMAL LOW (ref 98–111)
Chloride: 88 mmol/L — ABNORMAL LOW (ref 98–111)
Chloride: 89 mmol/L — ABNORMAL LOW (ref 98–111)
Creatinine, Ser: 3.54 mg/dL — ABNORMAL HIGH (ref 0.44–1.00)
Creatinine, Ser: 3.82 mg/dL — ABNORMAL HIGH (ref 0.44–1.00)
Creatinine, Ser: 3.82 mg/dL — ABNORMAL HIGH (ref 0.44–1.00)
GFR calc Af Amer: 14 mL/min — ABNORMAL LOW (ref >60)
GFR calc Af Amer: 13 mL/min — ABNORMAL LOW (ref >60)
GFR calc Af Amer: 13 mL/min — ABNORMAL LOW (ref >60)
GFR calc non Af Amer: 12 mL/min — ABNORMAL LOW (ref >60)
GFR calc non Af Amer: 11 mL/min — ABNORMAL LOW (ref >60)
GFR calc non Af Amer: 11 mL/min — ABNORMAL LOW (ref >60)
Glucose, Bld: 159 mg/dL — ABNORMAL HIGH (ref 70–99)
Glucose, Bld: 107 mg/dL — ABNORMAL HIGH (ref 70–99)
Glucose, Bld: 113 mg/dL — ABNORMAL HIGH (ref 70–99)
Potassium: 5.9 mmol/L — ABNORMAL HIGH (ref 3.5–5.1)
Potassium: 6 mmol/L — ABNORMAL HIGH (ref 3.5–5.1)
Potassium: 5.7 mmol/L — ABNORMAL HIGH (ref 3.5–5.1)
Sodium: 130 mmol/L — ABNORMAL LOW (ref 135–145)
Sodium: 127 mmol/L — ABNORMAL LOW (ref 135–145)
Sodium: 128 mmol/L — ABNORMAL LOW (ref 135–145)

## 2019-06-06 LAB — CBC
HCT: 27.1 % — ABNORMAL LOW (ref 36.0–46.0)
Hemoglobin: 8.1 g/dL — ABNORMAL LOW (ref 12.0–15.0)
MCH: 24.5 pg — ABNORMAL LOW (ref 26.0–34.0)
MCHC: 29.9 g/dL — ABNORMAL LOW (ref 30.0–36.0)
MCV: 81.9 fL (ref 80.0–100.0)
Platelets: 222 10*3/uL (ref 150–400)
RBC: 3.31 MIL/uL — ABNORMAL LOW (ref 3.87–5.11)
RDW: 16 % — ABNORMAL HIGH (ref 11.5–15.5)
WBC: 9.2 10*3/uL (ref 4.0–10.5)
nRBC: 0 % (ref 0.0–0.2)

## 2019-06-06 LAB — SODIUM, URINE, RANDOM: Sodium, Ur: <10 mmol/L

## 2019-06-06 LAB — HEPARIN LEVEL (UNFRACTIONATED)
Heparin Unfractionated: 0.45 IU/mL (ref 0.30–0.70)
Heparin Unfractionated: 0.44 IU/mL (ref 0.30–0.70)

## 2019-06-06 LAB — COOXEMETRY PANEL
Carboxyhemoglobin: 1.5 % (ref 0.5–1.5)
Methemoglobin: 0.8 % (ref 0.0–1.5)
O2 Saturation: 89.3 %
Total hemoglobin: 7.6 g/dL — ABNORMAL LOW (ref 12.0–16.0)

## 2019-06-06 LAB — GLUCOSE, CAPILLARY
Glucose-Capillary: 146 mg/dL — ABNORMAL HIGH (ref 70–99)
Glucose-Capillary: 141 mg/dL — ABNORMAL HIGH (ref 70–99)
Glucose-Capillary: 135 mg/dL — ABNORMAL HIGH (ref 70–99)
Glucose-Capillary: 108 mg/dL — ABNORMAL HIGH (ref 70–99)
Glucose-Capillary: 142 mg/dL — ABNORMAL HIGH (ref 70–99)

## 2019-06-06 LAB — OSMOLALITY, URINE: Osmolality, Ur: 335 mOsm/kg (ref 300–900)

## 2019-06-06 MED ORDER — DICLOFENAC SODIUM 1 % EX GEL
2.0000 g | Freq: Four times a day (QID) | CUTANEOUS | Status: DC
  Administered 2019-06-06 – 2019-06-07 (×4): 2 g via TOPICAL
  Filled 2019-06-06: qty 100

## 2019-06-06 MED ORDER — LIDOCAINE 5 % EX PTCH
1.0000 | MEDICATED_PATCH | CUTANEOUS | Status: DC
  Administered 2019-06-06 – 2019-06-08 (×3): 1 via TRANSDERMAL
  Filled 2019-06-06 (×3): qty 1

## 2019-06-06 MED ORDER — SODIUM ZIRCONIUM CYCLOSILICATE 5 G PO PACK
5.0000 g | PACK | Freq: Once | ORAL | Status: AC
  Administered 2019-06-06: 5 g via ORAL
  Filled 2019-06-06: qty 1

## 2019-06-06 MED ORDER — LORAZEPAM 2 MG/ML IJ SOLN
0.5000 mg | Freq: Once | INTRAMUSCULAR | Status: AC
  Administered 2019-06-06: 0.5 mg via INTRAVENOUS
  Filled 2019-06-06: qty 1

## 2019-06-06 MED ORDER — POLYETHYLENE GLYCOL 3350 17 G PO PACK
17.0000 g | PACK | Freq: Once | ORAL | Status: AC
  Administered 2019-06-06: 17 g via ORAL
  Filled 2019-06-06: qty 1

## 2019-06-06 MED ORDER — INSULIN GLARGINE 100 UNIT/ML ~~LOC~~ SOLN
5.0000 [IU] | Freq: Every day | SUBCUTANEOUS | Status: DC
  Administered 2019-06-06 – 2019-06-08 (×3): 5 [IU] via SUBCUTANEOUS
  Filled 2019-06-06 (×3): qty 0.05

## 2019-06-06 MED ORDER — SODIUM ZIRCONIUM CYCLOSILICATE 10 G PO PACK
10.0000 g | PACK | Freq: Two times a day (BID) | ORAL | Status: DC
  Administered 2019-06-07 (×2): 10 g via ORAL
  Filled 2019-06-06 (×3): qty 1

## 2019-06-06 MED ORDER — FERROUS SULFATE 325 (65 FE) MG PO TABS
325.0000 mg | ORAL_TABLET | Freq: Every day | ORAL | Status: DC
  Administered 2019-06-07: 325 mg via ORAL
  Filled 2019-06-06: qty 1

## 2019-06-06 MED ORDER — FUROSEMIDE 10 MG/ML IJ SOLN
80.0000 mg | Freq: Two times a day (BID) | INTRAMUSCULAR | Status: DC
  Administered 2019-06-06: 80 mg via INTRAVENOUS
  Filled 2019-06-06: qty 8

## 2019-06-06 MED ORDER — FLUTICASONE PROPIONATE 50 MCG/ACT NA SUSP
2.0000 | Freq: Every day | NASAL | Status: DC
  Administered 2019-06-06 – 2019-06-07 (×2): 2 via NASAL
  Filled 2019-06-06: qty 16

## 2019-06-06 MED ORDER — BETHANECHOL CHLORIDE 5 MG PO TABS
5.0000 mg | ORAL_TABLET | Freq: Two times a day (BID) | ORAL | Status: DC
  Administered 2019-06-06 – 2019-06-07 (×3): 5 mg via ORAL
  Filled 2019-06-06 (×4): qty 1

## 2019-06-06 MED ORDER — VITAMIN D (ERGOCALCIFEROL) 1.25 MG (50000 UNIT) PO CAPS
50000.0000 [IU] | ORAL_CAPSULE | ORAL | Status: DC
  Administered 2019-06-07: 50000 [IU] via ORAL
  Filled 2019-06-06: qty 1

## 2019-06-06 MED ORDER — INSULIN GLARGINE 100 UNIT/ML ~~LOC~~ SOLN
5.0000 [IU] | Freq: Every day | SUBCUTANEOUS | Status: DC
  Filled 2019-06-06: qty 0.05

## 2019-06-06 MED ORDER — DOCUSATE SODIUM 50 MG/5ML PO LIQD: 100.0000 mg | Freq: Every day | ORAL | Status: DC

## 2019-06-06 MED ORDER — DOBUTAMINE IN D5W 4-5 MG/ML-% IV SOLN
2.5000 ug/kg/min | INTRAVENOUS | Status: DC
  Administered 2019-06-06: 2.5 ug/kg/min via INTRAVENOUS
  Filled 2019-06-06: qty 250

## 2019-06-06 MED ORDER — FUROSEMIDE 10 MG/ML IJ SOLN
25.0000 mg/h | INTRAVENOUS | Status: DC
  Administered 2019-06-06: 12 mg/h via INTRAVENOUS
  Administered 2019-06-07 – 2019-06-08 (×2): 20 mg/h via INTRAVENOUS
  Filled 2019-06-06: qty 21
  Filled 2019-06-06 (×4): qty 25

## 2019-06-06 MED ORDER — ONDANSETRON HCL 4 MG PO TABS
4.0000 mg | ORAL_TABLET | Freq: Four times a day (QID) | ORAL | Status: DC | PRN
  Administered 2019-06-06: 4 mg via ORAL
  Filled 2019-06-06: qty 1

## 2019-06-06 MED ORDER — SODIUM ZIRCONIUM CYCLOSILICATE 5 G PO PACK
5.0000 g | PACK | Freq: Once | ORAL | Status: DC
  Filled 2019-06-06: qty 1

## 2019-06-06 MED ORDER — MELATONIN 3 MG PO TABS
4.5000 mg | ORAL_TABLET | Freq: Every day | ORAL | Status: DC
  Administered 2019-06-06 – 2019-06-07 (×2): 4.5 mg via ORAL
  Filled 2019-06-06 (×2): qty 1.5

## 2019-06-06 MED ORDER — ACETAMINOPHEN 325 MG PO TABS
650.0000 mg | ORAL_TABLET | Freq: Four times a day (QID) | ORAL | Status: DC
  Administered 2019-06-06 – 2019-06-07 (×6): 650 mg via ORAL
  Filled 2019-06-06 (×7): qty 2

## 2019-06-06 MED ORDER — LACTATED RINGERS IV SOLN: INTRAVENOUS | Status: DC

## 2019-06-06 MED ORDER — POLYETHYLENE GLYCOL 3350 17 G PO PACK: 17.0000 g | PACK | Freq: Every day | ORAL | Status: DC | PRN

## 2019-06-06 MED ORDER — DOBUTAMINE IN D5W 4-5 MG/ML-% IV SOLN: 2.5000 ug/kg/min | INTRAVENOUS | Status: DC

## 2019-06-06 MED ORDER — FUROSEMIDE 10 MG/ML IJ SOLN: 120.0000 mg | Freq: Once | INTRAVENOUS | Status: DC

## 2019-06-06 MED ORDER — SENNA 8.6 MG PO TABS
1.0000 | ORAL_TABLET | Freq: Every day | ORAL | Status: DC
  Administered 2019-06-06 – 2019-06-07 (×2): 8.6 mg via ORAL
  Filled 2019-06-06 (×2): qty 1

## 2019-06-06 NOTE — Evaluation (Signed)
Occupational Therapy Evaluation Patient Details Name: Tammy Mathews MRN: [id number] DOB: March 30, 1951 Today's Date: 06/06/2019    History of Present Illness Patient is a 69 y/o female who presents with syncopal episode followed by brief CPR. Admitted with severe pulmonary HTN and acute on chronic hypoxic respiratory failure. Rule out PE. PMH includes breast ca in remission, morbid obesity, HTN, HLD, DM, COPD, CHF, CKD, PAF not on anticoagulation given prior severe epistaxis.   Clinical Impression   Pt is a resident of a SNF and walks with a RW and assistance short distances. She is able to self feed and groom and assisted for bathing and dressing. Pt is typically on 3L 02. Pt presents with decrease activity tolerance and anxiety with dyspnea. She was noted to desaturate to 83% on 4L with exertion, but rebound to 95% with rest and deep breathing. Pt with chest pain as a result of CPR. Will follow acutely.     Follow Up Recommendations  SNF;Supervision/Assistance - 24 hour    Equipment Recommendations  None recommended by OT    Recommendations for Other Services       Precautions / Restrictions Precautions Precautions: Fall Precaution Comments: watch 02 Restrictions Weight Bearing Restrictions: No      Mobility Bed Mobility Overal bed mobility: Needs Assistance Bed Mobility: Supine to Sit;Sit to Supine     Supine to sit: Min assist;HOB elevated Sit to supine: Mod assist   General bed mobility comments: pt moving quickly when performing supine to sit, HOB up, pulled up on therapist's hand, assist for LEs back into bed, +2 total assist to pull up in bed and quickly raise HOB as pt gets SOB, cues to avoid holding her breath with exertion  Transfers Overall transfer level: Needs assistance Equipment used: Rolling walker (2 wheeled) Transfers: Sit to/from Stand Sit to Stand: Min assist;+2 physical assistance         General transfer comment: Assist of 2 to stand from EOB with  use of RW.    Balance Overall balance assessment: Needs assistance Sitting-balance support: Feet supported;Bilateral upper extremity supported Sitting balance-Leahy Scale: Fair Sitting balance - Comments: Min guard for safety sitting EOB, flexed posture and leans R Postural control: Right lateral lean Standing balance support: During functional activity Standing balance-Leahy Scale: Poor Standing balance comment: Requires UE support for standing, encouragement to not immediately return to sitting                           ADL either performed or assessed with clinical judgement   ADL Overall ADL's : Needs assistance/impaired Eating/Feeding: Set up;Bed level   Grooming: Sitting;Minimal assistance           Upper Body Dressing : Moderate assistance;Bed level   Lower Body Dressing: Total assistance;Bed level       Toileting- Clothing Manipulation and Hygiene: Bed level;Total assistance;+2 for physical assistance       Functional mobility during ADLs: +2 for safety/equipment;Minimal assistance;Rolling walker       Vision Patient Visual Report: No change from baseline       Perception     Praxis      Pertinent Vitals/Pain Pain Assessment: Faces Faces Pain Scale: Hurts little more Pain Location: chest pain from CPR Pain Descriptors / Indicators: Discomfort Pain Intervention(s): Monitored during session;Repositioned     Hand Dominance Right   Extremity/Trunk Assessment Upper Extremity Assessment Upper Extremity Assessment: LUE deficits/detail LUE Deficits / Details: longstanding limitations from prior lymph  node removal LUE Coordination: decreased gross motor   Lower Extremity Assessment Lower Extremity Assessment: Defer to PT evaluation   Cervical / Trunk Assessment Cervical / Trunk Assessment: Other exceptions;Kyphotic Cervical / Trunk Exceptions: weakness, morbid obesity   Communication Communication Communication: No difficulties    Cognition Arousal/Alertness: Awake/alert Behavior During Therapy: Anxious Overall Cognitive Status: Within Functional Limits for tasks assessed                                 General Comments: becomes anxious with shortness of breath or when in supine with head down   General Comments  Sp02 ranged from 83-95% on 4L/min 02 [place]    Exercises     Shoulder Instructions      Home Living Family/patient expects to be discharged to:: Skilled nursing facility                                        Prior Functioning/Environment Level of Independence: Needs assistance  Gait / Transfers Assistance Needed: walks with a walker with assistance short distances ADL's / Homemaking Assistance Needed: assisted for bathing and dressing, can groom and feed herself, wears hospital gowns at SNF   Comments: likes to participate in bingo and crafts         OT Problem List: Decreased strength;Decreased activity tolerance;Impaired balance (sitting and/or standing);Decreased knowledge of use of DME or AE;Cardiopulmonary status limiting activity;Pain;Impaired UE functional use      OT Treatment/Interventions: Self-care/ADL training;DME and/or AE instruction;Therapeutic activities;Patient/family education;Balance training    OT Goals(Current goals can be found in the care plan section) Acute Rehab OT Goals Patient Stated Goal: be able to breathe OT Goal Formulation: With patient Time For Goal Achievement: 06/20/19 Potential to Achieve Goals: Good ADL Goals Pt Will Perform Grooming: with set-up;sitting Pt Will Perform Upper Body Bathing: with mod assist;sitting Pt Will Perform Upper Body Dressing: with min assist;sitting Pt Will Transfer to Toilet: with min assist;stand pivot transfer;bedside commode Additional ADL Goal #1: Pt will utilize pursed lip breathing and pacing techniques during ADL and mobility with minimal verbal cues.  OT Frequency: Min 2X/week   Barriers to  D/C:            Co-evaluation PT/OT/SLP Co-Evaluation/Treatment: Yes Reason for Co-Treatment: Complexity of the patient's impairments (multi-system involvement);For patient/therapist safety PT goals addressed during session: Mobility/safety with mobility OT goals addressed during session: Proper use of Adaptive equipment and DME      AM-PAC OT "6 Clicks" Daily Activity     Outcome Measure Help from another person eating meals?: None Help from another person taking care of personal grooming?: A Lot Help from another person toileting, which includes using toliet, bedpan, or urinal?: Total Help from another person bathing (including washing, rinsing, drying)?: A Lot Help from another person to put on and taking off regular upper body clothing?: A Little Help from another person to put on and taking off regular lower body clothing?: Total 6 Click Score: 13   End of Session Equipment Utilized During Treatment: Rolling walker;Oxygen Nurse Communication: Mobility status;Other (comment)(needs purewick replaced)  Activity Tolerance: Patient tolerated treatment well Patient left: in bed;with call bell/phone within reach;with nursing/sitter in room  OT Visit Diagnosis: Unsteadiness on feet (R26.81);Other abnormalities of gait and mobility (R26.89);Pain;Muscle weakness (generalized) (M62.81)  Time: 2883-3744 OT Time Calculation (min): 23 min Charges:  OT General Charges $OT Visit: 1 Visit OT Evaluation $OT Eval Moderate Complexity: 1 Mod  Nestor Lewandowsky, OTR/L Acute Rehabilitation Services Pager: [phone] Office: [phone]  Malka So 06/06/2019, 2:02 PM

## 2019-06-06 NOTE — Progress Notes (Signed)
Spoke to triad hospitalist to resume care on 1/30.   Lars Mage, MD Internal Medicine PGY3 06/06/2019, 12:25 PM

## 2019-06-06 NOTE — Progress Notes (Signed)
[facility] for Heparin  Indication: Rule out DVT/PE  Allergies  Allergen Reactions  . Codeine Nausea And Vomiting and Other (See Comments)    Unknown   . Morphine And Related Nausea And Vomiting    Patient Measurements: Height: _0  (157.5 cm) Weight: 272 lb 0.8 oz (123.4 kg) IBW/kg (Calculated) : 50.1  Vital Signs: Temp: 97.6 F (36.4 C) (01/29 0700) Temp Source: Axillary (01/29 0700)  Labs: Recent Labs    06/05/19 0216 06/05/19 0347 06/05/19 0347 06/05/19 0552 06/05/19 1200 06/06/19 0056 06/06/19 0351  HGB 8.2*  --   --   --   --  8.1*  --   HCT 27.6*  --   --   --   --  27.1*  --   PLT 202  --   --   --   --  222  --   HEPARINUNFRC  --  <0.10*   < >  --  0.23* 0.45 0.44  CREATININE 3.05*  --   --   --   --  3.54*  --   TROPONINIHS  --  23*  --  29*  --   --   --    < > = values in this interval not displayed.    Estimated Creatinine Clearance: 18.5 mL/min (A) (by C-G formula based on SCr of 3.54 mg/dL (H)).   Medical History: Past Medical History:  Diagnosis Date  . Breast cancer ([place])   . COPD (chronic obstructive pulmonary disease) ([place])   . Diabetes mellitus type 2 in obese ([place])   . HLD (hyperlipidemia)   . HTN (hypertension)   . Morbid obesity [facility])      Assessment: 69 y/o F with syncopal episode at SNF, on empiric heparin for f/o DVT/PE, transferred from [place] on heparin at 1200 units/hr, Hgb 8.2  Heparin level therapeutic, dopplers negative, continuing heparin today per CCM (unclear plan, ?VQ?).  Goal of Therapy:  Heparin level 0.3-0.7 units/ml Monitor platelets by anticoagulation protocol: Yes   Plan:  -Heparin 1600 units/h -Daily heparin level and CBC -Follow-up VTE workup   Arrie Senate, PharmD, BCPS Clinical Pharmacist [phone] Please check AMION for all [place] numbers 06/06/2019

## 2019-06-06 NOTE — Progress Notes (Addendum)
Patient with worsening respiratory distress and hypotension. Was given bipap, but patient refused to put on mask. Now requiring dobutamine for bp support. Will cancel transfer order. Patient to remain in ICU. PCCM to continue management.   Updated patient's sister [name] and brother Linna Hoff about patient's status per patient's request.   -stop IVF as patient is likely volume overloaded  -start iv lasix drip  -co-ox stat  -dobutatmine drip  -consulted nephrology for worsening renal failure -lokelma 67m bid  -follow up potassium and cr on bmp at 1900 06/06/19  VLars Mage MD Internal Medicine PGY3 06/06/2019, 5:57 PM

## 2019-06-06 NOTE — Progress Notes (Addendum)
MD notified:  Pt is c/o feeling SHOB, dizzy, & nauseous.  BP 120/65 HR 64 94% on 3.5L [place]; CBG 108. States 'has inner ear so dizziness is not new'; states SHOB not new; states 'can look at food and feel sick or feel sick from not eating'.   Pt unable to tolerate HOB <45 degrees w/o anxiety d/t breathing.    Legs cleansed, redressed, and wrapped.  Request made to MD for wound consult for BLE edema & weeping; pt endorses [facility] and wrapping at [facility].  ~1600 s/p bath, F/C insertion, and repositioning; pt became somnolent, with fixed upward left gaze and agonal breaths - no change in cardiac rhythm, sats maintained lower 90's.  HOB elevated to 90 deg, MD alerted, BiPap initiated.  Pt quickly reoriented and returned to baseline respiratory status.  Code cart at bedside.  New orders received for lasix gtt, Bipap PRN, dobutamine.

## 2019-06-06 NOTE — Progress Notes (Addendum)
PULMONARY / CRITICAL CARE MEDICINE   NAME:  Tammy Mathews, MRN:  [id number], DOB:  1951/01/12, LOS: 2 ADMISSION DATE:  06/07/2019, CONSULTATION DATE:  06/05/2019 REFERRING MD:  [facility], CHIEF COMPLAINT:  Unresponsive  BRIEF HISTORY:    Tammy Mathews is a 69 y.o immunocompromised female with pulmonary htn on ambrisentan, paf not on anticoagulation given prior severe epistaxis, breast cancer, osa, chronic rv failure, hfpef ef 60-65% per tte 6/20,ckd 3,  chronic hypoxic respiratory failure on o2 chronically who resides at [facility] and rehab in [place] presented for evaluation of what was thought to be a syncopal episode as the patient recalls chest compressions. Patient was noted to have had worsening dyspnea for the past 2-3 days, decreased ambulation, and with increased lower extremity swelling.   Patient being managed for syncopal episode that is thought to be in setting of acute on chronic hypoxic respiratory failure.   SIGNIFICANT PAST MEDICAL HISTORY   Has a past medical history of Breast cancer ([place]), COPD (chronic obstructive pulmonary disease) ([place]), Diabetes mellitus type 2 in obese ([place]), HLD (hyperlipidemia), HTN (hypertension), and Morbid obesity ([place]).  SIGNIFICANT EVENTS:  1/27 transferred from [place] to [facility] 1/29 patient transferred to floor bed  STUDIES:   1/27 CT chest ([facility])>> Mild to moderate severity areas of atelectasis and/or infiltrate within the bilateral upper lobes and right lower lobe.  Small left pleural effusion.  Mild cardiomegaly.   CULTURES:  1/27 SARS 2, flu a, b negative  1/27 urine culture pending   ANTIBIOTICS:  Ceftriaxone 1/27>1/27  LINES/TUBES:   Right antecubital peripheral IV 1/27 Right chest implanted port 6/30  CONSULTANTS:   Heart failure eam  SUBJECTIVE:  Patient states that she is having constant chest pain that feels like soreness. She is easily getting dyspneic when she moves. She has not had bowel movement in 1 day.    CONSTITUTIONAL: BP (!) 118/56   Pulse 74   Temp 97.8 F (36.6 C) (Axillary)   Resp (!) 27   Ht _0  (1.575 m)   Wt 123.4 kg   SpO2 94%   BMI 49.76 kg/m   I/O last 3 completed shifts: In: 818.2 [P.O.:480; I.V.:301.6; IV Piggyback:36.7] Out: 250 [Urine:250]  PHYSICAL EXAM: General: appears in distress due to pain  Neuro: alert, oriented x3, able to move all extremities  HEENT: [place] intact Cardiovascular: bruised area on left upper chest, s1 and s2 audible  Lungs: On 5L [place], ctab, no rales or rhonchi Abdomen: soft, nondistended Musculoskeletal:  No pitting edema Skin: appears dry  ASSESSMENT AND PLAN    Transient loss of consciousness Thought to be syncope secondary to severe hypoxic respiratory failure. Ekg without ischemic changes and no significant troponin elevation. Concern for possible pe so heparin was started empirically. No imaging was done. However, due to low pretest probability, will stop.  EEG showed generalized intermittent rhythmic slowing and generalized continuous slow suggestive of mild to moderate diffuse encephalpathy without seizure or epileptiform activity. Orthostatic vital signs unable to be performed as patient desaturates when she is moved.   -stop heparin -SLP recommended regular thin liquid diet- hh diet thin  Acute on chronic hypoxic respiratory failure  Severe Pulmonary hypertension  Patient was started on empiric heparin due to concern for pe. DVT study showed no changes from previous exam and no dvts in a limited examination ultrasound.   Patient desaturates to 82% when she turns. Patient will likely need optimization of her pulmonary hypertension medication. TTE showed ef 55-60%, no regional  wall motion abnormalities, left and right atrium mildly dilated, mild aortic valve sclerosis, severely elevated pulmonary artery systolic pressure, ivc dilated, right ventrile consistent with pulmonary hypertension.   -lower extremity dopplers negative for  dvt -consider v/q scan -monitor oxygen needs  -consulted heart failure team for possible rhc/lhc for additional pulmonary hypertension therapies  -continue ambrisentan 93m qd -continue mometasone-formoterol 2 puffs bid   Chest wall tenderness  Patient states that she has constant chest wall pain that is worsened with deep breath. She has a bruised area in left upper chest subsequent to chest compressions. EKG 1/29 without ischemic changes   -chest xray  -incentive spirometry to prevent shallow respirations  -heating pad -voltaren gel  -scheduled tylenol   Acute on chronic kidney injury  Patient's creatinine continues to worsen from 3.05 to 3.54. Thought to be She has accompanied hyponatremia 130 and hyperkalemia 5.9.  -urine na and osmolality to calculate fena -giving lactated ringers at 107mhr for 10 hrs -repeat bmp at 1400 -will call nephrology if does not improve -lokelma 5g  -will start on maintenance fluids  -get repeat bmp  -holding spironolactone and torsemide  -continue amiodarone 10068md  -continue home aspirin 44m98m  Atrial fibrillation  Patient is on amiodarone 200mg13mwhich is being continued. The patient is only on asa for anticoagulation as had severe epistaxis episode.   -request cardiology recs on whether to restart noac.   Diabetes Mellitus Blood glucose ranging 150-200.   -started on lantus 5u qd -ssi  Best Practice / Goals of Care / Disposition.   DVT PROPHYLAXIS: heparin SUP: pantoprazole NUTRITION: npo till slp  MOBILITY: pt and ot  GOALS OF CARE: Full  FAMILY DISCUSSIONS: will update sister  DISPOSITION ICU  LABS  Glucose Recent Labs  Lab 06/05/19 0849 06/05/19 1332 06/05/19 1626 06/05/19 1959 06/05/19 2350 06/06/19 0341  GLUCAP 154* 125* 146* 199* 147* 146*    BMET Recent Labs  Lab 06/05/19 0216 06/06/19 0056  NA 128* 130*  K 6.0* 5.9*  CL 92* 89*  CO2 25 25  BUN 83* 97*  CREATININE 3.05* 3.54*  GLUCOSE 146* 159*     Liver Enzymes Recent Labs  Lab 06/05/19 0216  AST 47*  ALT 31  ALKPHOS 130*  BILITOT 1.3*  ALBUMIN 3.2*    Electrolytes Recent Labs  Lab 06/05/19 0216 06/06/19 0056  CALCIUM 8.5* 8.4*  MG 2.6*  --   PHOS 6.3*  --     CBC Recent Labs  Lab 06/05/19 0216 06/06/19 0056  WBC 6.8 9.2  HGB 8.2* 8.1*  HCT 27.6* 27.1*  PLT 202 222    ABG Recent Labs  Lab 06/05/19 0323  PHART 7.334*  PCO2ART 49.4*  PO2ART 79.6*    Coag's No results for input(s): APTT, INR in the last 168 hours.  Sepsis Markers Recent Labs  Lab 06/05/19 0216  PROCALCITON 0.20    Cardiac Enzymes No results for input(s): TROPONINI, PROBNP in the last 168 hours.  REVIEW OF SYSTEMS:    No nausea, vomiting, dyspnea  Has chest soreness, headache

## 2019-06-06 NOTE — Progress Notes (Signed)
Came to room d/t hearing bipap/vent beeping (indicating that machine was turned off).  Per RN, pt was pulling at mask and wanting mask off.  RN took off bipap and placed on [place], sat currently 91%, no distress currently noted.

## 2019-06-06 NOTE — Progress Notes (Signed)
[place] for Heparin  Indication: R/O PE  Allergies  Allergen Reactions  . Codeine Nausea And Vomiting and Other (See Comments)    Unknown   . Morphine And Related Nausea And Vomiting    Patient Measurements: Height: _0  (157.5 cm) Weight: 272 lb 0.8 oz (123.4 kg) IBW/kg (Calculated) : 50.1  Vital Signs: Temp: 97.7 F (36.5 C) (01/28 2352) Temp Source: Oral (01/28 2352) BP: 118/56 (01/28 1705) Pulse Rate: 74 (01/28 1705)  Labs: Recent Labs    06/05/19 0216 06/05/19 0347 06/05/19 0552 06/05/19 1200 06/06/19 0056  HGB 8.2*  --   --   --  8.1*  HCT 27.6*  --   --   --  27.1*  PLT 202  --   --   --  222  HEPARINUNFRC  --  <0.10*  --  0.23* 0.45  CREATININE 3.05*  --   --   --   --   TROPONINIHS  --  23* 29*  --   --     Estimated Creatinine Clearance: 21.5 mL/min (A) (by C-G formula based on SCr of 3.05 mg/dL (H)).   Assessment: 69 y.o. female with possible PE for heparin.  Goal of Therapy:  Heparin level 0.3-0.7 units/ml Monitor platelets by anticoagulation protocol: Yes   Plan:  Continue Heparin at current rate  Phillis Knack, PharmD, BCPS  06/06/2019

## 2019-06-06 NOTE — Evaluation (Signed)
Physical Therapy Evaluation Patient Details Name: Tammy Mathews MRN: [id number] DOB: 08/27/1950 Today's Date: 06/06/2019   History of Present Illness  Patient is a 69 y/o female who presents with syncopal episode followed by brief CPR. Admitted with severe pulmonary HTN and acute on chronic hypoxic respiratory failure. Rule out PE. PMH includes breast ca in remission, morbid obesity, HTN, HLD, DM, COPD, CHF, CKD, PAF not on anticoagulation given prior severe epistaxis.  Clinical Impression  Patient presents with dyspnea at rest-worsened with exertion, decreased activity tolerance, generalized weakness and impaired mobility s/p above. Pt is from [place] and reports walking short distances with use of RW and help PTA. Pt is usually on 2-3 L/min 02 [place] at baseline but requiring 4L/min 02 [place] here with Sp02 ranging from 83-95% during activity. Pt tends to hold breath during transitions. Tolerated bed mobility, transfers and side steps along side bed with min A for support. Not able to lay flat and becomes easily anxious with breathing. Will follow acutely to maximize independence and mobility prior to return to SNF.     Follow Up Recommendations SNF;Supervision for mobility/OOB    Equipment Recommendations  None recommended by PT    Recommendations for Other Services       Precautions / Restrictions Precautions Precautions: Fall Precaution Comments: watch 02 Restrictions Weight Bearing Restrictions: No      Mobility  Bed Mobility Overal bed mobility: Needs Assistance Bed Mobility: Supine to Sit;Sit to Supine     Supine to sit: Min assist;HOB elevated Sit to supine: Mod assist   General bed mobility comments: Impulsive getting to EOB, Min A for trunk with pt pulling up on therapist's hand. Holding breath during transition. ASsist to bring LEs into bed to return to supine.  Transfers Overall transfer level: Needs assistance Equipment used: Rolling walker (2  wheeled) Transfers: Sit to/from Stand Sit to Stand: Min assist;+2 physical assistance         General transfer comment: Assist of 2 to stand from EOB with use of RW.  Ambulation/Gait Ambulation/Gait assistance: Min guard Gait Distance (Feet): 4 Feet Assistive device: Rolling walker (2 wheeled) Gait Pattern/deviations: Wide base of support;Step-to pattern Gait velocity: decreased   General Gait Details: Able to side step along side bed with close Min guard assist. 3/4 DOE. Sp02 dropped to mid 80s on 4L/min 02  once returning to supine but stayed >90% with activity. Cues for pursed lip breathing.  Stairs            Wheelchair Mobility    Modified Rankin (Stroke Patients Only)       Balance Overall balance assessment: Needs assistance Sitting-balance support: Feet supported;Bilateral upper extremity supported Sitting balance-Leahy Scale: Fair Sitting balance - Comments: Min guard for safety sitting EOB.   Standing balance support: During functional activity Standing balance-Leahy Scale: Poor Standing balance comment: Requires Ue support for standing.                             Pertinent Vitals/Pain Pain Assessment: No/denies pain Faces Pain Scale: Hurts little more Pain Location: nausea Pain Descriptors / Indicators: Discomfort Pain Intervention(s): Limited activity within patient's tolerance;Monitored during session;Repositioned    Home Living Family/patient expects to be discharged to:: Skilled nursing facility([facility] and [facility])                      Prior Function Level of Independence: Needs assistance   Gait /  Transfers Assistance Needed: walks with a walker with assistance short distances  ADL's / Homemaking Assistance Needed: assisted for bathing and dressing, can groom and feed herself, wears hospital gowns at SNF  Comments: likes to participate in bingo and craft      Hand Dominance   Dominant Hand: Right     Extremity/Trunk Assessment   Upper Extremity Assessment Upper Extremity Assessment: Defer to OT evaluation    Lower Extremity Assessment Lower Extremity Assessment: Generalized weakness(but functional. Wounds/blisters noted in distal BLEs- currently wrapped.)       Communication   Communication: No difficulties  Cognition Arousal/Alertness: Awake/alert Behavior During Therapy: WFL for tasks assessed/performed Overall Cognitive Status: Within Functional Limits for tasks assessed                                 General Comments: Difficulty talking at times due to dyspnea      General Comments General comments (skin integrity, edema, etc.): Sp02 ranged from 83-95% on 4L/min 02 [place]    Exercises     Assessment/Plan    PT Assessment Patient needs continued PT services  PT Problem List Decreased strength;Decreased mobility;Obesity;Cardiopulmonary status limiting activity;Decreased skin integrity;Decreased balance;Decreased activity tolerance       PT Treatment Interventions Therapeutic activities;Gait training;Therapeutic exercise;Patient/family education;Balance training;Functional mobility training    PT Goals (Current goals can be found in the Care Plan section)  Acute Rehab PT Goals Patient Stated Goal: be able to breathe PT Goal Formulation: With patient Time For Goal Achievement: 06/20/19 Potential to Achieve Goals: Good    Frequency Min 2X/week   Barriers to discharge        Co-evaluation PT/OT/SLP Co-Evaluation/Treatment: Yes Reason for Co-Treatment: Complexity of the patient's impairments (multi-system involvement);For patient/therapist safety;To address functional/ADL transfers PT goals addressed during session: Mobility/safety with mobility         AM-PAC PT "6 Clicks" Mobility  Outcome Measure Help needed turning from your back to your side while in a flat bed without using bedrails?: A Little Help needed moving from lying on your back to  sitting on the side of a flat bed without using bedrails?: A Little Help needed moving to and from a bed to a chair (including a wheelchair)?: A Lot Help needed standing up from a chair using your arms (e.g., wheelchair or bedside chair)?: A Lot Help needed to walk in hospital room?: A Little Help needed climbing 3-5 steps with a railing? : A Lot 6 Click Score: 15    End of Session Equipment Utilized During Treatment: Oxygen Activity Tolerance: Patient tolerated treatment well Patient left: in bed;with call bell/phone within reach;with nursing/sitter in room Nurse Communication: Mobility status PT Visit Diagnosis: Muscle weakness (generalized) (M62.81);Difficulty in walking, not elsewhere classified (R26.2)    Time: 3692-2300 PT Time Calculation (min) (ACUTE ONLY): 21 min   Charges:   PT Evaluation $PT Eval Moderate Complexity: 1 Mod          Marisa Severin, PT, DPT Acute Rehabilitation Services Pager [phone] Office [phone]      Tammy Mathews 06/06/2019, 1:23 PM

## 2019-06-06 NOTE — Consult Note (Addendum)
Advanced Heart Failure Team Consult Note   Primary Physician: Greig Right, MD PCP-Cardiologist:  No primary care provider on file.  Reason for Consultation: Pulmonary Hypertension   HPI:    Tammy Mathews is seen today for evaluation of pulmonary hypertension at the request of Dr   Patient is a 69 year old with a history of breast cancer, obesity, HTN, PAF,  hyperlipidemia,"COPD" but never smoked,chronic respiratory failure on2liters oxygen,CKDstage 3, OSA intolerant CPAP, and PAH.    [facility] function and increased shortness of breath. She had a 50 pound weight 4 weeks prior to admit.At [facility] she was placed on IV diuretic with poor response and renal function worsened.Transferred to [facility]. HF team consulted. Diuresed with IV lasix+ diamox and later had RHCshowing severe PAH with filling pressures still elevated.Eventually discharged to SNF. Discharge weight was 242 pounds.She remains in the SNF in [place].   [place] work up listed below.  6/20 echo with evidence for RV dysfunction as above.  - RHC (7/20): mean RA 14, PA 80/34 mean 53, PCWP 21, CI 3.8, PVR 4 WU.  -Pulmonary hypertension serologic workup: ANA, RF, anti-SCL70, anti-centromere, ANCA all negative. - V/Q scan (6/20): No evidence for chronic PE.  - High resolution CT chest (6/20): No evidence for ILD.   In November she was unable to o take opsumit because it is nonpreferred by Medicaid so she was placed on letaris 10 mg.   Presented to [facility] from SNF after she was found unresponsive. CPR was perfomed ~15 minutes with ROSC . Says she doesn't know what happened. Placed on heparin drip due to concern for PE. Unable to obtain CTA due to elevated creatinine.   Transferred to [facility] for [facility] management.CXR with porta cath over SVC and low lung volumes. Pertinent admission labs included - SARS2 negative, creatinine 3.05, K 6, mag 2.6, hgb 8.2, WBC  6.8, procalcitonin 0.20, HS Trop 23>29. EKG today SR 65 bpm.   Complaining of pain from compressions and shortness of breath with movement.     ECHO 06/05/19 EF 55-60% PA 80  Echo 10/2018 EF 60-65%  RV moderately reduced. PA 84  Review of Systems: [y] = yes, _0  = no   . General: Weight gain _1 ; Weight loss _2 ; Anorexia _3 ; Fatigue [ Y]; Fever _4 ; Chills _5 ; Weakness _6   . Cardiac: Chest pain/pressure _7 ; Resting SOB _8 ; Exertional SOB [Y ]; Orthopnea [Y ]; Pedal Edema [Y ]; Palpitations _9 ; Syncope _10 ; Presyncope _11 ; Paroxysmal nocturnal dyspnea_12   . Pulmonary: Cough _13 ; Wheezing_14 ; Hemoptysis_15 ; Sputum _16 ; Snoring _17   . GI: Vomiting_18 ; Dysphagia_19 ; Melena_20 ; Hematochezia _21 ; Heartburn_22 ; Abdominal pain _23 ; Constipation _24 ; Diarrhea _25 ; BRBPR _26   . GU: Hematuria_27 ; Dysuria _28 ; Nocturia_29   . Vascular: Pain in legs with walking _30 ; Pain in feet with lying flat _31 ; Non-healing sores _32 ; Stroke _33 ; TIA _34 ; Slurred speech _35 ;  . Neuro: Headaches_36 ; Vertigo_37 ; Seizures_38 ; Paresthesias_39 ;Blurred vision _40 ; Diplopia _41 ; Vision changes _42   . Ortho/Skin: Arthritis _43 ; Joint pain [Y ]; Muscle pain _44 ; Joint swelling _45 ; Back Pain [Y ]; Rash _46   . Psych: Depression_47 ; Anxiety_48   . Heme: Bleeding problems _49 ; Clotting disorders _50 ; Anemia [ Y]  .  Endocrine: Diabetes _0 ; Thyroid dysfunction_1   Home Medications Prior to Admission medications   Medication Sig Start Date End Date Taking? Authorizing Provider  acetaminophen (TYLENOL) 500 MG tablet Take 1,000 mg by mouth every 8 (eight) hours as needed for mild pain.    Yes [provider]  albuterol (PROVENTIL HFA;VENTOLIN HFA) 108 (90 Base) MCG/ACT inhaler Inhale 2 puffs into the lungs every 4 (four) hours as needed for wheezing or shortness of breath.    Yes [provider]  ambrisentan (LETAIRIS) 10 MG tablet Take 1 tablet (10 mg total) by mouth daily. 04/18/19  Yes Larey Dresser,  MD  amiodarone (PACERONE) 200 MG tablet Take 200 mg by mouth daily. 05/29/19  Yes [provider]  anastrozole (ARIMIDEX) 1 MG tablet Take 1 mg by mouth daily.   Yes [provider]  aspirin EC 81 MG tablet Take 81 mg by mouth daily.   Yes [provider]  bethanechol (URECHOLINE) 5 MG tablet Take 5 mg by mouth 2 (two) times daily. 05/30/19  Yes [provider]  budesonide (PULMICORT) 0.5 MG/2ML nebulizer solution Inhale 2 mLs into the lungs every 6 (six) hours as needed for shortness of breath. 06/23/18  Yes [provider]  budesonide-formoterol (SYMBICORT) 80-4.5 MCG/ACT inhaler Take 2 puffs first thing in am and then another 2 puffs about 12 hours later. 06/01/17  Yes Tanda Rockers, MD  cefTRIAXone (ROCEPHIN) 1 g injection Inject 1 g into the muscle daily. For 5 Days 05/09/2019  Yes [provider]  docusate sodium (COLACE) 100 MG capsule Take 100 mg by mouth at bedtime.   Yes [provider]  ferrous sulfate 325 (65 FE) MG tablet Take 325 mg by mouth daily with breakfast.   Yes [provider]  fluticasone (FLONASE) 50 MCG/ACT nasal spray Place 2 sprays into both nostrils daily. 05/23/19  Yes [provider]  guaifenesin (HUMIBID E) 400 MG TABS tablet Take 400 mg by mouth 2 (two) times daily. For 14 days 05/09/2019  Yes [provider]  ipratropium-albuterol (DUONEB) 0.5-2.5 (3) MG/3ML SOLN Take 3 mLs by nebulization every 6 (six) hours. X 5 days 05/24/2019  Yes [provider]  ipratropium-albuterol (DUONEB) 0.5-2.5 (3) MG/3ML SOLN Take 3 mLs by nebulization every 4 (four) hours as needed (shortness of breath).   Yes [provider]  LEVEMIR FLEXTOUCH 100 UNIT/ML Pen Inject 26 Units into the skin at bedtime. 05/19/19  Yes [provider]  loperamide (IMODIUM A-D) 2 MG tablet Take 2 mg by mouth 4 (four) times daily as needed for diarrhea or loose stools.   Yes [provider]    loratadine (CLARITIN) 10 MG tablet Take 10 mg by mouth daily as needed for allergies.   Yes [provider]  meclizine (ANTIVERT) 25 MG tablet Take 25 mg by mouth every 6 (six) hours as needed for dizziness.    Yes [provider]  Melatonin 5 MG TABS Take 5 mg by mouth at bedtime.   Yes [provider]  Menthol, Topical Analgesic, (BIOFREEZE) 4 % GEL Apply 1 application topically 2 (two) times daily. For left shoulder pain   Yes [provider]  Morphine Sulfate (MORPHINE CONCENTRATE) 10 mg / 0.5 ml concentrated solution Take 0.25 mLs by mouth every 2 (two) hours as needed for shortness of breath. 05/29/19  Yes [provider]  nitrofurantoin, macrocrystal-monohydrate, (MACROBID) 100 MG capsule Take 100 mg by mouth 2 (two) times daily. 06/03/19  Yes  [provider]  ondansetron (ZOFRAN) 4 MG tablet Take 4 mg by mouth every 6 (six) hours as needed for nausea or vomiting.   Yes [provider]  OXYGEN 3 L continuous. AHC   Yes [provider]  potassium chloride SA (KLOR-CON) 20 MEQ tablet Take 20 mEq by mouth daily. 05/23/19  Yes [provider]  senna (SENOKOT) 8.6 MG TABS tablet Take 1 tablet by mouth every 12 (twelve) hours as needed for mild constipation.   Yes [provider]  spironolactone (ALDACTONE) 25 MG tablet Take 25 mg by mouth daily. X 7 days 05/13/2019  Yes [provider]  tamsulosin (FLOMAX) 0.4 MG CAPS capsule Take 0.4 mg by mouth daily. 05/27/19  Yes [provider]  torsemide (DEMADEX) 20 MG tablet Take 1 tablet (20 mg total) by mouth daily. Please cancel all previous orders for current medication. Change in dosage or pill size. Patient taking differently: Take 20 mg by mouth 2 (two) times daily.  12/05/18 06/06/19 Yes Clegg, Amy D, NP  Vitamin D, Ergocalciferol, (DRISDOL) 1.25 MG (50000 UNIT) CAPS capsule Take 50,000 Units by mouth every 7 (seven) days. Thursday   Yes [provider]  ACCU-CHEK AVIVA PLUS test strip  02/06/18   [provider]  insulin aspart (NOVOLOG) 100 UNIT/ML injection Sliding scale for glucose:  CBG < 70: Implement Hypoglycemia Protocol  CBG 70 - 120: 0 units  CBG 121 - 150: 1 unit  CBG 151 - 200: 2 units  CBG 201 - 250: 3 units  CBG 251 - 300: 5 units  CBG 301 - 350: 7 units  CBG 351 - 400 9 units  CBG > 400 Notify facility MD Patient not taking: Reported on 06/06/2019 11/08/18   Donne Hazel, MD  Insulin Glargine (LANTUS SOLOSTAR) 100 UNIT/ML Solostar Pen Inject 8 Units into the skin at bedtime.    [provider]    Past Medical History: Past Medical History:  Diagnosis Date  . Breast cancer ([place])   . COPD (chronic obstructive pulmonary disease) ([place])   . Diabetes mellitus type 2 in obese ([place])   . HLD (hyperlipidemia)   . HTN (hypertension)   . Morbid obesity ([place])     Past Surgical History: Past Surgical History:  Procedure Laterality Date  . BREAST SURGERY    . HERNIA REPAIR    . RIGHT HEART CATH N/A 11/06/2018   Procedure: RIGHT HEART CATH;  Surgeon: Larey Dresser, MD;  Location: [facility];  Service: Cardiovascular;  Laterality: N/A;    Family History: Family History  Problem Relation Age of Onset  . Heart disease Mother   . Colon cancer Mother   . Breast cancer Mother   . Emphysema Father        smoked  . Emphysema Brother        smoked    Social History: Social History   Socioeconomic History  . Marital status: Single    Spouse name: Not on file  . Number of children: Not on file  . Years of education: Not on file  . Highest education level: Not on file  Occupational History  . Not on file  Tobacco Use  . Smoking status: Never Smoker  . Smokeless tobacco: Never Used  Substance and Sexual Activity  . Alcohol use: Never  . Drug use: Never  . Sexual activity: Not on file  Other Topics Concern  . Not on file  Social History Narrative  .  Not on file    Social Determinants of Health   Financial Resource Strain:   . Difficulty of Paying Living Expenses: Not on file  Food Insecurity:   . Worried About Charity fundraiser in the Last Year: Not on file  . Ran Out of Food in the Last Year: Not on file  Transportation Needs:   . Lack of Transportation (Medical): Not on file  . Lack of Transportation (Non-Medical): Not on file  Physical Activity:   . Days of Exercise per Week: Not on file  . Minutes of Exercise per Session: Not on file  Stress:   . Feeling of Stress : Not on file  Social Connections:   . Frequency of Communication with Friends and Family: Not on file  . Frequency of Social Gatherings with Friends and Family: Not on file  . Attends Religious Services: Not on file  . Active Member of Clubs or Organizations: Not on file  . Attends Archivist Meetings: Not on file  . Marital Status: Not on file    Allergies:  Allergies  Allergen Reactions  . Codeine Nausea And Vomiting and Other (See Comments)    Unknown   . Morphine And Related Nausea And Vomiting    Objective:    Vital Signs:   Temp:  [97.6 F (36.4 C)-97.8 F (36.6 C)] 97.6 F (36.4 C) (01/29 1100) Pulse Rate:  [62-74] 62 (01/29 1000) Resp:  [20-30] 20 (01/29 1000) BP: (93-134)/(37-73) 93/58 (01/29 1000) SpO2:  [82 %-99 %] 97 % (01/29 1000) Last BM Date: 05/20/2019  Weight change: Filed Weights   05/23/2019 2349 06/05/19 0500  Weight: 123.4 kg 123.4 kg    Intake/Output:   Intake/Output Summary (Last 24 hours) at 06/06/2019 1247 Last data filed at 06/06/2019 0700 Gross per 24 hour  Intake 612.64 ml  Output 500 ml  Net 112.64 ml      Physical Exam    General:  Short of breath talking HEENT: normal Neck: supple. JVP to ajw . Carotids 2+ bilat; no bruits. No lymphadenopathy or thyromegaly appreciated. Cor: PMI nondisplaced. Regular rate & rhythm. No rubs, gallops or murmurs. R upper chest porta cath  Lungs: clear on 5 liters [place].   Abdomen: soft, nontender, nondistended. No hepatosplenomegaly. No bruits or masses. Good bowel sounds. Extremities: no cyanosis, clubbing, rash, R and LLE 2-3+ edema with dressings in place Neuro: alert & orientedx3, cranial nerves grossly intact. moves all 4 extremities w/o difficulty. Affect flat    Telemetry   SR 60s personally reviewed.  EKG   SR 65 bpm   Labs   Basic Metabolic Panel: Recent Labs  Lab 06/05/19 0216 06/06/19 0056  NA 128* 130*  K 6.0* 5.9*  CL 92* 89*  CO2 25 25  GLUCOSE 146* 159*  BUN 83* 97*  CREATININE 3.05* 3.54*  CALCIUM 8.5* 8.4*  MG 2.6*  --   PHOS 6.3*  --     Liver Function Tests: Recent Labs  Lab 06/05/19 0216  AST 47*  ALT 31  ALKPHOS 130*  BILITOT 1.3*  PROT 7.6  ALBUMIN 3.2*   No results for input(s): LIPASE, AMYLASE in the last 168 hours. No results for input(s): AMMONIA in the last 168 hours.  CBC: Recent Labs  Lab 06/05/19 0216 06/06/19 0056  WBC 6.8 9.2  HGB 8.2* 8.1*  HCT 27.6* 27.1*  MCV 81.9 81.9  PLT 202 222    Cardiac Enzymes: No results for input(s): CKTOTAL, CKMB, CKMBINDEX, TROPONINI in  the last 168 hours.  BNP: BNP (last 3 results) Recent Labs    10/27/18 2210  BNP 778.0*    ProBNP (last 3 results) No results for input(s): PROBNP in the last 8760 hours.   CBG: Recent Labs  Lab 06/05/19 1959 06/05/19 2350 06/06/19 0341 06/06/19 0838 06/06/19 1235  GLUCAP 199* 147* 146* 141* 135*    Coagulation Studies: No results for input(s): LABPROT, INR in the last 72 hours.   Imaging   EEG  Result Date: 06/05/2019 Lora Havens, MD     06/05/2019  4:03 PM Patient Name: Tammy Mathews MRN: [id number] Epilepsy Attending: Lora Havens Referring Physician/Provider: Dr Lars Mage Date: 06/05/2019 Duration: 2 Patient history: 69 year old immunocompromise female with history of pulmonary hypertension who presented with transient loss of awareness thought initially to be in setting of acute on  chronic hypoxic respiratory failure.  EEG to evaluate for seizures. Level of alertness: awake AEDs during EEG study: None Technical aspects: This EEG study was done with scalp electrodes positioned according to the 10-20 International system of electrode placement. Electrical activity was acquired at a sampling rate of 500Hz and reviewed with a high frequency filter of 70Hz and a low frequency filter of 1Hz. EEG data were recorded continuously and digitally stored. Description: During awake state, no clear posterior dominant rhythm was seen.  EEG showed continuous generalized 3 to 6 Hz theta-delta slowing as well as intermittent rhythmic generalized 2 to 3 Hz delta slowing.  Hyperventilation photic stimulation not performed due to COPD and patient unable to tolerate the study. Of note, study was technically difficult due to significant myogenic artifact. Abnormality -Continuous slow, generalized -Intermittent rhythmic slow, generalized IMPRESSION: This technically difficult study is suggestive of mild to moderate diffuse encephalopathy, nonspecific etiology. No seizures or epileptiform discharges were seen throughout the recording. Lora Havens   DG CHEST PORT 1 VIEW  Result Date: 06/06/2019 CLINICAL DATA:  Chest pain and shortness of breath. EXAM: PORTABLE CHEST 1 VIEW COMPARISON:  05/12/2019 FINDINGS: The patient is rotated to the right with grossly unchanged cardiomediastinal silhouette. Cardiomegaly is again noted. A right subclavian Port-A-Cath terminates over the SVC, unchanged. Lung volumes remain low with elevation of the right hemidiaphragm. Curvilinear opacities in the right lung base and mid lungs bilaterally are similar to the prior study. No large pleural effusion or pneumothorax is identified. IMPRESSION: Low lung volumes with bilateral lung scarring and/or atelectasis. Electronically Signed   By: Logan Bores M.D.   On: 06/06/2019 10:31      Medications:     Current Medications: .  acetaminophen  650 mg Oral Q6H  . ambrisentan  10 mg Oral Daily  . amiodarone  200 mg Oral Daily  . aspirin  81 mg Oral Daily  . bethanechol  5 mg Oral BID  . Chlorhexidine Gluconate Cloth  6 each Topical Daily  . diclofenac Sodium  2 g Topical QID  . [START ON 06/07/2019] ferrous sulfate  325 mg Oral Q breakfast  . fluticasone  2 spray Each Nare Daily  . insulin aspart  1-3 Units Subcutaneous Q4H  . insulin glargine  5 Units Subcutaneous Daily  . lidocaine  1 patch Transdermal Q24H  . mouth rinse  15 mL Mouth Rinse BID  . Melatonin  4.5 mg Oral QHS  . mometasone-formoterol  2 puff Inhalation BID  . pantoprazole  40 mg Oral Q1200  . polyethylene glycol  17 g Oral Once  . senna  1 tablet Oral Daily  . [  START ON 06/07/2019] Vitamin D (Ergocalciferol)  50,000 Units Oral Q7 days     Infusions: . lactated ringers 100 mL/hr at 06/06/19 0928        Assessment/Plan   1. Syncope, suspect due to severe PAH Had CPR at SNF with ROSC ~15 min. No mention of arrhythmia and she has been in NSR since admit.  Etiology uncertain. She has chronic hypoxemic respiratory failure. This certainly could be due to long-standing pulmonary hypertension, but also possibly due to OSH/OSA. She has never smoked so COPD unlikely. In July 2020  High resolution CT chest did not show evidence for ILD, there was bibasilar atelectasis.   Also had V/Q scan in 2020 with perfusion defect likely due to atelectasis. Serologic workup for autoimmune causes of pulmonary hypertension was negative.  RHC shows severe pulmonary arterial hypertension.  Suspect that she has a combination of group 1 and group 3 (from OSA/OSA) PH.  Started ambrisentatn 10 mg daily last year. - ECHO this admit with PA pressure 80.  - Unable to obtain CTA due to elevated creatinine.   - Volume overloaded. Diurese. Once diuresed will need RHC to reassess pulmonary pressures. Needs Wirt conversation.   2. Chronic Diastolic Heart Failure ECHO this  admit Preserved EF 60-65% - Marked volume overload. Her weight has going up > 25 pounds since last discharge.  -  Start 80 mg IV lasix twice a day.  - Will need to watch creatinine closely. May need to add milrinone to support RV and augment diuresis.  - Place foley   3. A/C Respiratory Failure On 3 liters chronically. Sats are stable.    4. AKI Baseline creatinine 1.1-1.3  Creatinine on admit 3>3.5  Daily BMET    5. PAF On amio 200 mg daily Refuses anticoagulation due to sever epistaxis.  - Check TSH  - LFTs ok   6. Hyperkalemia  -Off spironolactone.   Length of Stay: 2  Darrick Grinder, NP  06/06/2019, 12:47 PM  Advanced Heart Failure Team Pager [phone] (M-F; 7a - 4p)  Please contact [facility] for night-coverage after hours (4p -7a ) and weekends on [email or web address]  Patient seen with NP, agree with the above note.   Patient was admitted after PEA arrest with 15 minutes of CPR.  Weight up considerably from baseline (>25 lbs).  Creatinine > 3 at admission.  Echo with EF 55-60%, moderately dilated RV, PASP 80 mmHg.    Currently, nauseated and feels poorly.  BP stable.  Creatinine 3.8 with BUN > 100.    General: NAD Neck: JVP 14-16 cm, no thyromegaly or thyroid nodule.  Lungs: Crackles at bases bilaterally.  CV: Nondisplaced PMI.  Heart regular S1/S2, no S3/S4, no murmur.  1+ edema to knees.  No carotid bruit. Unable to palpate pedal pulses.  Abdomen: Soft, nontender, no hepatosplenomegaly, mild distention.  Skin: Intact without lesions or rashes.  Neurologic: Alert and oriented x 3.  Psych: Normal affect. Extremities: No clubbing or cyanosis.  HEENT: Normal.   1. Syncope: This could be due to severe pulmonary hypertension, no arrhythmias seen yet on telemetry.   2. Pulmonary hypertension/RV failure: Severe pulmonary HTN with RV failure. Suspect that she has a combination of group 1 and group 3 (from OSA/OSA) PH.  Started ambrisentan 10 mg daily last year. She is markedly  volume overloaded on exam, CVP measured at around 30.   - Continue ambrisentan for now.  - Lasix 80 mg IV x 1 then start Lasix gtt at 12  mg/hr.  - Check co-ox, low threshold to start dobutamine for RV support (use dobutamine with worsening renal failure) => with rise in creatinine again, will go ahead and start dobutamine 2.5 mcg/kg/min.  - Eventual RHC.  3. Chronic hypoxemic respiratory failure: OHS/OSA.  She is on 3L home oxygen, her home dose.  - Lethargic, needs Bipap and ABG.  4. AKI on CKD stage 3: BUN/creatinine 104/3.8, possible worsening due to hypotension at time of PEA arrest.  She is markedly volume overloaded so will attempt diuresis as above. Would be poor HD candidate.  5. Atrial fibrillation: Paroxysmal, NSR currently. Has refused anticoagulation in the past.  - Continue amiodarone 200 mg daily.  - Unlikely PE so CCM stopped heparin gtt.   Loralie Champagne 06/06/2019 4:32 PM

## 2019-06-06 NOTE — Progress Notes (Signed)
  Speech Language Pathology Treatment: Dysphagia  Patient Details Name: Tammy Mathews MRN: [id number] DOB: [birth year] Today's Date: 06/06/2019 Time: 5462-7035 SLP Time Calculation (min) (ACUTE ONLY): 15 min  Assessment / Plan / Recommendation Clinical Impression  Pt seen at bedside for follow up after BSE completed 06/05/19. RN reports no observed difficulty at breakfast this morning. Pt had lunch tray, however, she was reporting nausea and did not want to eat yet. She was given sips of ginger ale, which she tolerated well. ST will sign off at this time. Please reconsult if needs arise.   HPI HPI: 69yo female admitted 05/09/2019 following syncopal episode. PMH: severe Pulmonary HTN. HFpEF 60-65%. PAFib, acute on chronic kidney injury      SLP Plan  Discharge SLP treatment due to All goals met       Recommendations  Diet recommendations: Regular;Thin liquid Liquids provided via: Straw;Cup Medication Administration: Whole meds with liquid Supervision: Patient able to self feed;Staff to assist with self feeding Compensations: Minimize environmental distractions;Slow rate;Small sips/bites Postural Changes and/or Swallow Maneuvers: Seated upright 90 degrees;Upright 30-60 min after meal                Oral Care Recommendations: Oral care BID Follow up Recommendations: None SLP Visit Diagnosis: Dysphagia, unspecified (R13.10) Plan: Discharge SLP treatment due to (comment);All goals met       GO              Egidio Lofgren B. Quentin Ore, [facility], [place] Speech Language Pathologist Office: [phone] Pager: [phone]  Shonna Chock 06/06/2019, 12:40 PM

## 2019-06-06 NOTE — Progress Notes (Signed)
Called stat to room d/t pt in resp distress, found pt to be w/ labored/shallow breathing, BBSH w/ little air movement t/o, but pt still responsive to questions.  Pt c/o SOB.  MD at bedside- bipap started per discussion w/ MD.  Pt appears to be tol bipap well currently.  Sat 98%

## 2019-06-07 DIAGNOSIS — Z7189 Other specified counseling: Secondary | ICD-10-CM

## 2019-06-07 DIAGNOSIS — I517 Cardiomegaly: Secondary | ICD-10-CM

## 2019-06-07 DIAGNOSIS — Z66 Do not resuscitate: Secondary | ICD-10-CM

## 2019-06-07 DIAGNOSIS — R570 Cardiogenic shock: Secondary | ICD-10-CM

## 2019-06-07 DIAGNOSIS — Z515 Encounter for palliative care: Secondary | ICD-10-CM

## 2019-06-07 DIAGNOSIS — N179 Acute kidney failure, unspecified: Secondary | ICD-10-CM

## 2019-06-07 LAB — URINALYSIS, ROUTINE W REFLEX MICROSCOPIC
Bilirubin Urine: NEGATIVE
Glucose, UA: NEGATIVE mg/dL
Ketones, ur: NEGATIVE mg/dL
Nitrite: NEGATIVE
Protein, ur: 100 mg/dL — AB
RBC / HPF: >50 RBC/hpf — ABNORMAL HIGH (ref 0–5)
Specific Gravity, Urine: 1.01 (ref 1.005–1.030)
WBC, UA: >50 WBC/hpf — ABNORMAL HIGH (ref 0–5)
pH: 5 (ref 5.0–8.0)

## 2019-06-07 LAB — BASIC METABOLIC PANEL
Anion gap: 14 (ref 5–15)
Anion gap: 16 — ABNORMAL HIGH (ref 5–15)
BUN: 106 mg/dL — ABNORMAL HIGH (ref 8–23)
BUN: 109 mg/dL — ABNORMAL HIGH (ref 8–23)
CO2: 25 mmol/L (ref 22–32)
CO2: 23 mmol/L (ref 22–32)
Calcium: 8.1 mg/dL — ABNORMAL LOW (ref 8.9–10.3)
Calcium: 8.1 mg/dL — ABNORMAL LOW (ref 8.9–10.3)
Chloride: 87 mmol/L — ABNORMAL LOW (ref 98–111)
Chloride: 89 mmol/L — ABNORMAL LOW (ref 98–111)
Creatinine, Ser: 4.07 mg/dL — ABNORMAL HIGH (ref 0.44–1.00)
Creatinine, Ser: 4.43 mg/dL — ABNORMAL HIGH (ref 0.44–1.00)
GFR calc Af Amer: 12 mL/min — ABNORMAL LOW (ref >60)
GFR calc Af Amer: 11 mL/min — ABNORMAL LOW (ref >60)
GFR calc non Af Amer: 10 mL/min — ABNORMAL LOW (ref >60)
GFR calc non Af Amer: 9 mL/min — ABNORMAL LOW (ref >60)
Glucose, Bld: 132 mg/dL — ABNORMAL HIGH (ref 70–99)
Glucose, Bld: 128 mg/dL — ABNORMAL HIGH (ref 70–99)
Potassium: 5.6 mmol/L — ABNORMAL HIGH (ref 3.5–5.1)
Potassium: 5.5 mmol/L — ABNORMAL HIGH (ref 3.5–5.1)
Sodium: 126 mmol/L — ABNORMAL LOW (ref 135–145)
Sodium: 128 mmol/L — ABNORMAL LOW (ref 135–145)

## 2019-06-07 LAB — CBC
HCT: 28.1 % — ABNORMAL LOW (ref 36.0–46.0)
Hemoglobin: 8.2 g/dL — ABNORMAL LOW (ref 12.0–15.0)
MCH: 24.5 pg — ABNORMAL LOW (ref 26.0–34.0)
MCHC: 29.2 g/dL — ABNORMAL LOW (ref 30.0–36.0)
MCV: 83.9 fL (ref 80.0–100.0)
Platelets: 176 10*3/uL (ref 150–400)
RBC: 3.35 MIL/uL — ABNORMAL LOW (ref 3.87–5.11)
RDW: 16.1 % — ABNORMAL HIGH (ref 11.5–15.5)
WBC: 8.7 10*3/uL (ref 4.0–10.5)
nRBC: 0.2 % (ref 0.0–0.2)

## 2019-06-07 LAB — IRON AND TIBC
Iron: 13 ug/dL — ABNORMAL LOW (ref 28–170)
Saturation Ratios: 3 % — ABNORMAL LOW (ref 10.4–31.8)
TIBC: 423 ug/dL (ref 250–450)
UIBC: 410 ug/dL

## 2019-06-07 LAB — GLUCOSE, CAPILLARY
Glucose-Capillary: 116 mg/dL — ABNORMAL HIGH (ref 70–99)
Glucose-Capillary: 125 mg/dL — ABNORMAL HIGH (ref 70–99)
Glucose-Capillary: 125 mg/dL — ABNORMAL HIGH (ref 70–99)
Glucose-Capillary: 125 mg/dL — ABNORMAL HIGH (ref 70–99)
Glucose-Capillary: 128 mg/dL — ABNORMAL HIGH (ref 70–99)
Glucose-Capillary: 128 mg/dL — ABNORMAL HIGH (ref 70–99)
Glucose-Capillary: 153 mg/dL — ABNORMAL HIGH (ref 70–99)

## 2019-06-07 MED ORDER — NOREPINEPHRINE 16 MG/250ML-% IV SOLN
2.0000 ug/min | INTRAVENOUS | Status: DC
  Administered 2019-06-07: 50 ug/min via INTRAVENOUS
  Filled 2019-06-07 (×2): qty 250

## 2019-06-07 MED ORDER — DEXMEDETOMIDINE HCL IN NACL 400 MCG/100ML IV SOLN
0.4000 ug/kg/h | INTRAVENOUS | Status: DC
  Administered 2019-06-07: 0.4 ug/kg/h via INTRAVENOUS
  Administered 2019-06-08: 0.6 ug/kg/h via INTRAVENOUS
  Administered 2019-06-08: 1 ug/kg/h via INTRAVENOUS
  Filled 2019-06-07 (×3): qty 100

## 2019-06-07 MED ORDER — SODIUM CHLORIDE 0.9 % IV SOLN: INTRAVENOUS | Status: DC | PRN

## 2019-06-07 MED ORDER — METOLAZONE 5 MG PO TABS
5.0000 mg | ORAL_TABLET | Freq: Once | ORAL | Status: AC
  Administered 2019-06-07: 5 mg via ORAL
  Filled 2019-06-07: qty 1

## 2019-06-07 MED ORDER — LORAZEPAM 2 MG/ML IJ SOLN
0.5000 mg | Freq: Once | INTRAMUSCULAR | Status: AC
  Administered 2019-06-07: 0.5 mg via INTRAVENOUS

## 2019-06-07 MED ORDER — HEPARIN SODIUM (PORCINE) 5000 UNIT/ML IJ SOLN
5000.0000 [IU] | Freq: Three times a day (TID) | INTRAMUSCULAR | Status: DC
  Administered 2019-06-07 – 2019-06-08 (×3): 5000 [IU] via SUBCUTANEOUS
  Filled 2019-06-07 (×3): qty 1

## 2019-06-07 MED ORDER — SODIUM CHLORIDE 0.9 % IV SOLN
500.0000 mg | Freq: Two times a day (BID) | INTRAVENOUS | Status: DC
  Administered 2019-06-07 (×2): 500 mg via INTRAVENOUS
  Filled 2019-06-07: qty 0.5
  Filled 2019-06-07: qty 500
  Filled 2019-06-07 (×2): qty 0.5

## 2019-06-07 MED ORDER — LORAZEPAM 2 MG/ML IJ SOLN
INTRAMUSCULAR | Status: AC
  Filled 2019-06-07: qty 1

## 2019-06-07 MED ORDER — NOREPINEPHRINE 4 MG/250ML-% IV SOLN
2.0000 ug/min | INTRAVENOUS | Status: DC
  Administered 2019-06-07: 12 ug/min via INTRAVENOUS
  Administered 2019-06-07: 16 ug/min via INTRAVENOUS
  Administered 2019-06-07: 2 ug/min via INTRAVENOUS
  Administered 2019-06-07: 13 ug/min via INTRAVENOUS
  Filled 2019-06-07 (×4): qty 250

## 2019-06-07 MED ORDER — DEXTROSE 5 % IV SOLN
500.0000 mg | Freq: Once | INTRAVENOUS | Status: AC
  Administered 2019-06-07: 500 mg via INTRAVENOUS
  Filled 2019-06-07: qty 500

## 2019-06-07 NOTE — Progress Notes (Addendum)
Unable to get temperature at 0000, applied warm blankets. 0130 Rectal temp 93.7, while laying flat, pt became hypotensive, dyspnea and desat to 84%, clammy to touch, no urine output with lasix gtt. Notified Dr. Viviann Spare. Received new orders for levophed gtt, A-line insertion and bair hugger.

## 2019-06-07 NOTE — Progress Notes (Signed)
PULMONARY / CRITICAL CARE MEDICINE   NAME:  Tammy Mathews, MRN:  [id number], DOB:  10/05/1950, LOS: 3 ADMISSION DATE:  05/11/2019, CONSULTATION DATE:  06/08/2019 REFERRING MD:  [facility], CHIEF COMPLAINT:  Unresponsive  BRIEF HISTORY:    Tammy Mathews is a 69 y.o immunocompromised female with pulmonary htn on ambrisentan, paf not on anticoagulation given prior severe epistaxis, breast cancer, osa, chronic rv failure, hfpef ef 60-65% per tte 6/20,ckd 3,  chronic hypoxic respiratory failure on o2 chronically who resides at [facility] [place] [facility] presented for evaluation of what was thought to be a syncopal episode as the patient recalls chest compressions. Patient was noted to have had worsening dyspnea for the past 2-3 days, decreased ambulation, and with increased lower extremity swelling.   Patient being managed for syncopal episode that is thought to be in setting of acute on chronic hypoxic respiratory failure.   SIGNIFICANT PAST MEDICAL HISTORY   Has a past medical history of Breast cancer ([place]), COPD (chronic obstructive pulmonary disease) ([place]), Diabetes mellitus type 2 in obese ([place]), HLD (hyperlipidemia), HTN (hypertension), and Morbid obesity ([place]).  SIGNIFICANT EVENTS:  1/27 transferred from [facility] to [facility] 1/29 Recurrent resp distress and failure requiring BiPAP  STUDIES:   1/27 CT chest (OHS)>> Mild to moderate severity areas of atelectasis and/or infiltrate within the bilateral upper lobes and right lower lobe.  Small left pleural effusion.  Mild cardiomegaly.   CULTURES:  1/27 SARS 2, flu a, b negative  1/27 urine culture pending   ANTIBIOTICS:  Ceftriaxone 1/27>1/27  LINES/TUBES:  Right antecubital peripheral IV 1/27 Right chest implanted port 6/30  CONSULTANTS:  Heart failure team  SUBJECTIVE:   Patient states that she is having constant chest pain that feels like soreness. She is easily getting dyspneic when she moves. She has not had bowel movement in 1  day.   CONSTITUTIONAL: BP (!) 125/46   Pulse 73   Temp (!) 94.3 F (34.6 C) (Rectal)   Resp 19   Ht _0  (1.575 m)   Wt 125.4 kg   SpO2 96%   BMI 50.56 kg/m   I/O last 3 completed shifts: In: 1486.1 [P.O.:240; I.V.:1168.1; IV Piggyback:77.9] Out: 900 [Urine:900]  PHYSICAL EXAM: General: appears in distress due to pain  Neuro: alert, oriented x3, able to move all extremities  HEENT: Tammy Mathews intact Cardiovascular: bruised area on left upper chest, s1 and s2 audible  Lungs: On 5L [place], ctab, no rales or rhonchi Abdomen: soft, nondistended Musculoskeletal:  No pitting edema Skin: appears dry  ASSESSMENT AND PLAN    Syncopal episode, suspect due to decompensated PAH with associated respiratory failure Initially on heparin for possible PE, stopped on 1/29  Acute on chronic hypoxic respiratory failure due to OSA/OHS, COPD, PAH Severe Pulmonary hypertension, largely secondary to the above but also suspected to have group 1 disease, decompensated with associated cor pulmonale and shock. Continue home ambrisentan Continue norepinephrine, dobutamine, Lasix infusion to facilitate volume removal given profound PAH and associated cardiogenic shock.  Appreciate advanced heart failure team assistance here BiPAP as needed.  May be reasonable to schedule nightly given her OSA/OHS Continue Dulera, DuoNeb available as needed   Chest wall tenderness  Patient states that she has constant chest wall pain that is worsened with deep breath. She has a bruised area in left upper chest subsequent to chest compressions. EKG 1/29 without ischemic changes  Supportive care and pain control No evidence of rib fractures on her chest x-ray  Acute  on chronic kidney injury  Unfortunately continues to worsen 1/30.  Appreciate nephrology evaluation Markedly volume overloaded, now on pressors plus inotrope, Lasix infusion.  Hopefully we can restore adequate renal perfusion if we can remove volume with this regimen.   She is a very poor candidate for dialysis in either the acute or the chronic setting.  I do not think she could tolerate.  Discussed with Tammy Mathews with the [facility] service who agrees.  We need to discuss transition to palliation if we do not achieve stability on her current regimen.  Primary contact is her sister.  Palliative care consultation obtained 1/30 Lasix drip as outlined above, facilitated by pressors, dobutamine Follow BMP and urine output IV fluids discontinued  Atrial fibrillation  Patient is on amiodarone 219m qd which is being continued. The patient is only on asa for anticoagulation as had severe epistaxis episode.  Continue amiodarone, aspirin as ordered  Diabetes Mellitus Sliding-scale insulin as per protocol Lantus 5 units daily.   Best Practice / Goals of Care / Disposition.   DVT PROPHYLAXIS: heparin sq SUP: pantoprazole NUTRITION: heart healthy MOBILITY: pt and ot  GOALS OF CARE: Full  FAMILY DISCUSSIONS: I discussed with the patient's Sister Tammy Mathews 1/30.  I have explained her current status, current interventions.  I recommended that she would not tolerate hemodialysis and that we should defer this.  SLovey Newcomerunderstands and agrees.  She would like for me to also call their brother DKasandra Knudsento explain to him.  I will try to discuss CODE STATUS with both of them also.  She is full code for now. DISPOSITION ICU  LABS  Glucose Recent Labs  Lab 06/06/19 1235 06/06/19 1500 06/06/19 2004 06/07/19 0004 06/07/19 0354 06/07/19 0831  GLUCAP 135* 108* 142* 125* 153* 128*    BMET Recent Labs  Lab 06/06/19 1405 06/06/19 1827 06/07/19 0508  NA 127* 128* 126*  K 6.0* 5.7* 5.6*  CL 88* 89* 87*  CO2 _0 BUN 104* 103* 106*  CREATININE 3.82* 3.82* 4.07*  GLUCOSE 107* 113* 132*    Liver Enzymes Recent Labs  Lab 06/05/19 0216  AST 47*  ALT 31  ALKPHOS 130*  BILITOT 1.3*  ALBUMIN 3.2*    Electrolytes Recent Labs  Lab 06/05/19 0216 06/06/19 0056  06/06/19 1405 06/06/19 1827 06/07/19 0508  CALCIUM 8.5*   < > 8.5* 8.3* 8.1*  MG 2.6*  --   --   --   --   PHOS 6.3*  --   --   --   --    < > = values in this interval not displayed.    CBC Recent Labs  Lab 06/05/19 0216 06/06/19 0056 06/07/19 0508  WBC 6.8 9.2 8.7  HGB 8.2* 8.1* 8.2*  HCT 27.6* 27.1* 28.1*  PLT 202 222 176    ABG Recent Labs  Lab 06/05/19 0323  PHART 7.334*  PCO2ART 49.4*  PO2ART 79.6*    Coag's No results for input(s): APTT, INR in the last 168 hours.  Sepsis Markers Recent Labs  Lab 06/05/19 0216  PROCALCITON 0.20    Cardiac Enzymes No results for input(s): TROPONINI, PROBNP in the last 168 hours.   Independent CC time 32 minutes  RBaltazar Apo MD, PhD 06/07/2019, 11:17 AM [facility] [phone] if no answer [phone]

## 2019-06-07 NOTE — Consult Note (Signed)
Consultation Note Date: 06/07/2019   Patient Name: Tammy Mathews  DOB: 28-May-1950  MRN: [id number]  Age / Sex: 69 y.o., female  PCP: Greig Right, MD Referring Physician: Collene Gobble, MD  Reason for Consultation: Establishing goals of care  HPI/Patient Profile:  16 yoF with hx severe pulmonary hypertension, chronic RV failure, diastolic HF, chronic hypoxic respiratory failure on O2 and PAF not on [facility] presenting from SNF after sudden unresponsive episode.  CPR performed by SNF.  ROSC with EMS, since hemodynamically stable and neuro intact.  Concern for PE in ER and empirically started on heparin.  Transferred to [facility] for further evaluation.   Clinical Assessment and Goals of Care: I have reviewed medical records including EPIC notes, labs and imaging, received report from bedside RN, Morey Hummingbird assessed the patient.    I met with Lillie Columbia to further discuss diagnosis prognosis, GOC, EOL wishes, disposition and options. I introduced Palliative Medicine as specialized medical care for people living with serious illness. It focuses on providing relief from the symptoms and stress of a serious illness. The goal is to improve quality of life for both the patient and the family.  [name] was uncomfortable in the bed and kept asking for readjustments. When she was in a more comfortable position we talked about her present health state. She shared that lives at [facility] presently. Prior to there she lived at home with her brother, Tammy Mathews. She is one of six children. She was born in [place] and moved to [place] when she was [age] as her parents needed to move. She was never married and has no children though her youngest brother and she have a 29 year gap so in essence she raised him when her mother went back to work. She fell in love once, however the man she fell in love with went to [place] in the war  and ended up marrying another woman. She never dated after that. I asked [name] what she use to do and she stated, "I use to do everything." She had worked at a [employer] for many years which closed down.  I asked [name] to give me an idea of why she was admitted to the hospital. She was able to share that she was not breathing well due to her COPD. I asked her if she felt any improvement presently which she said that she does not. I talked to her about the things that bring her joy presently which include her family and strong faith. She is [employer]  We discussed her present code status. She said that she had received CPR before and it "really hurt." We talked about the trauma CPR can cause to your body if done right. She said she now knows first hand. She and I discussed intubation and mechanical ventilation. She said that she would not want to be hooked up to a machine "like that." Shared concerns that she is not doing well despite medical efforts to improve her current state.  I asked  her to repeat back to me what we had discussed but she was unclear on certain elements despite multiple clarifications therefore I asked her if it would be alright if I called her sister, Tammy Mathews to discuss these topics with her as well which she consented to.   I called Tammy Mathews thereafter. I asked if all of the information miss Tammy Mathews had provided to me was accurate. She said that it was.   Tammy Mathews told me that she had just spoken to Dr. Lamonte Sakai who had updated her that miss Tammy Mathews is not a good HD candidate. Tammy Mathews stated that her sister has been through "so much" over the past few years. They use to enjoy going to the beach, to holiday shows, and puttering about locally. As of recent years they had not been able to do any of those things which Tammy Mathews feels has had an effect on miss Crew.   Tammy Mathews stated that she loves her sister and wishes to make the decisions that are right for her. She said that she  would not want her sister to endure CPR or intubation. She said that these things will lead to additional harm as opposed to improvement. She had spoken with Dr. Lamonte Sakai about continuing present interventions (pressors, IV diuresis) though if not improving would want [name] to be made comfortable. We talked about what that may Mathews like in the hospital setting which Tammy Mathews seemed to have good understanding of.   Discussed with patient the importance of continued conversation with family and their  medical providers regarding overall plan of care and treatment options, ensuring decisions are within the context of the patients values and GOCs.  SUMMARY OF RECOMMENDATIONS   Spoke to patient and her sister Tammy Mathews confirmed DNR/DNI, no HD.   Continue supportive measures, if patient should deteriorate despite these would transition to comfort measures. Tammy Mathews requested to be called and updated if this occurs.   Code Status/Advance Care Planning:  DNR  Symptom Management:  Per Intensivist Team  Palliative Prophylaxis:   Aspiration, Bowel Regimen, Delirium Protocol, Eye Care, Frequent Pain Assessment, Oral Care, Palliative Wound Care and Turn Reposition  Additional Recommendations (Limitations, Scope, Preferences):  Plan to continue supportive treatment to evaluate for improvement. If worsening clinical condition would transition to comfort emphasis  Psycho-social/Spiritual:   Desire for further Chaplaincy support: YES  Additional Recommendations: Caregiving  Support/Resources  Prognosis:   If unable to improve current state will have hours to days of life  Discharge Planning: To Be Determined      Primary Diagnoses: Present on Admission: . Pulmonary hypertension ([place])  I have reviewed the medical record, interviewed the patient and family, and examined the patient. The following aspects are pertinent.  Past Medical History:  Diagnosis Date  . Breast cancer ([place])   . COPD  (chronic obstructive pulmonary disease) ([place])   . Diabetes mellitus type 2 in obese ([place])   . HLD (hyperlipidemia)   . HTN (hypertension)   . Morbid obesity ([place])    Social History   Socioeconomic History  . Marital status: Single    Spouse name: Not on file  . Number of children: Not on file  . Years of education: Not on file  . Highest education level: Not on file  Occupational History  . Not on file  Tobacco Use  . Smoking status: Never Smoker  . Smokeless tobacco: Never Used  Substance and Sexual Activity  . Alcohol use: Never  . Drug use: Never  . Sexual  activity: Not on file  Other Topics Concern  . Not on file  Social History Narrative  . Not on file   Social Determinants of Health   Financial Resource Strain:   . Difficulty of Paying Living Expenses: Not on file  Food Insecurity:   . Worried About Charity fundraiser in the Last Year: Not on file  . Ran Out of Food in the Last Year: Not on file  Transportation Needs:   . Lack of Transportation (Medical): Not on file  . Lack of Transportation (Non-Medical): Not on file  Physical Activity:   . Days of Exercise per Week: Not on file  . Minutes of Exercise per Session: Not on file  Stress:   . Feeling of Stress : Not on file  Social Connections:   . Frequency of Communication with Friends and Family: Not on file  . Frequency of Social Gatherings with Friends and Family: Not on file  . Attends Religious Services: Not on file  . Active Member of Clubs or Organizations: Not on file  . Attends Archivist Meetings: Not on file  . Marital Status: Not on file   Family History  Problem Relation Age of Onset  . Heart disease Mother   . Colon cancer Mother   . Breast cancer Mother   . Emphysema Father        smoked  . Emphysema Brother        smoked   Scheduled Meds: . acetaminophen  650 mg Oral Q6H  . ambrisentan  10 mg Oral Daily  . amiodarone  200 mg Oral Daily  . aspirin  81 mg Oral Daily  .  bethanechol  5 mg Oral BID  . Chlorhexidine Gluconate Cloth  6 each Topical Daily  . diclofenac Sodium  2 g Topical QID  . ferrous sulfate  325 mg Oral Q breakfast  . fluticasone  2 spray Each Nare Daily  . heparin injection (subcutaneous)  5,000 Units Subcutaneous Q8H  . insulin aspart  1-3 Units Subcutaneous Q4H  . insulin glargine  5 Units Subcutaneous Daily  . lidocaine  1 patch Transdermal Q24H  . mouth rinse  15 mL Mouth Rinse BID  . Melatonin  4.5 mg Oral QHS  . mometasone-formoterol  2 puff Inhalation BID  . pantoprazole  40 mg Oral Q1200  . senna  1 tablet Oral Daily  . sodium zirconium cyclosilicate  10 g Oral BID  . Vitamin D (Ergocalciferol)  50,000 Units Oral Q7 days   Continuous Infusions: . sodium chloride 10 mL/hr at 06/07/19 1100  . DOBUTamine 2.5 mcg/kg/min (06/07/19 1100)  . furosemide (LASIX) infusion 20 mg/hr (06/07/19 1100)  . norepinephrine (LEVOPHED) Adult infusion 15 mcg/min (06/07/19 1100)   PRN Meds:.Place/Maintain arterial line **AND** sodium chloride, ipratropium-albuterol, loratadine, ondansetron, [COMPLETED] polyethylene glycol **FOLLOWED BY** polyethylene glycol, traMADol Medications Prior to Admission:  Prior to Admission medications   Medication Sig Start Date End Date Taking? Authorizing Provider  acetaminophen (TYLENOL) 500 MG tablet Take 1,000 mg by mouth every 8 (eight) hours as needed for mild pain.    Yes [provider]  albuterol (PROVENTIL HFA;VENTOLIN HFA) 108 (90 Base) MCG/ACT inhaler Inhale 2 puffs into the lungs every 4 (four) hours as needed for wheezing or shortness of breath.    Yes [provider]  ambrisentan (LETAIRIS) 10 MG tablet Take 1 tablet (10 mg total) by mouth daily. 04/18/19  Yes Larey Dresser, MD  amiodarone (PACERONE) 200 MG tablet  Take 200 mg by mouth daily. 05/29/19  Yes [provider]  anastrozole (ARIMIDEX) 1 MG tablet Take 1 mg by mouth daily.   Yes [provider]  aspirin EC  81 MG tablet Take 81 mg by mouth daily.   Yes [provider]  bethanechol (URECHOLINE) 5 MG tablet Take 5 mg by mouth 2 (two) times daily. 05/30/19  Yes [provider]  budesonide (PULMICORT) 0.5 MG/2ML nebulizer solution Inhale 2 mLs into the lungs every 6 (six) hours as needed for shortness of breath. 06/23/18  Yes [provider]  budesonide-formoterol (SYMBICORT) 80-4.5 MCG/ACT inhaler Take 2 puffs first thing in am and then another 2 puffs about 12 hours later. 06/01/17  Yes Tanda Rockers, MD  cefTRIAXone (ROCEPHIN) 1 g injection Inject 1 g into the muscle daily. For 5 Days 05/31/2019  Yes [provider]  docusate sodium (COLACE) 100 MG capsule Take 100 mg by mouth at bedtime.   Yes [provider]  ferrous sulfate 325 (65 FE) MG tablet Take 325 mg by mouth daily with breakfast.   Yes [provider]  fluticasone (FLONASE) 50 MCG/ACT nasal spray Place 2 sprays into both nostrils daily. 05/23/19  Yes [provider]  guaifenesin (HUMIBID E) 400 MG TABS tablet Take 400 mg by mouth 2 (two) times daily. For 14 days 05/31/2019  Yes [provider]  ipratropium-albuterol (DUONEB) 0.5-2.5 (3) MG/3ML SOLN Take 3 mLs by nebulization every 6 (six) hours. X 5 days 06/03/2019  Yes [provider]  ipratropium-albuterol (DUONEB) 0.5-2.5 (3) MG/3ML SOLN Take 3 mLs by nebulization every 4 (four) hours as needed (shortness of breath).   Yes [provider]  LEVEMIR FLEXTOUCH 100 UNIT/ML Pen Inject 26 Units into the skin at bedtime. 05/19/19  Yes [provider]  loperamide (IMODIUM A-D) 2 MG tablet Take 2 mg by mouth 4 (four) times daily as needed for diarrhea or loose stools.   Yes [provider]  loratadine (CLARITIN) 10 MG tablet Take 10 mg by mouth daily as needed for allergies.   Yes [provider]  meclizine (ANTIVERT) 25 MG tablet Take 25 mg by mouth every 6 (six) hours as needed for dizziness.     Yes [provider]  Melatonin 5 MG TABS Take 5 mg by mouth at bedtime.   Yes [provider]  Menthol, Topical Analgesic, (BIOFREEZE) 4 % GEL Apply 1 application topically 2 (two) times daily. For left shoulder pain   Yes [provider]  Morphine Sulfate (MORPHINE CONCENTRATE) 10 mg / 0.5 ml concentrated solution Take 0.25 mLs by mouth every 2 (two) hours as needed for shortness of breath. 05/29/19  Yes [provider]  nitrofurantoin, macrocrystal-monohydrate, (MACROBID) 100 MG capsule Take 100 mg by mouth 2 (two) times daily. 06/03/19  Yes [provider]  ondansetron (ZOFRAN) 4 MG tablet Take 4 mg by mouth every 6 (six) hours as needed for nausea or vomiting.   Yes [provider]  OXYGEN 3 L continuous. [facility]   Yes [provider]  potassium chloride SA (KLOR-CON) 20 MEQ tablet Take 20 mEq by mouth daily. 05/23/19  Yes [provider]  senna (SENOKOT) 8.6 MG TABS tablet Take 1 tablet by mouth every 12 (twelve) hours as needed for mild constipation.   Yes [provider]  spironolactone (ALDACTONE) 25 MG tablet Take 25 mg by mouth daily. X 7 days 05/12/2019  Yes [provider]  tamsulosin (FLOMAX) 0.4 MG CAPS  capsule Take 0.4 mg by mouth daily. 05/27/19  Yes [provider]  torsemide (DEMADEX) 20 MG tablet Take 1 tablet (20 mg total) by mouth daily. Please cancel all previous orders for current medication. Change in dosage or pill size. Patient taking differently: Take 20 mg by mouth 2 (two) times daily.  12/05/18 06/06/19 Yes Clegg, Amy D, NP  Vitamin D, Ergocalciferol, (DRISDOL) 1.25 MG (50000 UNIT) CAPS capsule Take 50,000 Units by mouth every 7 (seven) days. Thursday   Yes [provider]  ACCU-CHEK AVIVA PLUS test strip  02/06/18   [provider]  insulin aspart (NOVOLOG) 100 UNIT/ML injection Sliding scale for glucose:  CBG < 70: Implement Hypoglycemia Protocol  CBG 70 - 120: 0  units  CBG 121 - 150: 1 unit  CBG 151 - 200: 2 units  CBG 201 - 250: 3 units  CBG 251 - 300: 5 units  CBG 301 - 350: 7 units  CBG 351 - 400 9 units  CBG > 400 Notify facility MD Patient not taking: Reported on 06/06/2019 11/08/18   Donne Hazel, MD  Insulin Glargine (LANTUS SOLOSTAR) 100 UNIT/ML Solostar Pen Inject 8 Units into the skin at bedtime.    [provider]   Allergies  Allergen Reactions  . Codeine Nausea And Vomiting and Other (See Comments)    Unknown   . Morphine And Related Nausea And Vomiting   Review of Systems  Constitutional: Positive for activity change, appetite change and fatigue.  HENT: Positive for dental problem.   Respiratory: Positive for chest tightness and shortness of breath.   Cardiovascular: Positive for leg swelling.   Physical Exam Vitals and nursing note reviewed.  HENT:     Head: Normocephalic.     Nose: Nose normal.  Eyes:     Pupils: Pupils are equal, round, and reactive to light.  Cardiovascular:     Rate and Rhythm: Normal rate and regular rhythm.     Pulses: Normal pulses.  Pulmonary:     Effort: Respiratory distress present.  Abdominal:     Palpations: Abdomen is soft.  Musculoskeletal:        General: Normal range of motion.     Cervical back: Normal range of motion.  Skin:    General: Skin is dry.     Capillary Refill: Capillary refill takes less than 2 seconds.  Neurological:     Mental Status: She is alert.     Comments: Oriented to person, place, and situation  Psychiatric:        Attention and Perception: She is inattentive.    Vital Signs: BP (!) 125/46   Pulse 70   Temp (!) 94.5 F (34.7 C) (Axillary)   Resp 19   Ht _0  (1.575 m)   Wt 125.4 kg   SpO2 95%   BMI 50.56 kg/m  Pain Scale: 0-10   Pain Score: 0-No pain   SpO2: SpO2: 95 % O2 Device:SpO2: 95 % O2 Flow Rate: .O2 Flow Rate (L/min): 6 L/min  IO: Intake/output summary:   Intake/Output Summary (Last 24 hours) at 06/07/2019 1219 Last  data filed at 06/07/2019 1100 Gross per 24 hour  Intake 1731.16 ml  Output 698 ml  Net 1033.16 ml    LBM: Last BM Date: 05/10/2019 Baseline Weight: Weight: 123.4 kg Most recent weight: Weight: 125.4 kg     Palliative Assessment/Data: 20%  Time In: 1130 Time Out:1240 Time Total: 70 Greater than 50%  of this time was spent  counseling and coordinating care related to the above assessment and plan.  Signed by: Rosezella Rumpf, NP   Please contact Palliative Medicine Team phone at [phone] for questions and concerns.  For individual provider: See Shea Evans

## 2019-06-07 NOTE — Consult Note (Signed)
WOC Nurse Consult Note: Reason for Consult:Dr. Byrum requested bilateral Unna's Boots for control of edema.   Wound type: No wounds. Pressure Injury POA: N/A  WOC Nurse assissted by placing Orders for bilateral Unna's Boots to be placed tomorrow and maintained, changing twice weekly while in house by Sheliah Hatch on Sundays and Wednesdays.  [place] nursing team will not follow, but will remain available to this patient, the nursing and medical teams.  Please re-consult if needed. Thanks, Maudie Flakes, MSN, RN, [place], Arther Abbott  Pager# [phone]

## 2019-06-07 NOTE — Progress Notes (Addendum)
Patient ID: Tammy Mathews, female   DOB: 01-23-1951, 69 y.o.   MRN: [id number]     Advanced Heart Failure Rounding Note  PCP-Cardiologist: No primary care provider on file.   Subjective:    More alert this morning, interacts and answers questions.  Only 600 cc urine output on Lasix gtt overnight.  She remains on dobutamine 2.5 + norepinephrine 16, Lasix gtt 12 mg/hr.    CVP 30 still, co-ox 89%.   BUN/creatinine up to 106/4.07. K 5.6, Lokelma ordered.    Objective:   Weight Range: 125.4 kg Body mass index is 50.56 kg/m.   Vital Signs:   Temp:  [93.7 F (34.3 C)-97.6 F (36.4 C)] 94.3 F (34.6 C) (01/30 0409) Pulse Rate:  [57-80] 79 (01/30 0630) Resp:  [18-30] 19 (01/30 0630) BP: (53-142)/(32-92) 125/46 (01/30 0630) SpO2:  [83 %-100 %] 92 % (01/30 0630) Arterial Line BP: (115-141)/(32-50) 125/46 (01/30 0630) FiO2 (%):  [50 %] 50 % (01/29 1651) Weight:  [125.4 kg] 125.4 kg (01/30 0515) Last BM Date: 05/17/2019  Weight change: Filed Weights   06/01/2019 2349 06/05/19 0500 06/07/19 0515  Weight: 123.4 kg 123.4 kg 125.4 kg    Intake/Output:   Intake/Output Summary (Last 24 hours) at 06/07/2019 0812 Last data filed at 06/07/2019 0600 Gross per 24 hour  Intake 1400.24 ml  Output 600 ml  Net 800.24 ml      Physical Exam    General:  Chronically ill-appearing.  HEENT: Normal Neck: Supple. JVP 16+. Carotids 2+ bilat; no bruits. No lymphadenopathy or thyromegaly appreciated. Cor: PMI nondisplaced. Regular rate & rhythm. 2/6 SEM RUSB with clear S2 Lungs: Crackles at bases.  Abdomen: Soft, nontender, nondistended. No hepatosplenomegaly. No bruits or masses. Good bowel sounds. Extremities: No cyanosis, clubbing, rash. 2+ edema to knees.  Neuro: Alert & orientedx3, cranial nerves grossly intact. moves all 4 extremities w/o difficulty. Affect pleasant   Telemetry   NSR 80s (personally reviewed)  Labs    CBC Recent Labs    06/06/19 0056 06/07/19 0508  WBC 9.2 8.7    HGB 8.1* 8.2*  HCT 27.1* 28.1*  MCV 81.9 83.9  PLT 222 409   Basic Metabolic Panel Recent Labs    06/05/19 0216 06/06/19 0056 06/06/19 1827 06/07/19 0508  NA 128*   < > 128* 126*  K 6.0*   < > 5.7* 5.6*  CL 92*   < > 89* 87*  CO2 25   < > 26 25  GLUCOSE 146*   < > 113* 132*  BUN 83*   < > 103* 106*  CREATININE 3.05*   < > 3.82* 4.07*  CALCIUM 8.5*   < > 8.3* 8.1*  MG 2.6*  --   --   --   PHOS 6.3*  --   --   --    < > = values in this interval not displayed.   Liver Function Tests Recent Labs    06/05/19 0216  AST 47*  ALT 31  ALKPHOS 130*  BILITOT 1.3*  PROT 7.6  ALBUMIN 3.2*   No results for input(s): LIPASE, AMYLASE in the last 72 hours. Cardiac Enzymes No results for input(s): CKTOTAL, CKMB, CKMBINDEX, TROPONINI in the last 72 hours.  BNP: BNP (last 3 results) Recent Labs    10/27/18 2210  BNP 778.0*    ProBNP (last 3 results) No results for input(s): PROBNP in the last 8760 hours.   D-Dimer No results for input(s): DDIMER in the last 72 hours. Hemoglobin  A1C Recent Labs    06/05/19 0216  HGBA1C 6.3*   Fasting Lipid Panel No results for input(s): CHOL, HDL, LDLCALC, TRIG, CHOLHDL, LDLDIRECT in the last 72 hours. Thyroid Function Tests No results for input(s): TSH, T4TOTAL, T3FREE, THYROIDAB in the last 72 hours.  Invalid input(s): FREET3  Other results:   Imaging    DG CHEST PORT 1 VIEW  Result Date: 06/06/2019 CLINICAL DATA:  Chest pain and shortness of breath. EXAM: PORTABLE CHEST 1 VIEW COMPARISON:  06/08/2019 FINDINGS: The patient is rotated to the right with grossly unchanged cardiomediastinal silhouette. Cardiomegaly is again noted. A right subclavian Port-A-Cath terminates over the SVC, unchanged. Lung volumes remain low with elevation of the right hemidiaphragm. Curvilinear opacities in the right lung base and mid lungs bilaterally are similar to the prior study. No large pleural effusion or pneumothorax is identified.  IMPRESSION: Low lung volumes with bilateral lung scarring and/or atelectasis. Electronically Signed   By: Logan Bores M.D.   On: 06/06/2019 10:31      Medications:     Scheduled Medications: . acetaminophen  650 mg Oral Q6H  . ambrisentan  10 mg Oral Daily  . amiodarone  200 mg Oral Daily  . aspirin  81 mg Oral Daily  . bethanechol  5 mg Oral BID  . Chlorhexidine Gluconate Cloth  6 each Topical Daily  . diclofenac Sodium  2 g Topical QID  . ferrous sulfate  325 mg Oral Q breakfast  . fluticasone  2 spray Each Nare Daily  . insulin aspart  1-3 Units Subcutaneous Q4H  . insulin glargine  5 Units Subcutaneous Daily  . lidocaine  1 patch Transdermal Q24H  . mouth rinse  15 mL Mouth Rinse BID  . Melatonin  4.5 mg Oral QHS  . mometasone-formoterol  2 puff Inhalation BID  . pantoprazole  40 mg Oral Q1200  . senna  1 tablet Oral Daily  . sodium zirconium cyclosilicate  10 g Oral BID  . Vitamin D (Ergocalciferol)  50,000 Units Oral Q7 days     Infusions: . sodium chloride 10 mL/hr at 06/07/19 0600  . DOBUTamine 2.5 mcg/kg/min (06/07/19 0600)  . furosemide (LASIX) infusion 12 mg/hr (06/07/19 0600)  . norepinephrine (LEVOPHED) Adult infusion 16 mcg/min (06/07/19 0729)     PRN Medications:  Place/Maintain arterial line **AND** sodium chloride, ipratropium-albuterol, loratadine, ondansetron, [COMPLETED] polyethylene glycol **FOLLOWED BY** polyethylene glycol, traMADol    Assessment/Plan   1. Syncope: This could be due to severe pulmonary hypertension, no arrhythmias seen yet on telemetry.   2. Pulmonary hypertension/RV failure: Severe pulmonary HTN with RV failure. Suspect that she has a combination of group 1 and group 3 (from OSA/OSA) PH. Started ambrisentan 10 mg daily last year. She is markedly volume overloaded on exam, CVP measured at around 30. Poor response to diuresis with AKI and rising creatinine.  Now on dobutamine 2.5 + NE 16 with co-ox 89%.   - Continue ambrisentan  for now.  - Increase lasix gtt to 20 mg/hr and will give a dose of metolazone 5 mg po x 1.  If no response, will need to consider limited CVVH trial versus transition to comfort care.  I do NOT think that she would tolerate long-term HD.  Palliative care referral.  - Continue dobutamine 2.5 mcg/kg/min for RV support, good co-ox this morning.  Continue NE to maintain MAP.   - Eventual RHC.  3. Acute on chronic hypoxemic respiratory failure: OHS/OSA, now with likely pulmonary edema.  Afebrile,  normal WBCs.  - Remains on 6L [place].  - Continue diuresis.  4. AKI on CKD stage 3: BUN/creatinine rising, possible worsening due to hypotension at time of PEA arrest + RV failure. She is markedly volume overloaded so attempting diuresis as above. Would be poor HD candidate but will need to consider limited course of CVVH if she does not respond to more aggressive diuretic regimen today.  - Nephrology following.  5. Atrial fibrillation: Paroxysmal, NSR currently. Has refused anticoagulation in the past.  - Continue amiodarone 200 mg daily.  - Unlikely PE so CCM stopped heparin gtt. Would provide heparin DVT prophylaxis.  6. Hyperkalemia: Getting Lokelma, repeat BMET in afternoon.   CRITICAL CARE Performed by: Loralie Champagne  Total critical care time: 35 minutes  Critical care time was exclusive of separately billable procedures and treating other patients.  Critical care was necessary to treat or prevent imminent or life-threatening deterioration.  Critical care was time spent personally by me on the following activities: development of treatment plan with patient and/or surrogate as well as nursing, discussions with consultants, evaluation of patient's response to treatment, examination of patient, obtaining history from patient or surrogate, ordering and performing treatments and interventions, ordering and review of laboratory studies, ordering and review of radiographic studies, pulse oximetry and  re-evaluation of patient's condition.   Length of Stay: 3  Loralie Champagne, MD  06/07/2019, 8:12 AM  Advanced Heart Failure Team Pager [phone] (M-F; 7a - 4p)  Please contact [facility] for night-coverage after hours (4p -7a ) and weekends on [email or web address]

## 2019-06-07 NOTE — Consult Note (Signed)
[facility] Renal Consultation Note  Requesting MD:  Indication for Consultation: Acute on chronic renal insufficiency, maintenance of euvolemia, assessment and treatment of electrolyte disorders, assessment and treatment of acid-base disorders.  HPI:  Tammy Mathews is a 69 y.o. female.  With a history of breast cancer, obesity hypertension paroxysmal atrial fibrillation, hyperlipidemia, COPD, chronic respiratory failure on 2 L of oxygen stage III chronic kidney disease and pulmonary hypertension.  In 6/20 she was diagnosed with severe pulmonary artery hypertension and worsening renal failure with poor response to diuretics.  2D echo showed a serological work-up that was unremarkable with negative ANA, RF, anti-SCL 70, anticentromere and ANCA all negative.  VQ scan was negative for chronic pulmonary emboli high-resolution CT scan showed no evidence of ILD.  Her PA pressures at that time were 80/34 with a mean of 53.  She [facility] with unresponsiveness and required 15 minutes CPR with ROSC.  There was concern for pulmonary embolus and she was placed on heparin drip.  She was transferred to [facility] for management of her pulmonary hypertension.  Her baseline serum creatinine appears to be about 1.3 mg/dL but her lowest creatinine on 12/30/2018 was 1.14 mg/dL.  06/05/2019 her creatinine was 3.05 mg/dL.  This is increased to 4.0 mg/dL.  Urine output has been adequate 06/05/2018 she had a urine output of 790 cc her weight is increased since admission from 123.4 kg - 125.4 kg   Home medications amiodarone 200 mg daily DuoNeb inhaler every 4 hours as needed, Levemir 26 units nightly, potassium chloride 20 mEq daily, spironolactone 25 mg daily Flomax 0.4 mg daily, albuterol 2 puffs every 4 hours as needed, ambrisentan 10 mg daily, Arimidex 1 mg daily aspirin 81 mg daily Urecholine 5 mg twice daily, Pulmicort nebulizer every 6 hours, Symbicort 2  puffs every 12 hours, fluticasone 2 sprays each nostril daily, torsemide 20 mg twice daily.  Blood pressure 06/01/1946 pulse 79 temperature 94.3 O2 sats 90% 6 L nasal cannula  Sodium 126 potassium 5.6 chloride 87 CO2 25 BUN 106 creatinine 4.07 glucose 132 calcium 8.1 WBC 8.7 hemoglobin 8.2 platelets 176.  Phosphorus 6.3 06/05/2019 magnesium 2.6 06/05/2019 and liver enzymes AST 47 06/05/2019 ALT 31 06/05/2019.  Total bilirubin 1.3  Urinalysis urine sodium less than 10.   Ambrisentan 10 mg daily, amiodarone 200 mg daily, aspirin 81 mg daily Urecholine 5 mg twice daily, iron sulfate 325 mg daily fluticasone 2 sprays daily, insulin sliding scale, Lantus 5 units daily, melatonin 4.5 mg daily Lokelma 10 g twice daily  IV dobutamine IV Lasix drip 12 mg/h IV Levophed  Chest x-ray low lung volumes with bilateral lung scarring and atelectasis CT scan bibasilar consolidations small pleural effusions main pulmonary artery is enlarged measuring 4.2 cm coronary artery disease, spiculated mass left axilla containing a biopsy clip, contour of liver and splenomegaly suggestive hepatic cirrhosis.  Creatinine, Ser  Date/Time Value Ref Range Status  06/07/2019 05:08 AM 4.07 (H) 0.44 - 1.00 mg/dL Final  06/06/2019 06:27 PM 3.82 (H) 0.44 - 1.00 mg/dL Final  06/06/2019 02:05 PM 3.82 (H) 0.44 - 1.00 mg/dL Final  06/06/2019 12:56 AM 3.54 (H) 0.44 - 1.00 mg/dL Final  06/05/2019 02:16 AM 3.05 (H) 0.44 - 1.00 mg/dL Final  12/30/2018 02:31 PM 1.14 (H) 0.44 - 1.00 mg/dL Final  11/28/2018 01:17 PM 1.65 (H) 0.44 - 1.00 mg/dL Final  11/08/2018 04:30 AM 1.33 (H) 0.44 - 1.00 mg/dL Final  11/07/2018 04:47 AM 1.36 (H)  0.44 - 1.00 mg/dL Final  11/06/2018 04:01 AM 1.30 (H) 0.44 - 1.00 mg/dL Final  11/05/2018 05:53 AM 1.35 (H) 0.44 - 1.00 mg/dL Final  11/04/2018 03:38 AM 1.42 (H) 0.44 - 1.00 mg/dL Final  11/03/2018 07:00 AM 1.59 (H) 0.44 - 1.00 mg/dL Final  11/02/2018 07:03 AM 1.78 (H) 0.44 - 1.00 mg/dL Final  11/01/2018 07:10  AM 1.90 (H) 0.44 - 1.00 mg/dL Final  10/31/2018 02:53 AM 2.05 (H) 0.44 - 1.00 mg/dL Final  10/30/2018 03:11 AM 2.56 (H) 0.44 - 1.00 mg/dL Final  10/29/2018 02:37 PM 3.27 (H) 0.44 - 1.00 mg/dL Final  10/29/2018 06:58 AM 2.97 (H) 0.44 - 1.00 mg/dL Final  10/28/2018 02:20 PM 3.34 (H) 0.44 - 1.00 mg/dL Final  10/28/2018 06:31 AM 3.71 (H) 0.44 - 1.00 mg/dL Final  10/27/2018 10:49 PM 3.65 (H) 0.44 - 1.00 mg/dL Final  10/27/2018 12:27 PM 3.73 (H) 0.44 - 1.00 mg/dL Final  06/01/2017 02:12 PM 1.27 (H) 0.40 - 1.20 mg/dL Final  04/05/2017 05:10 PM 1.18 0.40 - 1.20 mg/dL Final     PMHx:   Past Medical History:  Diagnosis Date  . Breast cancer ([place])   . COPD (chronic obstructive pulmonary disease) ([place])   . Diabetes mellitus type 2 in obese ([place])   . HLD (hyperlipidemia)   . HTN (hypertension)   . Morbid obesity ([place])     Past Surgical History:  Procedure Laterality Date  . BREAST SURGERY    . HERNIA REPAIR    . RIGHT HEART CATH N/A 11/06/2018   Procedure: RIGHT HEART CATH;  Surgeon: Larey Dresser, MD;  Location: [facility];  Service: Cardiovascular;  Laterality: N/A;    Family Hx:  Family History  Problem Relation Age of Onset  . Heart disease Mother   . Colon cancer Mother   . Breast cancer Mother   . Emphysema Father        smoked  . Emphysema Brother        smoked    Social History:  reports that she has never smoked. She has never used smokeless tobacco. She reports that she does not drink alcohol or use drugs.  Allergies:  Allergies  Allergen Reactions  . Codeine Nausea And Vomiting and Other (See Comments)    Unknown   . Morphine And Related Nausea And Vomiting    Medications: Prior to Admission medications   Medication Sig Start Date End Date Taking? Authorizing Provider  acetaminophen (TYLENOL) 500 MG tablet Take 1,000 mg by mouth every 8 (eight) hours as needed for mild pain.    Yes [provider]  albuterol (PROVENTIL HFA;VENTOLIN HFA) 108  (90 Base) MCG/ACT inhaler Inhale 2 puffs into the lungs every 4 (four) hours as needed for wheezing or shortness of breath.    Yes [provider]  ambrisentan (LETAIRIS) 10 MG tablet Take 1 tablet (10 mg total) by mouth daily. 04/18/19  Yes Larey Dresser, MD  amiodarone (PACERONE) 200 MG tablet Take 200 mg by mouth daily. 05/29/19  Yes [provider]  anastrozole (ARIMIDEX) 1 MG tablet Take 1 mg by mouth daily.   Yes [provider]  aspirin EC 81 MG tablet Take 81 mg by mouth daily.   Yes [provider]  bethanechol (URECHOLINE) 5 MG tablet Take 5 mg by mouth 2 (two) times daily. 05/30/19  Yes [provider]  budesonide (PULMICORT) 0.5 MG/2ML nebulizer solution Inhale 2 mLs into the lungs every 6 (six) hours as  needed for shortness of breath. 06/23/18  Yes [provider]  budesonide-formoterol (SYMBICORT) 80-4.5 MCG/ACT inhaler Take 2 puffs first thing in am and then another 2 puffs about 12 hours later. 06/01/17  Yes Tanda Rockers, MD  cefTRIAXone (ROCEPHIN) 1 g injection Inject 1 g into the muscle daily. For 5 Days 06/03/2019  Yes [provider]  docusate sodium (COLACE) 100 MG capsule Take 100 mg by mouth at bedtime.   Yes [provider]  ferrous sulfate 325 (65 FE) MG tablet Take 325 mg by mouth daily with breakfast.   Yes [provider]  fluticasone (FLONASE) 50 MCG/ACT nasal spray Place 2 sprays into both nostrils daily. 05/23/19  Yes [provider]  guaifenesin (HUMIBID E) 400 MG TABS tablet Take 400 mg by mouth 2 (two) times daily. For 14 days 06/07/2019  Yes [provider]  ipratropium-albuterol (DUONEB) 0.5-2.5 (3) MG/3ML SOLN Take 3 mLs by nebulization every 6 (six) hours. X 5 days 05/16/2019  Yes [provider]  ipratropium-albuterol (DUONEB) 0.5-2.5 (3) MG/3ML SOLN Take 3 mLs by nebulization every 4 (four) hours as needed (shortness of breath).   Yes [provider]   LEVEMIR FLEXTOUCH 100 UNIT/ML Pen Inject 26 Units into the skin at bedtime. 05/19/19  Yes [provider]  loperamide (IMODIUM A-D) 2 MG tablet Take 2 mg by mouth 4 (four) times daily as needed for diarrhea or loose stools.   Yes [provider]  loratadine (CLARITIN) 10 MG tablet Take 10 mg by mouth daily as needed for allergies.   Yes [provider]  meclizine (ANTIVERT) 25 MG tablet Take 25 mg by mouth every 6 (six) hours as needed for dizziness.    Yes [provider]  Melatonin 5 MG TABS Take 5 mg by mouth at bedtime.   Yes [provider]  Menthol, Topical Analgesic, (BIOFREEZE) 4 % GEL Apply 1 application topically 2 (two) times daily. For left shoulder pain   Yes [provider]  Morphine Sulfate (MORPHINE CONCENTRATE) 10 mg / 0.5 ml concentrated solution Take 0.25 mLs by mouth every 2 (two) hours as needed for shortness of breath. 05/29/19  Yes [provider]  nitrofurantoin, macrocrystal-monohydrate, (MACROBID) 100 MG capsule Take 100 mg by mouth 2 (two) times daily. 06/03/19  Yes [provider]  ondansetron (ZOFRAN) 4 MG tablet Take 4 mg by mouth every 6 (six) hours as needed for nausea or vomiting.   Yes [provider]  OXYGEN 3 L continuous. [facility]   Yes [provider]  potassium chloride SA (KLOR-CON) 20 MEQ tablet Take 20 mEq by mouth daily. 05/23/19  Yes [provider]  senna (SENOKOT) 8.6 MG TABS tablet Take 1 tablet by mouth every 12 (twelve) hours as needed for mild constipation.   Yes [provider]  spironolactone (ALDACTONE) 25 MG tablet Take 25 mg by mouth daily. X 7 days 06/08/2019  Yes [provider]  tamsulosin (FLOMAX) 0.4 MG CAPS capsule Take 0.4 mg by mouth daily. 05/27/19  Yes [provider]  torsemide (DEMADEX) 20 MG tablet Take 1 tablet (20 mg total) by mouth daily. Please cancel all previous orders for current medication. Change in dosage or pill  size. Patient taking differently: Take 20 mg by mouth 2 (two) times daily.  12/05/18 06/06/19 Yes Clegg, Amy D, NP  Vitamin D, Ergocalciferol, (DRISDOL) 1.25 MG (50000 UNIT) CAPS capsule Take 50,000 Units by mouth every 7 (seven) days. Thursday   Yes [provider]  ACCU-CHEK AVIVA PLUS test strip  02/06/18   [provider]  insulin aspart (NOVOLOG) 100 UNIT/ML injection Sliding scale for glucose:  CBG < 70: Implement Hypoglycemia Protocol  CBG 70 - 120: 0 units  CBG 121 - 150: 1 unit  CBG 151 - 200: 2 units  CBG 201 - 250: 3 units  CBG 251 - 300: 5 units  CBG 301 - 350: 7 units  CBG 351 - 400 9 units  CBG > 400 Notify facility MD Patient not taking: Reported on 06/06/2019 11/08/18   Donne Hazel, MD  Insulin Glargine (LANTUS SOLOSTAR) 100 UNIT/ML Solostar Pen Inject 8 Units into the skin at bedtime.    [provider]     Labs:  Results for orders placed or performed during the hospital encounter of 05/16/2019 (from the past 48 hour(s))  Glucose, capillary     Status: Abnormal   Collection Time: 06/05/19  8:49 AM  Result Value Ref Range   Glucose-Capillary 154 (H) 70 - 99 mg/dL  Heparin level (unfractionated)     Status: Abnormal   Collection Time: 06/05/19 12:00 PM  Result Value Ref Range   Heparin Unfractionated 0.23 (L) 0.30 - 0.70 IU/mL    Comment: (NOTE) If heparin results are below expected values, and patient dosage has  been confirmed, suggest follow up testing of antithrombin III levels. Performed at [facility], [place] [address], [place], [place] [address]   Glucose, capillary     Status: Abnormal   Collection Time: 06/05/19  1:32 PM  Result Value Ref Range   Glucose-Capillary 125 (H) 70 - 99 mg/dL   Comment 1 Notify RN   Glucose, capillary     Status: Abnormal   Collection Time: 06/05/19  4:26 PM  Result Value Ref Range   Glucose-Capillary 146 (H) 70 - 99 mg/dL  Glucose, capillary     Status: Abnormal   Collection Time: 06/05/19   7:59 PM  Result Value Ref Range   Glucose-Capillary 199 (H) 70 - 99 mg/dL  Glucose, capillary     Status: Abnormal   Collection Time: 06/05/19 11:50 PM  Result Value Ref Range   Glucose-Capillary 147 (H) 70 - 99 mg/dL  Heparin level (unfractionated)     Status: None   Collection Time: 06/06/19 12:56 AM  Result Value Ref Range   Heparin Unfractionated 0.45 0.30 - 0.70 IU/mL    Comment: (NOTE) If heparin results are below expected values, and patient dosage has  been confirmed, suggest follow up testing of antithrombin III levels. Performed at [facility], [place] [address], [place], [place] [address]   CBC     Status: Abnormal   Collection Time: 06/06/19 12:56 AM  Result Value Ref Range   WBC 9.2 4.0 - 10.5 K/uL   RBC 3.31 (L) 3.87 - 5.11 MIL/uL   Hemoglobin 8.1 (L) 12.0 - 15.0 g/dL   HCT 27.1 (L) 36.0 - 46.0 %   MCV 81.9 80.0 - 100.0 fL   MCH 24.5 (L) 26.0 - 34.0 pg   MCHC 29.9 (L) 30.0 - 36.0 g/dL   RDW 16.0 (H) 11.5 - 15.5 %   Platelets 222 150 - 400 K/uL   nRBC 0.0 0.0 - 0.2 %    Comment: Performed at [facility], [place] [address], [place], [place] [address]  Basic metabolic panel     Status: Abnormal   Collection Time: 06/06/19 12:56 AM  Result Value Ref Range  Sodium 130 (L) 135 - 145 mmol/L   Potassium 5.9 (H) 3.5 - 5.1 mmol/L   Chloride 89 (L) 98 - 111 mmol/L   CO2 25 22 - 32 mmol/L   Glucose, Bld 159 (H) 70 - 99 mg/dL   BUN 97 (H) 8 - 23 mg/dL   Creatinine, Ser 3.54 (H) 0.44 - 1.00 mg/dL   Calcium 8.4 (L) 8.9 - 10.3 mg/dL   GFR calc non Af Amer 12 (L) >60 mL/min   GFR calc Af Amer 14 (L) >60 mL/min   Anion gap 16 (H) 5 - 15    Comment: Performed at [facility] [address], [place], [place] [address]  Glucose, capillary     Status: Abnormal   Collection Time: 06/06/19  3:41 AM  Result Value Ref Range   Glucose-Capillary 146 (H) 70 - 99 mg/dL  Heparin level (unfractionated)     Status: None   Collection Time: 06/06/19  3:51 AM  Result  Value Ref Range   Heparin Unfractionated 0.44 0.30 - 0.70 IU/mL    Comment: (NOTE) If heparin results are below expected values, and patient dosage has  been confirmed, suggest follow up testing of antithrombin III levels. Performed at [facility], [place] [address] [place], [place] [address]   Glucose, capillary     Status: Abnormal   Collection Time: 06/06/19  8:38 AM  Result Value Ref Range   Glucose-Capillary 141 (H) 70 - 99 mg/dL  Sodium, urine, random     Status: None   Collection Time: 06/06/19  9:57 AM  Result Value Ref Range   Sodium, Ur <10 mmol/L    Comment: Performed at [facility], [place] [address], [place], [place] [address]  Osmolality, urine     Status: None   Collection Time: 06/06/19  9:57 AM  Result Value Ref Range   Osmolality, Ur 335 300 - 900 mOsm/kg    Comment: Performed at [facility] [address], [place], [place] [address]  Glucose, capillary     Status: Abnormal   Collection Time: 06/06/19 12:35 PM  Result Value Ref Range   Glucose-Capillary 135 (H) 70 - 99 mg/dL  Basic metabolic panel     Status: Abnormal   Collection Time: 06/06/19  2:05 PM  Result Value Ref Range   Sodium 127 (L) 135 - 145 mmol/L   Potassium 6.0 (H) 3.5 - 5.1 mmol/L   Chloride 88 (L) 98 - 111 mmol/L   CO2 23 22 - 32 mmol/L   Glucose, Bld 107 (H) 70 - 99 mg/dL   BUN 104 (H) 8 - 23 mg/dL   Creatinine, Ser 3.82 (H) 0.44 - 1.00 mg/dL   Calcium 8.5 (L) 8.9 - 10.3 mg/dL   GFR calc non Af Amer 11 (L) >60 mL/min   GFR calc Af Amer 13 (L) >60 mL/min   Anion gap 16 (H) 5 - 15    Comment: Performed at [facility] [address], [place], [place] [address]  Glucose, capillary     Status: Abnormal   Collection Time: 06/06/19  3:00 PM  Result Value Ref Range   Glucose-Capillary 108 (H) 70 - 99 mg/dL  .Cooxemetry Panel (carboxy, met, total hgb, O2 sat)     Status: Abnormal   Collection Time: 06/06/19  6:06 PM  Result Value Ref Range   Total hemoglobin 7.6 (L)  12.0 - 16.0 g/dL   O2 Saturation 89.3 %   Carboxyhemoglobin  1.5 0.5 - 1.5 %   Methemoglobin 0.8 0.0 - 1.5 %    Comment: Performed at [facility] [address] [place], [place] [address]  Basic metabolic panel     Status: Abnormal   Collection Time: 06/06/19  6:27 PM  Result Value Ref Range   Sodium 128 (L) 135 - 145 mmol/L   Potassium 5.7 (H) 3.5 - 5.1 mmol/L   Chloride 89 (L) 98 - 111 mmol/L   CO2 26 22 - 32 mmol/L   Glucose, Bld 113 (H) 70 - 99 mg/dL   BUN 103 (H) 8 - 23 mg/dL   Creatinine, Ser 3.82 (H) 0.44 - 1.00 mg/dL   Calcium 8.3 (L) 8.9 - 10.3 mg/dL   GFR calc non Af Amer 11 (L) >60 mL/min   GFR calc Af Amer 13 (L) >60 mL/min   Anion gap 13 5 - 15    Comment: Performed at [facility] [address], [place], [place] [address]  Glucose, capillary     Status: Abnormal   Collection Time: 06/06/19  8:04 PM  Result Value Ref Range   Glucose-Capillary 142 (H) 70 - 99 mg/dL  Glucose, capillary     Status: Abnormal   Collection Time: 06/07/19 12:04 AM  Result Value Ref Range   Glucose-Capillary 125 (H) 70 - 99 mg/dL  Glucose, capillary     Status: Abnormal   Collection Time: 06/07/19  3:54 AM  Result Value Ref Range   Glucose-Capillary 153 (H) 70 - 99 mg/dL  CBC     Status: Abnormal   Collection Time: 06/07/19  5:08 AM  Result Value Ref Range   WBC 8.7 4.0 - 10.5 K/uL   RBC 3.35 (L) 3.87 - 5.11 MIL/uL   Hemoglobin 8.2 (L) 12.0 - 15.0 g/dL   HCT 28.1 (L) 36.0 - 46.0 %   MCV 83.9 80.0 - 100.0 fL   MCH 24.5 (L) 26.0 - 34.0 pg   MCHC 29.2 (L) 30.0 - 36.0 g/dL   RDW 16.1 (H) 11.5 - 15.5 %   Platelets 176 150 - 400 K/uL   nRBC 0.2 0.0 - 0.2 %    Comment: Performed at [facility], [place] [address] [place] [address] [place], [place] [address]  Basic metabolic panel     Status: Abnormal   Collection Time: 06/07/19  5:08 AM  Result Value Ref Range   Sodium 126 (L) 135 - 145 mmol/L   Potassium 5.6 (H) 3.5 - 5.1 mmol/L   Chloride 87 (L) 98 - 111 mmol/L   CO2 25 22 - 32  mmol/L   Glucose, Bld 132 (H) 70 - 99 mg/dL   BUN 106 (H) 8 - 23 mg/dL   Creatinine, Ser 4.07 (H) 0.44 - 1.00 mg/dL   Calcium 8.1 (L) 8.9 - 10.3 mg/dL   GFR calc non Af Amer 10 (L) >60 mL/min   GFR calc Af Amer 12 (L) >60 mL/min   Anion gap 14 5 - 15    Comment: Performed at [facility] [address] [place], [place] [address]     ROS: General weakness fatigue no fever sweats chills Eyes no visual complaints Ears nose mouth throat no hearing loss epistaxis sore throat Cardiovascular orthopnea shortness of breath on exertion peripheral edema present Pulmonary no cough wheeze hemoptysis GI no vomiting diarrhea abdominal pain GU no hematuria dysuria nocturia Neurologic no headache double vision blurred vision no weakness dysarthria dysphagia dysphonia Endocrine diabetes no history of  thyroid abnormalities Dermatologic no skin rash or itching Psychiatric positive for anxiety     Physical Exam: Vitals:   06/07/19 0615 06/07/19 0630  BP: (!) 119/47 (!) 125/46  Pulse: 80 79  Resp: (!) 27 19  Temp:    SpO2: (!) 89% 92%     General: Short of breath using oxygen HEENT: Normocephalic atraumatic Eyes: No icterus no pallor Neck: Elevated JVP carotids 2+ bilateral no lymphadenopathy or thyromegaly Heart: Regular rate and rhythm no murmurs rubs gallops right upper chest wall Port-A-Cath Lungs: Clear to auscultation 5 L nasal cannula Abdomen: Soft nontender bowel sounds present organosplenomegaly Extremities: No cyanosis clubbing or rash 2-3+ pitting edema with dressings in place Skin: No skin rashes or lesions noted Neuro: Alert nondistressed moving all 4 extremities nonfocal  Assessment/Plan: 1.Renal-acute on chronic renal insufficiency baseline serum creatinine does appear to be at 1.1 2020.  There has been progressive rise of her creatinine since admission.  She has been placed on IV diuretics but poorly responsive.  She developed some systemic hypotension that is  concerning especially in the setting of worsening shortness of breath.  She is now requiring IV pressors.  We will obtain a renal ultrasound to rule out obstruction.  We will send off urinalysis.  Urine sodium is less than 10 in the setting of intravascular depletion.  Now requiring pressors.  I doubt this represents acute interstitial nephritis or an acute glomerulonephritis but is the result of systemic hypotension and poor renal perfusion.  Question would be with place patient on continuous renal replacement therapy or transition patient to comfort care. 2. Hypertension/volume  -massive volume overload with poor response to intravenous Lasix.  May benefit from CRRT.  Would suggest discussion with primary team regarding the benefits of CRRT versus transition to comfort care 3.  Diabetes mellitus as per primary service 4.  Anemia we will send off iron studies 5.  History of breast cancer status postmastectomy left side status post biopsy followed by Dr. Hinton Rao in [place] every 3 months 6.  Paroxysmal atrial fibrillation patient on amiodarone 7.  Pulmonary hypertension severe elevated pressures with negative serological work-up in June 2020.  This appears to be primary pulmonary hypertension  Prognosis appears to be guarded at this point.   Sherril Croon 06/07/2019, 6:54 AM

## 2019-06-07 NOTE — Progress Notes (Signed)
Patient incredibly anxious, stating she cannot breathe, screaming from her room and agitated. Elink MD notified, orders for sedation and Bipap received. Patient currently stable and vital signs within normal limits.   Will continue to monitor.   Weylin Plagge E Reola Mosher, [place]

## 2019-06-07 NOTE — Procedures (Signed)
Arterial Catheter Insertion Procedure Note Clodagh Odenthal [id number] 02/28/1951  Procedure: Insertion of Arterial Catheter  Indications: Blood pressure monitoring  Procedure Details Consent: Risks of procedure as well as the alternatives and risks of each were explained to the (patient/caregiver).  Consent for procedure obtained. Time Out: Verified patient identification, verified procedure, site/side was marked, verified correct patient position, special equipment/implants available, medications/allergies/relevent history reviewed, required imaging and test results available.  Performed  Maximum sterile technique was used including antiseptics, cap, gloves, gown, hand hygiene, mask and sheet. Skin prep: Chlorhexidine;  20 gauge catheter was inserted into right radial artery using the Seldinger technique. ULTRASOUND GUIDANCE USED: NO Evaluation Blood flow good; BP tracing good. Complications: No apparent complications.  Aline placed per MD order with Jean Rosenthal, RRT assisting. No complications. Initial BP of 116/35.    Irineo Axon [facility] 06/07/2019

## 2019-06-07 NOTE — Progress Notes (Signed)
eLink Physician-Brief Progress Note Patient Name: Tammy Mathews DOB: [birth year] MRN: [id number]   Date of Service  06/07/2019  HPI/Events of Note  Pt yelling she can't breathe.  She is being treated for CHF on Lasix gtt. She is saturating well on nasal cannula.  She is very anxious and moving around in bed.  eICU Interventions  Start on precedex. Trial on BIPAP.     Intervention Category Intermediate Interventions: Respiratory distress - evaluation and management  Elsie Lincoln 06/07/2019, 9:00 PM

## 2019-06-07 NOTE — Progress Notes (Signed)
Pharmacy Antibiotic Note  Tammy Mathews is a 69 y.o. female admitted on 05/24/2019 with UTI.  Pharmacy has been consulted for meropenem dosing.  Urine Culture from [employer] with multi-drug resistant Ecoli.  Due to resistance patterns, and shock, will treat aggressively for now per discussion with Dr. Lamonte Sakai.    Plan: Meropenem 1g IV q 12 hrs. F/u renal function, clinical course.   Height: 5' 2" (157.5 cm) Weight: 276 lb 7.3 oz (125.4 kg) IBW/kg (Calculated) : 50.1  Temp (24hrs), Avg:95.1 F (35.1 C), Min:93.7 F (34.3 C), Max:97.6 F (36.4 C)  Recent Labs  Lab 06/05/19 0216 06/06/19 0056 06/06/19 1405 06/06/19 1827 06/07/19 0508  WBC 6.8 9.2  --   --  8.7  CREATININE 3.05* 3.54* 3.82* 3.82* 4.07*    Estimated Creatinine Clearance: 16.3 mL/min (A) (by C-G formula based on SCr of 4.07 mg/dL (H)).    Allergies  Allergen Reactions  . Codeine Nausea And Vomiting and Other (See Comments)    Unknown   . Morphine And Related Nausea And Vomiting    Antimicrobials this admission: Meropenem 1/30 >   Dose adjustments this admission:  Microbiology results: 1/27 UCx > Ecoli - from [place]:        -Sensitive: ertapenem, nitrofurantoin, gentamicin, imipenem, fosfomycin         -Intermediate: tobramycin         -Resistant: TMP/SMX, Cipro, cefazolin, ceftriaxone,ampicillin, amp/sulbactam, cefepime, levofloxacin  Thank you for allowing pharmacy to be a part of this patient's care.  Marguerite Olea, [facility] Clinical Pharmacist Phone [phone]  06/07/2019 3:02 PM

## 2019-06-08 DIAGNOSIS — I2609 Other pulmonary embolism with acute cor pulmonale: Secondary | ICD-10-CM | POA: Diagnosis present

## 2019-06-08 DIAGNOSIS — Z515 Encounter for palliative care: Secondary | ICD-10-CM | POA: Insufficient documentation

## 2019-06-08 LAB — POCT I-STAT 7, (LYTES, BLD GAS, ICA,H+H)
Acid-base deficit: 2 mmol/L (ref 0.0–2.0)
Bicarbonate: 25.2 mmol/L (ref 20.0–28.0)
Calcium, Ion: 1.04 mmol/L — ABNORMAL LOW (ref 1.15–1.40)
HCT: 31 % — ABNORMAL LOW (ref 36.0–46.0)
Hemoglobin: 10.5 g/dL — ABNORMAL LOW (ref 12.0–15.0)
O2 Saturation: 98 %
Potassium: 5.3 mmol/L — ABNORMAL HIGH (ref 3.5–5.1)
Sodium: 128 mmol/L — ABNORMAL LOW (ref 135–145)
TCO2: 27 mmol/L (ref 22–32)
pCO2 arterial: 54.5 mmHg — ABNORMAL HIGH (ref 32.0–48.0)
pH, Arterial: 7.273 — ABNORMAL LOW (ref 7.350–7.450)
pO2, Arterial: 125 mmHg — ABNORMAL HIGH (ref 83.0–108.0)

## 2019-06-08 LAB — BASIC METABOLIC PANEL
Anion gap: 18 — ABNORMAL HIGH (ref 5–15)
BUN: 108 mg/dL — ABNORMAL HIGH (ref 8–23)
CO2: 23 mmol/L (ref 22–32)
Calcium: 8 mg/dL — ABNORMAL LOW (ref 8.9–10.3)
Chloride: 88 mmol/L — ABNORMAL LOW (ref 98–111)
Creatinine, Ser: 4.38 mg/dL — ABNORMAL HIGH (ref 0.44–1.00)
GFR calc Af Amer: 11 mL/min — ABNORMAL LOW (ref >60)
GFR calc non Af Amer: 10 mL/min — ABNORMAL LOW (ref >60)
Glucose, Bld: 104 mg/dL — ABNORMAL HIGH (ref 70–99)
Potassium: 5.3 mmol/L — ABNORMAL HIGH (ref 3.5–5.1)
Sodium: 129 mmol/L — ABNORMAL LOW (ref 135–145)

## 2019-06-08 LAB — CBC
HCT: 29.1 % — ABNORMAL LOW (ref 36.0–46.0)
Hemoglobin: 8.5 g/dL — ABNORMAL LOW (ref 12.0–15.0)
MCH: 24.4 pg — ABNORMAL LOW (ref 26.0–34.0)
MCHC: 29.2 g/dL — ABNORMAL LOW (ref 30.0–36.0)
MCV: 83.6 fL (ref 80.0–100.0)
Platelets: 191 10*3/uL (ref 150–400)
RBC: 3.48 MIL/uL — ABNORMAL LOW (ref 3.87–5.11)
RDW: 16.3 % — ABNORMAL HIGH (ref 11.5–15.5)
WBC: 9.4 10*3/uL (ref 4.0–10.5)
nRBC: 0.2 % (ref 0.0–0.2)

## 2019-06-08 LAB — GLUCOSE, CAPILLARY
Glucose-Capillary: 105 mg/dL — ABNORMAL HIGH (ref 70–99)
Glucose-Capillary: 120 mg/dL — ABNORMAL HIGH (ref 70–99)

## 2019-06-08 LAB — BRAIN NATRIURETIC PEPTIDE: B Natriuretic Peptide: 795.3 pg/mL — ABNORMAL HIGH (ref 0.0–100.0)

## 2019-06-08 MED ORDER — GLYCOPYRROLATE 0.2 MG/ML IJ SOLN
0.4000 mg | INTRAMUSCULAR | Status: DC
  Administered 2019-06-08 – 2019-06-09 (×3): 0.4 mg via INTRAVENOUS
  Filled 2019-06-08 (×3): qty 2

## 2019-06-08 MED ORDER — BISACODYL 10 MG RE SUPP: 10.0000 mg | Freq: Every day | RECTAL | Status: DC | PRN

## 2019-06-08 MED ORDER — BIOTENE DRY MOUTH MT LIQD: 15.0000 mL | OROMUCOSAL | Status: DC | PRN

## 2019-06-08 MED ORDER — ACETAMINOPHEN 325 MG PO TABS: 650.0000 mg | ORAL_TABLET | Freq: Four times a day (QID) | ORAL | Status: DC | PRN

## 2019-06-08 MED ORDER — GLYCOPYRROLATE 1 MG PO TABS
1.0000 mg | ORAL_TABLET | ORAL | Status: DC | PRN
  Filled 2019-06-08: qty 1

## 2019-06-08 MED ORDER — FENTANYL BOLUS VIA INFUSION
20.0000 ug | INTRAVENOUS | Status: DC | PRN
  Filled 2019-06-08: qty 20

## 2019-06-08 MED ORDER — FENTANYL 2500MCG IN NS 250ML (10MCG/ML) PREMIX INFUSION
20.0000 ug/h | INTRAVENOUS | Status: DC
  Administered 2019-06-08: 20 ug/h via INTRAVENOUS
  Filled 2019-06-08: qty 250

## 2019-06-08 MED ORDER — LORAZEPAM 2 MG/ML IJ SOLN: 0.5000 mg | INTRAMUSCULAR | Status: DC | PRN

## 2019-06-08 MED ORDER — FENTANYL BOLUS VIA INFUSION
100.0000 ug | INTRAVENOUS | Status: DC | PRN
  Administered 2019-06-08: 100 ug via INTRAVENOUS
  Filled 2019-06-08: qty 100

## 2019-06-08 MED ORDER — MIDAZOLAM 50MG/50ML (1MG/ML) PREMIX INFUSION
0.5000 mg/h | INTRAVENOUS | Status: DC
  Administered 2019-06-08: 0.5 mg/h via INTRAVENOUS
  Filled 2019-06-08 (×2): qty 50

## 2019-06-08 MED ORDER — DEXTROSE 5 % IV SOLN
250.0000 mg | Freq: Two times a day (BID) | INTRAVENOUS | Status: DC
  Administered 2019-06-08: 250 mg via INTRAVENOUS
  Filled 2019-06-08 (×2): qty 250

## 2019-06-08 MED ORDER — FENTANYL 2500MCG IN NS 250ML (10MCG/ML) PREMIX INFUSION
0.0000 ug/h | INTRAVENOUS | Status: DC
  Administered 2019-06-09: 150 ug/h via INTRAVENOUS
  Filled 2019-06-08: qty 250

## 2019-06-08 MED ORDER — DIPHENHYDRAMINE HCL 50 MG/ML IJ SOLN: 25.0000 mg | INTRAMUSCULAR | Status: DC | PRN

## 2019-06-08 MED ORDER — ACETAMINOPHEN 650 MG RE SUPP: 650.0000 mg | Freq: Four times a day (QID) | RECTAL | Status: DC | PRN

## 2019-06-08 MED ORDER — GLYCOPYRROLATE 0.2 MG/ML IJ SOLN: 0.2000 mg | INTRAMUSCULAR | Status: DC | PRN

## 2019-06-08 MED ORDER — DEXTROSE 5 % IV SOLN: INTRAVENOUS | Status: DC

## 2019-06-08 MED ORDER — POLYVINYL ALCOHOL 1.4 % OP SOLN
1.0000 [drp] | Freq: Four times a day (QID) | OPHTHALMIC | Status: DC | PRN
  Filled 2019-06-08: qty 15

## 2019-06-08 NOTE — Progress Notes (Signed)
Orthopedic Tech Progress Note Patient Details:  Tammy Mathews 1951-01-20 [id number] I waited until today to apply UNNA BOOTS because MD has a comment in order stating "he wants UNNA BOOTS to be changed twice a week , Sunday and Wednesday). Ortho Devices Type of Ortho Device: Haematologist, Ace wrap Ortho Device/Splint Location: bilateral legs Ortho Device/Splint Interventions: Application, Ordered   Post Interventions Patient Tolerated: Well Instructions Provided: Care of device, Adjustment of device   Janit Pagan 06/08/2019, 10:02 AM

## 2019-06-08 NOTE — Progress Notes (Signed)
Patient ID: Tammy Mathews, female   DOB: 11/13/1950, 69 y.o.   MRN: [id number]     Advanced Heart Failure Rounding Note  PCP-Cardiologist: No primary care provider on file.   Subjective:    Poor UOP, 503 cc, despite Lasix 20 mg/hr with metolazone 5 mg x 1.  She remains on dobutamine 2.5 + norepinephrine 15. CVP 28.   BUN/creatinine up to 108/4.38. K 5.3.   She is on Bipap, sedated but will awaken.    Objective:   Weight Range: 128 kg Body mass index is 51.61 kg/m.   Vital Signs:   Temp:  [94.4 F (34.7 C)-96.5 F (35.8 C)] 96.5 F (35.8 C) (01/31 0746) Pulse Rate:  [59-82] 66 (01/31 0746) Resp:  [12-29] 22 (01/31 0746) BP: (123)/(45) 123/45 (01/31 0530) SpO2:  [90 %-99 %] 97 % (01/31 0746) Arterial Line BP: (81-158)/(31-55) 108/40 (01/31 0600) FiO2 (%):  [40 %] 40 % (01/31 0746) Weight:  [128 kg] 128 kg (01/31 0432) Last BM Date: 06/07/2019  Weight change: Filed Weights   06/05/19 0500 06/07/19 0515 06/08/19 0432  Weight: 123.4 kg 125.4 kg 128 kg    Intake/Output:   Intake/Output Summary (Last 24 hours) at 06/08/2019 0832 Last data filed at 06/08/2019 0600 Gross per 24 hour  Intake 2294.54 ml  Output 460 ml  Net 1834.54 ml      Physical Exam    General: Chronically ill-appearing, on bipap Neck: Thick, JVP 16+, no thyromegaly or thyroid nodule.  Lungs: Crackles at bases. CV: Nonpalpable PMI.  Heart regular S1/S2, no S3/S4, 2/6 SEM RUSB with clear S2.  2+ edema to thighs.   Abdomen: Soft, nontender, no hepatosplenomegaly, mild distention.  Skin: Intact without lesions or rashes.  Neurologic: Sedated but awakens. Extremities: No clubbing or cyanosis.  HEENT: Normal.   Telemetry   NSR 80s (personally reviewed)  Labs    CBC Recent Labs    06/07/19 0508 06/07/19 0508 06/08/19 0431 06/08/19 0442  WBC 8.7  --  9.4  --   HGB 8.2*   < > 8.5* 10.5*  HCT 28.1*   < > 29.1* 31.0*  MCV 83.9  --  83.6  --   PLT 176  --  191  --    < > = values in this  interval not displayed.   Basic Metabolic Panel Recent Labs    06/07/19 1600 06/07/19 1600 06/08/19 0431 06/08/19 0442  NA 128*   < > 129* 128*  K 5.5*   < > 5.3* 5.3*  CL 89*  --  88*  --   CO2 23  --  23  --   GLUCOSE 128*  --  104*  --   BUN 109*  --  108*  --   CREATININE 4.43*  --  4.38*  --   CALCIUM 8.1*  --  8.0*  --    < > = values in this interval not displayed.   Liver Function Tests No results for input(s): AST, ALT, ALKPHOS, BILITOT, PROT, ALBUMIN in the last 72 hours. No results for input(s): LIPASE, AMYLASE in the last 72 hours. Cardiac Enzymes No results for input(s): CKTOTAL, CKMB, CKMBINDEX, TROPONINI in the last 72 hours.  BNP: BNP (last 3 results) Recent Labs    10/27/18 2210 06/08/19 0431  BNP 778.0* 795.3*    ProBNP (last 3 results) No results for input(s): PROBNP in the last 8760 hours.   D-Dimer No results for input(s): DDIMER in the last 72 hours. Hemoglobin A1C  No results for input(s): HGBA1C in the last 72 hours. Fasting Lipid Panel No results for input(s): CHOL, HDL, LDLCALC, TRIG, CHOLHDL, LDLDIRECT in the last 72 hours. Thyroid Function Tests No results for input(s): TSH, T4TOTAL, T3FREE, THYROIDAB in the last 72 hours.  Invalid input(s): FREET3  Other results:   Imaging    No results found.   Medications:     Scheduled Medications: . acetaminophen  650 mg Oral Q6H  . ambrisentan  10 mg Oral Daily  . amiodarone  200 mg Oral Daily  . aspirin  81 mg Oral Daily  . bethanechol  5 mg Oral BID  . Chlorhexidine Gluconate Cloth  6 each Topical Daily  . ferrous sulfate  325 mg Oral Q breakfast  . fluticasone  2 spray Each Nare Daily  . heparin injection (subcutaneous)  5,000 Units Subcutaneous Q8H  . insulin aspart  1-3 Units Subcutaneous Q4H  . insulin glargine  5 Units Subcutaneous Daily  . lidocaine  1 patch Transdermal Q24H  . mouth rinse  15 mL Mouth Rinse BID  . Melatonin  4.5 mg Oral QHS  . mometasone-formoterol   2 puff Inhalation BID  . pantoprazole  40 mg Oral Q1200  . senna  1 tablet Oral Daily  . sodium zirconium cyclosilicate  10 g Oral BID  . Vitamin D (Ergocalciferol)  50,000 Units Oral Q7 days    Infusions: . sodium chloride 10 mL/hr at 06/08/19 0600  . chlorothiazide (DIURIL) IV    . dexmedetomidine (PRECEDEX) IV infusion 0.6 mcg/kg/hr (06/08/19 0600)  . DOBUTamine 2.5 mcg/kg/min (06/08/19 0600)  . furosemide (LASIX) infusion 20 mg/hr (06/08/19 0600)  . meropenem (MERREM) IV 500 mg (06/07/19 2300)  . norepinephrine (LEVOPHED) Adult infusion 50 mcg/min (06/07/19 2046)    PRN Medications: Place/Maintain arterial line **AND** sodium chloride, ipratropium-albuterol, loratadine, ondansetron, [COMPLETED] polyethylene glycol **FOLLOWED BY** polyethylene glycol, traMADol    Assessment/Plan   1. Syncope: This could be due to severe pulmonary hypertension, no arrhythmias seen yet on telemetry.   2. Pulmonary hypertension/RV failure: Severe pulmonary HTN with RV failure. Suspect that she has a combination of group 1 and group 3 (from OSA/OSA) PH. Started ambrisentan 10 mg daily last year. She is markedly volume overloaded on exam, CVP 28 today. Poor response to diuresis with AKI and rising creatinine.  Now on dobutamine 2.5 + NE 15 with stable MAP.   - Continue ambrisentan for now.  - Increase lasix gtt to 25 mg/hr and will give Diuril 250 mg IV bid.  Per discussions with sister, will not do CVVH.  I do not think that she would tolerate CVVH and she would not be a long-term HD candidate.   - Continue dobutamine 2.5 mcg/kg/min for RV support.  Continue NE to maintain MAP.    3. Acute on chronic hypoxemic respiratory failure: OHS/OSA, now with likely pulmonary edema.  Afebrile, normal WBCs.  - She is now on Bipap.  - Continue attempt at diuresis.  4. AKI on CKD stage 3: BUN/creatinine rising, possible worsening due to hypotension at time of PEA arrest + RV failure. She is markedly volume  overloaded so attempting diuresis as above. Would be poor HD candidate and have decided not to do CVVH after discussion with her sister yesterday.  - Nephrology has seen.  5. Atrial fibrillation: Paroxysmal, NSR currently. Has refused anticoagulation in the past.  - Continue amiodarone 200 mg daily.  - Unlikely PE so CCM stopped heparin gtt. Getting heparin DVT prophylaxis.  6.  Hyperkalemia: K is lower today, repeat BMET in pm.   7. ID: UA with UTI.  - Meropenem started 1/30.   Increasing diuretic regimen as above.  If no response to this, will need to make transition to comfort care.  Palliative care service is following. DNR/DNI.   CRITICAL CARE Performed by: Loralie Champagne  Total critical care time: 35 minutes  Critical care time was exclusive of separately billable procedures and treating other patients.  Critical care was necessary to treat or prevent imminent or life-threatening deterioration.  Critical care was time spent personally by me on the following activities: development of treatment plan with patient and/or surrogate as well as nursing, discussions with consultants, evaluation of patient's response to treatment, examination of patient, obtaining history from patient or surrogate, ordering and performing treatments and interventions, ordering and review of laboratory studies, ordering and review of radiographic studies, pulse oximetry and re-evaluation of patient's condition.   Length of Stay: [place], MD  06/08/2019, 8:32 AM  Advanced Heart Failure Team Pager [phone] (M-F; 7a - 4p)  Please contact [facility] for night-coverage after hours (4p -7a ) and weekends on [email or web address]

## 2019-06-08 NOTE — Progress Notes (Signed)
06/08/19 2000  Clinical Encounter Type  Visited With Family  Visit Type Initial;Patient actively dying;Spiritual support  Referral From Other (Comment) (Financial controller)  Spiritual Encounters  Spiritual Needs Emotional;Grief support  Stress Factors  Patient Stress Factors Not reviewed  Family Stress Factors Major life changes;Loss of control;Exhausted   Met w/ 2 family member right before they left for the night.  Empathetic listening.  Temple Pacini [place], [phone]

## 2019-06-08 NOTE — Progress Notes (Signed)
CDS referral number:  [id number].  Spoke with April Shore.

## 2019-06-08 NOTE — Progress Notes (Signed)
[facility] ROUNDING NOTE   Subjective:   Tammy Mathews is a 69 y.o. female.  With a history of breast cancer, obesity hypertension paroxysmal atrial fibrillation, hyperlipidemia, COPD, chronic respiratory failure on 2 L of oxygen stage III chronic kidney disease and pulmonary hypertension.  In 6/20 she was diagnosed with severe pulmonary artery hypertension and worsening renal failure with poor response to diuretics.  2D echo showed a serological work-up that was unremarkable with negative ANA, RF, anti-SCL 70, anticentromere and ANCA all negative.  VQ scan was negative for chronic pulmonary emboli high-resolution CT scan showed no evidence of ILD.  Her PA pressures at that time were 80/34 with a mean of 53.  She presented to [facility]/05/2019 from SNF with unresponsiveness and required 15 minutes CPR with ROSC.  There was concern for pulmonary embolus and she was placed on heparin drip.  She was transferred to [facility] for management of her pulmonary hypertension.  Her baseline serum creatinine appears to be about 1.3 mg/dL but her lowest creatinine on 12/30/2018 was 1.14 mg/dL.  06/05/2019 her creatinine was 3.05 mg/dL.  This is increased to 4.0 mg/dL.  Urine output marginal at 380 cc 06/07/2019.   Blood pressure 57/45 pulse 74 temperature 96.5 O2 sats 96% FiO2 40% on BiPAP  Sodium 128 potassium 5.3 chloride 88 CO2 24 BUN 108 creatinine 4.38 glucose 104 calcium 8 hemoglobin 10.5  Urinalysis urine sodium less than 10.  Objective:  Vital signs in last 24 hours:  Temp:  [94.4 F (34.7 C)-96.5 F (35.8 C)] 96.5 F (35.8 C) (01/31 0746) Pulse Rate:  [59-82] 73 (01/31 1059) Resp:  [13-29] 23 (01/31 1059) BP: (60-123)/(43-49) 60/49 (01/31 0800) SpO2:  [90 %-99 %] 96 % (01/31 0900) Arterial Line BP: (60-158)/(31-63) 101/63 (01/31 0900) FiO2 (%):  [40 %] 40 % (01/31 1059) Weight:  [086 kg] 128 kg (01/31 0432)  Weight change: 2.6 kg Filed Weights   06/05/19  0500 06/07/19 0515 06/08/19 0432  Weight: 123.4 kg 125.4 kg 128 kg    Intake/Output: I/O last 3 completed shifts: In: 2890.9 [P.O.:360; I.V.:2342.8; IV Piggyback:188.1] Out: 653 [Urine:653]   Intake/Output this shift:  Total I/O In: 146.4 [I.V.:146.4] Out: 150 [Urine:150]  General: Short of breath using oxygen HEENT: Normocephalic atraumatic Eyes: No icterus no pallor Neck: Elevated JVP carotids 2+ bilateral no lymphadenopathy or thyromegaly Heart: Regular rate and rhythm no murmurs rubs gallops right upper chest wall Port-A-Cath Lungs: Clear to auscultation 5 L nasal cannula Abdomen: Soft nontender bowel sounds present organosplenomegaly Extremities: No cyanosis clubbing or rash 2-3+ pitting edema with dressings in place Skin: No skin rashes or lesions noted Neuro: Alert nondistressed moving all 4 extremities nonfocal   Basic Metabolic Panel: Recent Labs  Lab 06/05/19 0216 06/06/19 0056 06/06/19 1405 06/06/19 1405 06/06/19 1827 06/06/19 1827 06/07/19 0508 06/07/19 1600 06/08/19 0431 06/08/19 0442  NA 128*   < > 127*   < > 128*  --  126* 128* 129* 128*  K 6.0*   < > 6.0*   < > 5.7*  --  5.6* 5.5* 5.3* 5.3*  CL 92*   < > 88*  --  89*  --  87* 89* 88*  --   CO2 25   < > 23  --  26  --  _0 --   GLUCOSE 146*   < > 107*  --  113*  --  132* 128* 104*  --   BUN 83*   < >  104*  --  103*  --  106* 109* 108*  --   CREATININE 3.05*   < > 3.82*  --  3.82*  --  4.07* 4.43* 4.38*  --   CALCIUM 8.5*   < > 8.5*   < > 8.3*   < > 8.1* 8.1* 8.0*  --   MG 2.6*  --   --   --   --   --   --   --   --   --   PHOS 6.3*  --   --   --   --   --   --   --   --   --    < > = values in this interval not displayed.    Liver Function Tests: Recent Labs  Lab 06/05/19 0216  AST 47*  ALT 31  ALKPHOS 130*  BILITOT 1.3*  PROT 7.6  ALBUMIN 3.2*   No results for input(s): LIPASE, AMYLASE in the last 168 hours. No results for input(s): AMMONIA in the last 168 hours.  CBC: Recent  Labs  Lab 06/05/19 0216 06/06/19 0056 06/07/19 0508 06/08/19 0431 06/08/19 0442  WBC 6.8 9.2 8.7 9.4  --   HGB 8.2* 8.1* 8.2* 8.5* 10.5*  HCT 27.6* 27.1* 28.1* 29.1* 31.0*  MCV 81.9 81.9 83.9 83.6  --   PLT 202 222 176 191  --     Cardiac Enzymes: No results for input(s): CKTOTAL, CKMB, CKMBINDEX, TROPONINI in the last 168 hours.  BNP: Invalid input(s): POCBNP  CBG: Recent Labs  Lab 06/07/19 1605 06/07/19 2044 06/07/19 2340 06/08/19 0318 06/08/19 0803  GLUCAP 116* 125* 128* 105* 120*    Microbiology: Results for orders placed or performed during the hospital encounter of 05/09/2019  MRSA PCR Screening     Status: None   Collection Time: 06/01/2019 11:11 PM   Specimen: Nasal Mucosa; Nasopharyngeal  Result Value Ref Range Status   MRSA by PCR NEGATIVE NEGATIVE Final    Comment:        The GeneXpert MRSA Assay (FDA approved for NASAL specimens only), is one component of a comprehensive MRSA colonization surveillance program. It is not intended to diagnose MRSA infection nor to guide or monitor treatment for MRSA infections. Performed at [facility], [place] [address], [place], [place] [address]     Coagulation Studies: No results for input(s): LABPROT, INR in the last 72 hours.  Urinalysis: Recent Labs    06/07/19 0740  COLORURINE AMBER*  LABSPEC 1.010  PHURINE 5.0  GLUCOSEU NEGATIVE  HGBUR MODERATE*  BILIRUBINUR NEGATIVE  KETONESUR NEGATIVE  PROTEINUR 100*  NITRITE NEGATIVE  LEUKOCYTESUR LARGE*      Imaging: No results found.   Medications:   . fentaNYL infusion INTRAVENOUS    . midazolam     . acetaminophen  650 mg Oral Q6H  . glycopyrrolate  0.4 mg Intravenous Q4H   antiseptic oral rinse, bisacodyl, fentaNYL, polyvinyl alcohol  Assessment/ Plan:  1.Renal-acute on chronic renal insufficiency baseline serum creatinine does appear to be at 1.1 2020.  There has been progressive rise of her creatinine since admission.  She has been  placed on IV diuretics but poorly responsive.  She developed some systemic hypotension that is concerning especially in the setting of worsening shortness of breath.  She is now requiring IV pressors.  We will obtain a renal ultrasound to rule out obstruction.  We will send off urinalysis.  Urine sodium is less than 10 in the  setting of intravascular depletion.  Now requiring pressors.  I doubt this represents acute interstitial nephritis or an acute glomerulonephritis but is the result of systemic hypotension and poor renal perfusion.  Question would be with place patient on continuous renal replacement therapy or transition patient to comfort care.  It appears that we are not going to escalate care at this point. 2. Hypertension/volume  -massive volume overload with poor response to intravenous Lasix.   3.  Diabetes mellitus as per primary service 4.  Anemia we will send off iron studies 5.  History of breast cancer status postmastectomy left side status post biopsy followed by Dr. Hinton Rao in [place] every 3 months 6.  Paroxysmal atrial fibrillation patient on amiodarone 7.  Pulmonary hypertension severe elevated pressures with negative serological work-up in June 2020.  This appears to be primary pulmonary hypertension  Prognosis appears to be guarded at this point.  There is nothing more to be added from nephrology standpoint     LOS: [place] _0 _1 :35 AM

## 2019-06-08 NOTE — Progress Notes (Signed)
Palliative Medicine Inpatient Follow Up Note  HPI: 69 yoF with hx severe pulmonary hypertension, chronic RV failure, diastolic HF, chronic hypoxic respiratory failure on O2 and PAF not on AC presenting from SNF after sudden unresponsive episode. CPR performed by SNF. ROSC with EMS, since hemodynamically stable and neuro intact. Concern for PE in ER and empirically started on heparin. Transferred to [facility] for further evaluation.  Today's Discussion (06/08/2019): Chart reviewed. Consulted with patients bedside RN, Judson Roch and Intensitivst, Dr. Lamonte Sakai. Patient has not made improvements over the last 24 hours. Her kidney function has remained unstable, she is no longer producing urine. She has worsening respiratory function requiring intermittent bipap.   Called patients sister, [name] this morning. [name] and I discussed her present clinical state. The decision was made to switch the patient to a comfort emphasis. Given her worsening kidney and respiratory function she likely has very limited time left. She will be treated with comfort oriented medications such as opioids, benzodiazepines, and anticholinergics to maintain symptom relief. [name] is on her way to the hospital now.  Discussed  the importance of continued conversation with family and their medical providers regarding overall plan of care and treatment options, ensuring decisions are within the context of the patients values and GOCs.  Questions and concerns addressed   Vital Signs Vitals:   06/08/19 0800 06/08/19 0900  BP:    Pulse: 67 74  Resp: 16 (!) 27  Temp:    SpO2: 97% 96%    Intake/Output Summary (Last 24 hours) at 06/08/2019 1027 Last data filed at 06/08/2019 0900 Gross per 24 hour  Intake 2253.3 ml  Output 555 ml  Net 1698.3 ml   Last Weight  Most recent update: 06/08/2019  4:32 AM   Weight  128 kg (282 lb 3 oz)           Physical Exam Vitals and nursing note reviewed.  HENT:     Head: Normocephalic.   Nose: Nose normal.  Eyes:     Pupils: Pupils are equal, round, and reactive to light.  Cardiovascular:     Rate and Rhythm: Normal rate and regular rhythm.     Pulses: Normal pulses.  Pulmonary:     Effort: Respiratory distress present. On Bipap Abdominal:     Palpations: Abdomen is soft.  Musculoskeletal:        General: Normal range of motion.     Cervical back: Normal range of motion.  Skin:    General: Skin is dry.     Capillary Refill: Capillary refill takes less than 2 seconds.  Neurological:     Mental Status: She is alert.     Comments: Oriented to person, place, and situation  Psychiatric:        Attention and Perception: She is inattentive.   Recommendations and Plan: Dyspnea:  - Fentanyl gtt with boluses PRN Pain:  - Tylenol 691m PO/PR Q6H PRN  - Heat Pack PRN Fever:  - Tylenol as above Agitation: Anxiety:  - Versed gtt with titration PRN Nausea:  - Zofran 418mPO/IV Q6H PRN  Secretions:  - Glycopyrrolate 0.43m85mV Q4H ATC Dry Eyes:  - Artificial Tears PRN Xerostomia:  - Biotene 32m40mN  - BID oral care Urinary Retention:  - maintain foley  Constipation:  - Bisacodyl 10mg30mPRN QDay Spiritual:  - Chaplain consult  Discussed with Dr. ByrumLamonte Sakaie Spent:35 Greater than 50% of the time was spent in counseling and coordination of care ______________________________________________________________________________________ MicheTacey Ruiz  [place] Team Team Cell Phone: [phone] Please utilize secure chat with additional questions, if there is no response within 30 minutes please call the above phone number  Palliative Medicine Team providers are available by phone from 7am to 7pm daily and can be reached through the team cell phone.  Should this patient require assistance outside of these hours, please call the patient's attending physician.

## 2019-06-08 NOTE — Progress Notes (Signed)
Assisted tele visit to patient with family member.  Lenox Ahr, RN

## 2019-06-08 NOTE — Progress Notes (Addendum)
PULMONARY / CRITICAL CARE MEDICINE   NAME:  Tammy Mathews, MRN:  [id number], DOB:  1950/12/23, LOS: 4 ADMISSION DATE:  05/30/2019, CONSULTATION DATE:  05/15/2019 REFERRING MD:  [facility], CHIEF COMPLAINT:  Unresponsive  BRIEF HISTORY:    Ms. Malmberg is a 69 y.o immunocompromised female with pulmonary htn on ambrisentan, paf not on anticoagulation given prior severe epistaxis, breast cancer, osa, chronic rv failure, hfpef ef 60-65% per tte 6/20,ckd 3,  chronic hypoxic respiratory failure on o2 chronically who resides at [facility] and rehab in [place] presented for evaluation of what was thought to be a syncopal episode as the patient recalls chest compressions. Patient was noted to have had worsening dyspnea for the past 2-3 days, decreased ambulation, and with increased lower extremity swelling.   Patient being managed for syncopal episode that is thought to be in setting of acute on chronic hypoxic respiratory failure.   SIGNIFICANT PAST MEDICAL HISTORY   Has a past medical history of Breast cancer ([place]), COPD (chronic obstructive pulmonary disease) ([place]), Diabetes mellitus type 2 in obese ([place]), HLD (hyperlipidemia), HTN (hypertension), and Morbid obesity ([place]).  SIGNIFICANT EVENTS:  1/27 transferred from [place] to [facility] 1/29 Recurrent resp distress and failure requiring BiPAP  STUDIES:   1/27 CT chest ([facility])>> Mild to moderate severity areas of atelectasis and/or infiltrate within the bilateral upper lobes and right lower lobe.  Small left pleural effusion.  Mild cardiomegaly.   CULTURES:  1/27 SARS 2, flu a, b negative  1/27 urine culture pending   ANTIBIOTICS:  Ceftriaxone 1/27>1/27  LINES/TUBES:  Right antecubital peripheral IV 1/27 Right chest implanted port 6/30  CONSULTANTS:  Heart failure team  SUBJECTIVE:  Events from last 24 hours noted.  Progressive dyspnea with some associated agitation, started on Precedex last night as well as BiPAP. Norepinephrine uptitrated.   Remains on dobutamine, Lasix Also started meropenem 1/30 given multidrug-resistant E. coli results from [place] Greatly appreciate palliative care assistance, support for her family  CONSTITUTIONAL: BP (!) 102/43   Pulse 74   Temp (!) 96.5 F (35.8 C) (Axillary)   Resp (!) 27   Ht _0  (1.575 m)   Wt 128 kg   SpO2 96%   BMI 51.61 kg/m   I/O last 3 completed shifts: In: 2890.9 [P.O.:360; I.V.:2342.8; IV Piggyback:188.1] Out: 653 [Urine:653]  PHYSICAL EXAM: General: Ill-appearing obese woman on BiPAP Neuro: Somnolent, does wake to voice and attempt to converse, difficult to understand, poorly oriented, does move both upper extremities HEENT: BiPAP in place, no apparent oral lesions Cardiovascular: Distant, regular, no murmur.  Her chest wall is sore to palpation Lungs: Coarse bilaterally, no wheezing Abdomen: Obese, nondistended, positive bowel sounds Musculoskeletal: Venous stasis changes both lower extremities with some woody pretibial edema Skin: Venous stasis changes as mentioned, no rash  ASSESSMENT AND PLAN    Syncopal episode, suspect due to decompensated PAH with associated respiratory failure Initially on heparin for possible PE, stopped on 1/29 Regimen initiated to try to address decompensated PAH as below  Acute on chronic hypoxic respiratory failure due to OSA/OHS, COPD, PAH Severe Pulmonary hypertension, largely secondary to the above but also suspected to have group 1 disease, decompensated with associated cor pulmonale and shock.  Unfortunately worsening over the last 24 hours Remains on home ambrisentan Norepinephrine has been uptitrated, now 35.  She is on stable dobutamine, Lasix doses but hemodynamics are worse, no urine output.  Unfortunately the trend suggests that she likely will not survive this.  Appreciate  the advanced heart failure team assistance.  Planning for some additional metolazone to be given today.  As below, poor dialysis  candidate. Okay to continue BiPAP if helping with her work of breathing Stop Dulera, continue DuoNeb if needed  Chest wall tenderness  Patient states that she has constant chest wall pain that is worsened with deep breath. She has a bruised area in left upper chest subsequent to chest compressions. EKG 1/29 without ischemic changes  Supportive care, pain control  Acute on chronic renal failure Continues to worsen 1/31, now anuric.  Appreciate nephrology evaluation Discussed status, prognosis and plan of care with nephrology, heart failure team, patient's sister [place].  Do not believe that hemodialysis is going to impact her chances for meaningful survival.  She likely would not tolerate.  Confirmed in subsequent discussions with [facility] by me and also Palliative Care service. Plan to continue current level of support for now  MDR E. coli UTI [facility]sey) Meropenem was started on 1/30, discontinue now based on family plan to withdraw care  Atrial fibrillation  Patient is on amiodarone 232m qd which is being continued. The patient is only on asa for anticoagulation as had severe epistaxis episode.  Amiodarone, aspirin  Diabetes Mellitus Continue sliding-scale insulin as per protocol Continue Lantus 5 units daily.   Best Practice / Goals of Care / Disposition.   DVT PROPHYLAXIS: heparin sq SUP: pantoprazole NUTRITION: heart healthy MOBILITY: pt and ot  GOALS OF CARE: DNR FAMILY DISCUSSIONS: I discussed with the patient's sister [place] 1/30.  I have explained her current status, current interventions.  I recommended that she would not tolerate hemodialysis and that we should defer this.  Discussions extended by Palliative Care on 1/30 and today 1/31.  Family agrees that her care should not be escalated and in fact based on her worsening have elected to transition to comfort.  The patient's children are planning to come to the ICU for withdrawal of care.  CODE STATUS DNR  DISPOSITION  ICU  LABS  Glucose Recent Labs  Lab 06/07/19 1201 06/07/19 1605 06/07/19 2044 06/07/19 2340 06/08/19 0318 06/08/19 0803  GLUCAP 125* 116* 125* 128* 105* 120*    BMET Recent Labs  Lab 06/07/19 0508 06/07/19 0508 06/07/19 1600 06/08/19 0431 06/08/19 0442  NA 126*   < > 128* 129* 128*  K 5.6*   < > 5.5* 5.3* 5.3*  CL 87*  --  89* 88*  --   CO2 25  --  23 23  --   BUN 106*  --  109* 108*  --   CREATININE 4.07*  --  4.43* 4.38*  --   GLUCOSE 132*  --  128* 104*  --    < > = values in this interval not displayed.    Liver Enzymes Recent Labs  Lab 06/05/19 0216  AST 47*  ALT 31  ALKPHOS 130*  BILITOT 1.3*  ALBUMIN 3.2*    Electrolytes Recent Labs  Lab 06/05/19 0216 06/06/19 0056 06/07/19 0508 06/07/19 1600 06/08/19 0431  CALCIUM 8.5*   < > 8.1* 8.1* 8.0*  MG 2.6*  --   --   --   --   PHOS 6.3*  --   --   --   --    < > = values in this interval not displayed.    CBC Recent Labs  Lab 06/06/19 0056 06/06/19 0056 06/07/19 0508 06/08/19 0431 06/08/19 0442  WBC 9.2  --  8.7 9.4  --  HGB 8.1*   < > 8.2* 8.5* 10.5*  HCT 27.1*   < > 28.1* 29.1* 31.0*  PLT 222  --  176 191  --    < > = values in this interval not displayed.    ABG Recent Labs  Lab 06/05/19 0323 06/08/19 0442  PHART 7.334* 7.273*  PCO2ART 49.4* 54.5*  PO2ART 79.6* 125.0*    Coag's No results for input(s): APTT, INR in the last 168 hours.  Sepsis Markers Recent Labs  Lab 06/05/19 0216  PROCALCITON 0.20    Cardiac Enzymes No results for input(s): TROPONINI, PROBNP in the last 168 hours.   Independent CC time 32 minutes  Baltazar Apo, MD, PhD 06/08/2019, 10:37 AM [facility] [phone] or if no answer [phone]

## 2019-06-08 NOTE — Progress Notes (Signed)
Patient resting on BIPAP will attempt to give inhaler once patient is awake and off.

## 2019-06-08 NOTE — Consult Note (Deleted)
[facility] Renal Consultation Note  Requesting MD:  Indication for Consultation: Acute on chronic renal insufficiency, maintenance of euvolemia, assessment and treatment of electrolyte disorders, assessment and treatment of acid-base disorders.  HPI:  Tammy Mathews is a 69 y.o. female.  With a history of breast cancer, obesity hypertension paroxysmal atrial fibrillation, hyperlipidemia, COPD, chronic respiratory failure on 2 L of oxygen stage III chronic kidney disease and pulmonary hypertension.  In 6/20 she was diagnosed with severe pulmonary artery hypertension and worsening renal failure with poor response to diuretics.  2D echo showed a serological work-up that was unremarkable with negative ANA, RF, anti-SCL 70, anticentromere and ANCA all negative.  VQ scan was negative for chronic pulmonary emboli high-resolution CT scan showed no evidence of ILD.  Her PA pressures at that time were 80/34 with a mean of 53.  She presented to [facility]/05/2019 from SNF with unresponsiveness and required 15 minutes CPR with ROSC.  There was concern for pulmonary embolus and she was placed on heparin drip.  She was transferred to [facility] for management of her pulmonary hypertension.  Her baseline serum creatinine appears to be about 1.3 mg/dL but her lowest creatinine on 12/30/2018 was 1.14 mg/dL.  06/05/2019 her creatinine was 3.05 mg/dL.  This is increased to 4.0 mg/dL.  Urine output marginal at 380 cc 06/07/2019.   Blood pressure 57/45 pulse 74 temperature 96.5 O2 sats 96% FiO2 40% on BiPAP  Sodium 128 potassium 5.3 chloride 88 CO2 24 BUN 108 creatinine 4.38 glucose 104 calcium 8 hemoglobin 10.5  Urinalysis urine sodium less than 10.   Ambrisentan 10 mg daily, amiodarone 200 mg daily, aspirin 81 mg daily Urecholine 5 mg twice daily, iron sulfate 325 mg daily fluticasone 2 sprays daily, insulin sliding scale, Lantus 5 units daily, melatonin 4.5 mg daily Lokelma 10 g twice  daily, glargine 5 units daily  IV dobutamine IV Lasix drip 12 mg/h IV meropenem 500 mg every 12 hours  Chest x-ray low lung volumes with bilateral lung scarring and atelectasis CT scan bibasilar consolidations small pleural effusions main pulmonary artery is enlarged measuring 4.2 cm coronary artery disease, spiculated mass left axilla containing a biopsy clip, contour of liver and splenomegaly suggestive hepatic cirrhosis.  Creatinine, Ser  Date/Time Value Ref Range Status  06/08/2019 04:31 AM 4.38 (H) 0.44 - 1.00 mg/dL Final  06/07/2019 04:00 PM 4.43 (H) 0.44 - 1.00 mg/dL Final  06/07/2019 05:08 AM 4.07 (H) 0.44 - 1.00 mg/dL Final  06/06/2019 06:27 PM 3.82 (H) 0.44 - 1.00 mg/dL Final  06/06/2019 02:05 PM 3.82 (H) 0.44 - 1.00 mg/dL Final  06/06/2019 12:56 AM 3.54 (H) 0.44 - 1.00 mg/dL Final  06/05/2019 02:16 AM 3.05 (H) 0.44 - 1.00 mg/dL Final  12/30/2018 02:31 PM 1.14 (H) 0.44 - 1.00 mg/dL Final  11/28/2018 01:17 PM 1.65 (H) 0.44 - 1.00 mg/dL Final  11/08/2018 04:30 AM 1.33 (H) 0.44 - 1.00 mg/dL Final  11/07/2018 04:47 AM 1.36 (H) 0.44 - 1.00 mg/dL Final  11/06/2018 04:01 AM 1.30 (H) 0.44 - 1.00 mg/dL Final  11/05/2018 05:53 AM 1.35 (H) 0.44 - 1.00 mg/dL Final  11/04/2018 03:38 AM 1.42 (H) 0.44 - 1.00 mg/dL Final  11/03/2018 07:00 AM 1.59 (H) 0.44 - 1.00 mg/dL Final  11/02/2018 07:03 AM 1.78 (H) 0.44 - 1.00 mg/dL Final  11/01/2018 07:10 AM 1.90 (H) 0.44 - 1.00 mg/dL Final  10/31/2018 02:53 AM 2.05 (H) 0.44 - 1.00 mg/dL Final  10/30/2018 03:11 AM 2.56 (H) 0.44 -  1.00 mg/dL Final  10/29/2018 02:37 PM 3.27 (H) 0.44 - 1.00 mg/dL Final  10/29/2018 06:58 AM 2.97 (H) 0.44 - 1.00 mg/dL Final  10/28/2018 02:20 PM 3.34 (H) 0.44 - 1.00 mg/dL Final  10/28/2018 06:31 AM 3.71 (H) 0.44 - 1.00 mg/dL Final  10/27/2018 10:49 PM 3.65 (H) 0.44 - 1.00 mg/dL Final  10/27/2018 12:27 PM 3.73 (H) 0.44 - 1.00 mg/dL Final  06/01/2017 02:12 PM 1.27 (H) 0.40 - 1.20 mg/dL Final  04/05/2017 05:10 PM 1.18  0.40 - 1.20 mg/dL Final     PMHx:   Past Medical History:  Diagnosis Date  . Breast cancer ([place])   . COPD (chronic obstructive pulmonary disease) ([place])   . Diabetes mellitus type 2 in obese ([place])   . HLD (hyperlipidemia)   . HTN (hypertension)   . Morbid obesity ([place])     Past Surgical History:  Procedure Laterality Date  . BREAST SURGERY    . HERNIA REPAIR    . RIGHT HEART CATH N/A 11/06/2018   Procedure: RIGHT HEART CATH;  Surgeon: Larey Dresser, MD;  Location: [facility];  Service: Cardiovascular;  Laterality: N/A;    Family Hx:  Family History  Problem Relation Age of Onset  . Heart disease Mother   . Colon cancer Mother   . Breast cancer Mother   . Emphysema Father        smoked  . Emphysema Brother        smoked    Social History:  reports that she has never smoked. She has never used smokeless tobacco. She reports that she does not drink alcohol or use drugs.  Allergies:  Allergies  Allergen Reactions  . Codeine Nausea And Vomiting and Other (See Comments)    Unknown   . Morphine And Related Nausea And Vomiting    Medications: Prior to Admission medications   Medication Sig Start Date End Date Taking? Authorizing Provider  acetaminophen (TYLENOL) 500 MG tablet Take 1,000 mg by mouth every 8 (eight) hours as needed for mild pain.    Yes [provider]  albuterol (PROVENTIL HFA;VENTOLIN HFA) 108 (90 Base) MCG/ACT inhaler Inhale 2 puffs into the lungs every 4 (four) hours as needed for wheezing or shortness of breath.    Yes [provider]  ambrisentan (LETAIRIS) 10 MG tablet Take 1 tablet (10 mg total) by mouth daily. 04/18/19  Yes Larey Dresser, MD  amiodarone (PACERONE) 200 MG tablet Take 200 mg by mouth daily. 05/29/19  Yes [provider]  anastrozole (ARIMIDEX) 1 MG tablet Take 1 mg by mouth daily.   Yes [provider]  aspirin EC 81 MG tablet Take 81 mg by mouth daily.   Yes [provider]   bethanechol (URECHOLINE) 5 MG tablet Take 5 mg by mouth 2 (two) times daily. 05/30/19  Yes [provider]  budesonide (PULMICORT) 0.5 MG/2ML nebulizer solution Inhale 2 mLs into the lungs every 6 (six) hours as needed for shortness of breath. 06/23/18  Yes [provider]  budesonide-formoterol (SYMBICORT) 80-4.5 MCG/ACT inhaler Take 2 puffs first thing in am and then another 2 puffs about 12 hours later. 06/01/17  Yes Tanda Rockers, MD  cefTRIAXone (ROCEPHIN) 1 g injection Inject 1 g into the muscle daily. For 5 Days 05/29/2019  Yes [provider]  docusate sodium (COLACE) 100 MG capsule Take 100 mg by mouth at bedtime.   Yes [provider]  ferrous sulfate 325 (65 FE) MG tablet  Take 325 mg by mouth daily with breakfast.   Yes [provider]  fluticasone (FLONASE) 50 MCG/ACT nasal spray Place 2 sprays into both nostrils daily. 05/23/19  Yes [provider]  guaifenesin (HUMIBID E) 400 MG TABS tablet Take 400 mg by mouth 2 (two) times daily. For 14 days 05/21/2019  Yes [provider]  ipratropium-albuterol (DUONEB) 0.5-2.5 (3) MG/3ML SOLN Take 3 mLs by nebulization every 6 (six) hours. X 5 days 06/06/2019  Yes [provider]  ipratropium-albuterol (DUONEB) 0.5-2.5 (3) MG/3ML SOLN Take 3 mLs by nebulization every 4 (four) hours as needed (shortness of breath).   Yes [provider]  LEVEMIR FLEXTOUCH 100 UNIT/ML Pen Inject 26 Units into the skin at bedtime. 05/19/19  Yes [provider]  loperamide (IMODIUM A-D) 2 MG tablet Take 2 mg by mouth 4 (four) times daily as needed for diarrhea or loose stools.   Yes [provider]  loratadine (CLARITIN) 10 MG tablet Take 10 mg by mouth daily as needed for allergies.   Yes [provider]  meclizine (ANTIVERT) 25 MG tablet Take 25 mg by mouth every 6 (six) hours as needed for dizziness.    Yes [provider]  Melatonin 5 MG TABS Take 5 mg by mouth  at bedtime.   Yes [provider]  Menthol, Topical Analgesic, (BIOFREEZE) 4 % GEL Apply 1 application topically 2 (two) times daily. For left shoulder pain   Yes [provider]  Morphine Sulfate (MORPHINE CONCENTRATE) 10 mg / 0.5 ml concentrated solution Take 0.25 mLs by mouth every 2 (two) hours as needed for shortness of breath. 05/29/19  Yes [provider]  nitrofurantoin, macrocrystal-monohydrate, (MACROBID) 100 MG capsule Take 100 mg by mouth 2 (two) times daily. 06/03/19  Yes [provider]  ondansetron (ZOFRAN) 4 MG tablet Take 4 mg by mouth every 6 (six) hours as needed for nausea or vomiting.   Yes [provider]  OXYGEN 3 L continuous. [facility]   Yes [provider]  potassium chloride SA (KLOR-CON) 20 MEQ tablet Take 20 mEq by mouth daily. 05/23/19  Yes [provider]  senna (SENOKOT) 8.6 MG TABS tablet Take 1 tablet by mouth every 12 (twelve) hours as needed for mild constipation.   Yes [provider]  spironolactone (ALDACTONE) 25 MG tablet Take 25 mg by mouth daily. X 7 days 05/21/2019  Yes [provider]  tamsulosin (FLOMAX) 0.4 MG CAPS capsule Take 0.4 mg by mouth daily. 05/27/19  Yes [provider]  torsemide (DEMADEX) 20 MG tablet Take 1 tablet (20 mg total) by mouth daily. Please cancel all previous orders for current medication. Change in dosage or pill size. Patient taking differently: Take 20 mg by mouth 2 (two) times daily.  12/05/18 06/06/19 Yes Clegg, Amy D, NP  Vitamin D, Ergocalciferol, (DRISDOL) 1.25 MG (50000 UNIT) CAPS capsule Take 50,000 Units by mouth every 7 (seven) days. Thursday   Yes [provider]  ACCU-CHEK AVIVA PLUS test strip  02/06/18   [provider]  insulin aspart (NOVOLOG) 100 UNIT/ML injection Sliding scale for glucose:  CBG < 70: Implement Hypoglycemia Protocol  CBG 70 - 120: 0 units  CBG 121 - 150: 1 unit  CBG 151 - 200: 2 units  CBG 201 - 250: 3  units  CBG 251 - 300: 5 units  CBG 301 - 350: 7 units  CBG 351 - 400 9 units  CBG > 400 Notify facility MD Patient  not taking: Reported on 06/06/2019 11/08/18   Donne Hazel, MD  Insulin Glargine (LANTUS SOLOSTAR) 100 UNIT/ML Solostar Pen Inject 8 Units into the skin at bedtime.    [provider]     Labs:  Results for orders placed or performed during the hospital encounter of 05/22/2019 (from the past 48 hour(s))  Sodium, urine, random     Status: None   Collection Time: 06/06/19  9:57 AM  Result Value Ref Range   Sodium, Ur <10 mmol/L    Comment: Performed at [facility], [place] [address], [place], [place] [address]  Osmolality, urine     Status: None   Collection Time: 06/06/19  9:57 AM  Result Value Ref Range   Osmolality, Ur 335 300 - 900 mOsm/kg    Comment: Performed at [facility] [address] [place], [place] [address]  Glucose, capillary     Status: Abnormal   Collection Time: 06/06/19 12:35 PM  Result Value Ref Range   Glucose-Capillary 135 (H) 70 - 99 mg/dL  Basic metabolic panel     Status: Abnormal   Collection Time: 06/06/19  2:05 PM  Result Value Ref Range   Sodium 127 (L) 135 - 145 mmol/L   Potassium 6.0 (H) 3.5 - 5.1 mmol/L   Chloride 88 (L) 98 - 111 mmol/L   CO2 23 22 - 32 mmol/L   Glucose, Bld 107 (H) 70 - 99 mg/dL   BUN 104 (H) 8 - 23 mg/dL   Creatinine, Ser 3.82 (H) 0.44 - 1.00 mg/dL   Calcium 8.5 (L) 8.9 - 10.3 mg/dL   GFR calc non Af Amer 11 (L) >60 mL/min   GFR calc Af Amer 13 (L) >60 mL/min   Anion gap 16 (H) 5 - 15    Comment: Performed at [facility] [address], [place], [place] [address]  Glucose, capillary     Status: Abnormal   Collection Time: 06/06/19  3:00 PM  Result Value Ref Range   Glucose-Capillary 108 (H) 70 - 99 mg/dL  .Cooxemetry Panel (carboxy, met, total hgb, O2 sat)     Status: Abnormal   Collection Time: 06/06/19  6:06 PM  Result Value Ref Range   Total hemoglobin 7.6 (L) 12.0 - 16.0 g/dL    O2 Saturation 89.3 %   Carboxyhemoglobin 1.5 0.5 - 1.5 %   Methemoglobin 0.8 0.0 - 1.5 %    Comment: Performed at [facility] [address], [place], [place] [address]  Basic metabolic panel     Status: Abnormal   Collection Time: 06/06/19  6:27 PM  Result Value Ref Range   Sodium 128 (L) 135 - 145 mmol/L   Potassium 5.7 (H) 3.5 - 5.1 mmol/L   Chloride 89 (L) 98 - 111 mmol/L   CO2 26 22 - 32 mmol/L   Glucose, Bld 113 (H) 70 - 99 mg/dL   BUN 103 (H) 8 - 23 mg/dL   Creatinine, Ser 3.82 (H) 0.44 - 1.00 mg/dL   Calcium 8.3 (L) 8.9 - 10.3 mg/dL   GFR calc non Af Amer 11 (L) >60 mL/min   GFR calc Af Amer 13 (L) >60 mL/min   Anion gap 13 5 - 15    Comment: Performed at [facility] [address] [place], [place] [address]  Glucose, capillary     Status: Abnormal   Collection Time: 06/06/19  8:04 PM  Result Value Ref Range  Glucose-Capillary 142 (H) 70 - 99 mg/dL  Glucose, capillary     Status: Abnormal   Collection Time: 06/07/19 12:04 AM  Result Value Ref Range   Glucose-Capillary 125 (H) 70 - 99 mg/dL  Glucose, capillary     Status: Abnormal   Collection Time: 06/07/19  3:54 AM  Result Value Ref Range   Glucose-Capillary 153 (H) 70 - 99 mg/dL  CBC     Status: Abnormal   Collection Time: 06/07/19  5:08 AM  Result Value Ref Range   WBC 8.7 4.0 - 10.5 K/uL   RBC 3.35 (L) 3.87 - 5.11 MIL/uL   Hemoglobin 8.2 (L) 12.0 - 15.0 g/dL   HCT 28.1 (L) 36.0 - 46.0 %   MCV 83.9 80.0 - 100.0 fL   MCH 24.5 (L) 26.0 - 34.0 pg   MCHC 29.2 (L) 30.0 - 36.0 g/dL   RDW 16.1 (H) 11.5 - 15.5 %   Platelets 176 150 - 400 K/uL   nRBC 0.2 0.0 - 0.2 %    Comment: Performed at [facility], [place] [address], [place], [place] [address]  Basic metabolic panel     Status: Abnormal   Collection Time: 06/07/19  5:08 AM  Result Value Ref Range   Sodium 126 (L) 135 - 145 mmol/L   Potassium 5.6 (H) 3.5 - 5.1 mmol/L   Chloride 87 (L) 98 - 111 mmol/L   CO2 25 22 - 32 mmol/L   Glucose,  Bld 132 (H) 70 - 99 mg/dL   BUN 106 (H) 8 - 23 mg/dL   Creatinine, Ser 4.07 (H) 0.44 - 1.00 mg/dL   Calcium 8.1 (L) 8.9 - 10.3 mg/dL   GFR calc non Af Amer 10 (L) >60 mL/min   GFR calc Af Amer 12 (L) >60 mL/min   Anion gap 14 5 - 15    Comment: Performed at [facility] [address] [place], [place] [address]  Iron and TIBC     Status: Abnormal   Collection Time: 06/07/19  7:38 AM  Result Value Ref Range   Iron 13 (L) 28 - 170 ug/dL   TIBC 423 250 - 450 ug/dL   Saturation Ratios 3 (L) 10.4 - 31.8 %   UIBC 410 ug/dL    Comment: Performed at [facility], [place] [address] [place], [place] [address]  Urinalysis, Routine w reflex microscopic     Status: Abnormal   Collection Time: 06/07/19  7:40 AM  Result Value Ref Range   Color, Urine AMBER (A) YELLOW    Comment: BIOCHEMICALS MAY BE AFFECTED BY COLOR   APPearance CLOUDY (A) CLEAR   Specific Gravity, Urine 1.010 1.005 - 1.030   pH 5.0 5.0 - 8.0   Glucose, UA NEGATIVE NEGATIVE mg/dL   Hgb urine dipstick MODERATE (A) NEGATIVE   Bilirubin Urine NEGATIVE NEGATIVE   Ketones, ur NEGATIVE NEGATIVE mg/dL   Protein, ur 100 (A) NEGATIVE mg/dL   Nitrite NEGATIVE NEGATIVE   Leukocytes,Ua LARGE (A) NEGATIVE   RBC / HPF >50 (H) 0 - 5 RBC/hpf   WBC, UA >50 (H) 0 - 5 WBC/hpf   Bacteria, UA MANY (A) NONE SEEN   Mucus PRESENT    Hyaline Casts, UA PRESENT     Comment: Performed at [facility], [address], [place], [place] [address]  Glucose, capillary     Status: Abnormal   Collection Time: 06/07/19  8:31 AM  Result Value Ref Range  Glucose-Capillary 128 (H) 70 - 99 mg/dL  Glucose, capillary     Status: Abnormal   Collection Time: 06/07/19 12:01 PM  Result Value Ref Range   Glucose-Capillary 125 (H) 70 - 99 mg/dL  Basic metabolic panel     Status: Abnormal   Collection Time: 06/07/19  4:00 PM  Result Value Ref Range   Sodium 128 (L) 135 - 145 mmol/L   Potassium 5.5 (H) 3.5 - 5.1 mmol/L   Chloride 89 (L) 98 - 111  mmol/L   CO2 23 22 - 32 mmol/L   Glucose, Bld 128 (H) 70 - 99 mg/dL   BUN 109 (H) 8 - 23 mg/dL   Creatinine, Ser 4.43 (H) 0.44 - 1.00 mg/dL   Calcium 8.1 (L) 8.9 - 10.3 mg/dL   GFR calc non Af Amer 9 (L) >60 mL/min   GFR calc Af Amer 11 (L) >60 mL/min   Anion gap 16 (H) 5 - 15    Comment: Performed at [facility] [address] [place], [place] [address]  Glucose, capillary     Status: Abnormal   Collection Time: 06/07/19  4:05 PM  Result Value Ref Range   Glucose-Capillary 116 (H) 70 - 99 mg/dL  Glucose, capillary     Status: Abnormal   Collection Time: 06/07/19  8:44 PM  Result Value Ref Range   Glucose-Capillary 125 (H) 70 - 99 mg/dL  Glucose, capillary     Status: Abnormal   Collection Time: 06/07/19 11:40 PM  Result Value Ref Range   Glucose-Capillary 128 (H) 70 - 99 mg/dL  Glucose, capillary     Status: Abnormal   Collection Time: 06/08/19  3:18 AM  Result Value Ref Range   Glucose-Capillary 105 (H) 70 - 99 mg/dL  CBC     Status: Abnormal   Collection Time: 06/08/19  4:31 AM  Result Value Ref Range   WBC 9.4 4.0 - 10.5 K/uL   RBC 3.48 (L) 3.87 - 5.11 MIL/uL   Hemoglobin 8.5 (L) 12.0 - 15.0 g/dL   HCT 29.1 (L) 36.0 - 46.0 %   MCV 83.6 80.0 - 100.0 fL   MCH 24.4 (L) 26.0 - 34.0 pg   MCHC 29.2 (L) 30.0 - 36.0 g/dL   RDW 16.3 (H) 11.5 - 15.5 %   Platelets 191 150 - 400 K/uL   nRBC 0.2 0.0 - 0.2 %    Comment: Performed at [facility], [place] [address] [place], [place] [address]  Basic metabolic panel     Status: Abnormal   Collection Time: 06/08/19  4:31 AM  Result Value Ref Range   Sodium 129 (L) 135 - 145 mmol/L   Potassium 5.3 (H) 3.5 - 5.1 mmol/L   Chloride 88 (L) 98 - 111 mmol/L   CO2 23 22 - 32 mmol/L   Glucose, Bld 104 (H) 70 - 99 mg/dL   BUN 108 (H) 8 - 23 mg/dL   Creatinine, Ser 4.38 (H) 0.44 - 1.00 mg/dL   Calcium 8.0 (L) 8.9 - 10.3 mg/dL   GFR calc non Af Amer 10 (L) >60 mL/min   GFR calc Af Amer 11 (L) >60 mL/min   Anion gap 18 (H) 5 -  15    Comment: Performed at [facility] [address], [place], [place] [address]  Brain natriuretic peptide     Status: Abnormal   Collection Time: 06/08/19  4:31 AM  Result Value Ref Range   B Natriuretic Peptide  795.3 (H) 0.0 - 100.0 pg/mL    Comment: Performed at [facility], [place] [address] [place], [place] [address]  I-STAT 7, (LYTES, BLD GAS, ICA, H+H)     Status: Abnormal   Collection Time: 06/08/19  4:42 AM  Result Value Ref Range   pH, Arterial 7.273 (L) 7.350 - 7.450   pCO2 arterial 54.5 (H) 32.0 - 48.0 mmHg   pO2, Arterial 125.0 (H) 83.0 - 108.0 mmHg   Bicarbonate 25.2 20.0 - 28.0 mmol/L   TCO2 27 22 - 32 mmol/L   O2 Saturation 98.0 %   Acid-base deficit 2.0 0.0 - 2.0 mmol/L   Sodium 128 (L) 135 - 145 mmol/L   Potassium 5.3 (H) 3.5 - 5.1 mmol/L   Calcium, Ion 1.04 (L) 1.15 - 1.40 mmol/L   HCT 31.0 (L) 36.0 - 46.0 %   Hemoglobin 10.5 (L) 12.0 - 15.0 g/dL   Patient temperature HIDE    Sample type ARTERIAL   Glucose, capillary     Status: Abnormal   Collection Time: 06/08/19  8:03 AM  Result Value Ref Range   Glucose-Capillary 120 (H) 70 - 99 mg/dL     ROS: General weakness fatigue no fever sweats chills Eyes no visual complaints Ears nose mouth throat no hearing loss epistaxis sore throat Cardiovascular orthopnea shortness of breath on exertion peripheral edema present Pulmonary no cough wheeze hemoptysis GI no vomiting diarrhea abdominal pain GU no hematuria dysuria nocturia Neurologic no headache double vision blurred vision no weakness dysarthria dysphagia dysphonia Endocrine diabetes no history of thyroid abnormalities Dermatologic no skin rash or itching Psychiatric positive for anxiety     Physical Exam: Vitals:   06/08/19 0800 06/08/19 0900  BP:    Pulse: 67 74  Resp: 16 (!) 27  Temp:    SpO2: 97% 96%     General: Short of breath using oxygen HEENT: Normocephalic atraumatic Eyes: No icterus no pallor Neck: Elevated JVP  carotids 2+ bilateral no lymphadenopathy or thyromegaly Heart: Regular rate and rhythm no murmurs rubs gallops right upper chest wall Port-A-Cath Lungs: Clear to auscultation 5 L nasal cannula Abdomen: Soft nontender bowel sounds present organosplenomegaly Extremities: No cyanosis clubbing or rash 2-3+ pitting edema with dressings in place Skin: No skin rashes or lesions noted Neuro: Alert nondistressed moving all 4 extremities nonfocal  Assessment/Plan: 1.Renal-acute on chronic renal insufficiency baseline serum creatinine does appear to be at 1.1 2020.  There has been progressive rise of her creatinine since admission.  She has been placed on IV diuretics but poorly responsive.  She developed some systemic hypotension that is concerning especially in the setting of worsening shortness of breath.  She is now requiring IV pressors.  We will obtain a renal ultrasound to rule out obstruction.  We will send off urinalysis.  Urine sodium is less than 10 in the setting of intravascular depletion.  Now requiring pressors.  I doubt this represents acute interstitial nephritis or an acute glomerulonephritis but is the result of systemic hypotension and poor renal perfusion.  Question would be with place patient on continuous renal replacement therapy or transition patient to comfort care.  It appears that we are not going to escalate care at this point. 2. Hypertension/volume  -massive volume overload with poor response to intravenous Lasix.   3.  Diabetes mellitus as per primary service 4.  Anemia we will send off iron studies 5.  History of breast cancer status postmastectomy left side status post biopsy followed by Dr.  McCarty in [place] every 3 months 6.  Paroxysmal atrial fibrillation patient on amiodarone 7.  Pulmonary hypertension severe elevated pressures with negative serological work-up in June 2020.  This appears to be primary pulmonary hypertension  Prognosis appears to be guarded at this  point.  There is nothing more to be added from nephrology standpoint.  I appreciate the assistance from Dr. Aundra Dubin and from [place] Ferolito.  We will sign off    Sherril Croon 06/08/2019, 9:39 AM

## 2019-06-09 DEATH — deceased

## 2019-06-10 ENCOUNTER — Telehealth: Payer: Self-pay

## 2019-06-10 NOTE — Telephone Encounter (Signed)
Received a phone call from [facility].  The dc should be signed by Doctor Byrum.  I told Randall Hiss to mail it to the pulmonary office on [address].

## 2019-06-25 DIAGNOSIS — R570 Cardiogenic shock: Secondary | ICD-10-CM | POA: Diagnosis present

## 2019-06-25 DIAGNOSIS — A419 Sepsis, unspecified organism: Secondary | ICD-10-CM | POA: Diagnosis present

## 2019-06-25 DIAGNOSIS — G92 Toxic encephalopathy: Secondary | ICD-10-CM | POA: Diagnosis not present

## 2019-06-25 DIAGNOSIS — R6521 Severe sepsis with septic shock: Secondary | ICD-10-CM | POA: Diagnosis present

## 2019-06-25 DIAGNOSIS — B9629 Other Escherichia coli [E. coli] as the cause of diseases classified elsewhere: Secondary | ICD-10-CM | POA: Diagnosis present

## 2019-06-25 DIAGNOSIS — G928 Other toxic encephalopathy: Secondary | ICD-10-CM | POA: Diagnosis not present

## 2019-07-07 NOTE — Death Summary Note (Signed)
DEATH SUMMARY   Patient Details  Name: Tammy Mathews MRN: [id number] DOB: [birth year]  Admission/Discharge Information   Admit Date:  Jul 04, 2019  Date of Death: Date of Death: Jul 09, 2019  Time of Death: Time of Death: 45  Length of Stay: 5  Referring Physician: Greig Right, MD   Reason(s) for Hospitalization  Syncopal episode  Diagnoses  Preliminary cause of death:   Acute on chronic right heart failure, cor pulmonale with cardiogenic shock and acute respiratory failure.   Secondary Diagnoses (including complications and co-morbidities):  Principal Problem:   Acute on chronic respiratory failure with hypoxia and hypercapnia (HCC) Active Problems:   Asthma   Morbid obesity due to excess calories (HCC)   Chronic respiratory failure with hypoxia and hypercapnia (HCC)   Adult BMI 45.0-49.9 kg/sq m (HCC)   Type 2 diabetes mellitus with complication (HCC)   Persistent atrial fibrillation (HCC)   Pulmonary hypertension (HCC)   COPD (chronic obstructive pulmonary disease) (HCC)   Acute-on-chronic renal failure (HCC)   Right ventricular dilation   Cardiac syncope   Cor pulmonale, acute ([place])   Cardiogenic shock (HCC)   Septic shock (HCC)   UTI due to extended-spectrum beta lactamase (ESBL) producing Escherichia coli   Toxic metabolic encephalopathy   Brief Hospital Course (including significant findings, care, treatment, and services provided and events leading to death)  Tammy Mathews is a 69 y.o. year old female, chronically ill with OSA/obesity hypoventilation syndrome, atrial fibrillation, hypertension with diastolic dysfunction and diastolic CHF, chronic hypoxemic and hypercapnic respiratory failure, chronic renal insufficiency stage III.  She had severe secondary PAH, on ambrisentan.  She was brought urgently to [facility] on 07/03/2022 after acute loss of consciousness.  She received brief CPR although she recalled these events and was ultimately felt to have experienced  syncope.  She had been developing progressive dyspnea and lower extremity edema in the days leading up to these events.  She was found to be hypercapnic and hypoxemic, started on BiPAP.  Transferred to [facility].  She developed shock, evolving and persistent combined respiratory failure, acute on chronic (stage III) renal failure.  Her urine cultures from [place] grew multidrug-resistant E. coli and she was started on imipenem.  It was felt that her shock was combined septic shock due to the UTI as well as decompensated right heart failure and cor pulmonale.  Efforts were made at diuresis on pressors and dobutamine.  Her amiodarone was continued for atrial fibrillation which was adequately rate controlled.  Anticoagulation was deferred given her history of epistaxis.  Her diabetes was managed with sliding scale insulin Lantus and remained adequately controlled.  Despite this care plan her renal function continue to worsen as did her shock and ultimately her mental status.  She developed a toxic metabolic encephalopathy.  She remained on BiPAP, pressors, dobutamine with worsening renal function.  Discussions were undertaken with the patient's sister, [name], and it was acknowledged that the patient likely could not tolerate and would not ultimately benefit from hemodialysis.  For this reason HD was deferred.  It was felt given her chronic illnesses, chronic functional capacity and failure to stabilize that chances for meaningful survival were very small.  CODE STATUS was changed to DNR and the patient was transition to comfort care on 2022/07/07.  She died on July 09, 2019.    Pertinent Labs and Studies  Significant Diagnostic Studies EEG  Result Date: 06/05/2019 Tammy Havens, MD     06/05/2019  4:03 PM Patient Name: Tammy Mathews MRN: [id number]  Epilepsy Attending: Lora Mathews Referring Physician/Provider: Dr Lars Mage Date: 06/05/2019 Duration: 2 Patient history: 69 year old immunocompromise female with  history of pulmonary hypertension who presented with transient loss of awareness thought initially to be in setting of acute on chronic hypoxic respiratory failure.  EEG to evaluate for seizures. Level of alertness: awake AEDs during EEG study: None Technical aspects: This EEG study was done with scalp electrodes positioned according to the 10-20 International system of electrode placement. Electrical activity was acquired at a sampling rate of _0  and reviewed with a high frequency filter of _1  and a low frequency filter of _2 . EEG data were recorded continuously and digitally stored. Description: During awake state, no clear posterior dominant rhythm was seen.  EEG showed continuous generalized 3 to 6 Hz theta-delta slowing as well as intermittent rhythmic generalized 2 to 3 Hz delta slowing.  Hyperventilation photic stimulation not performed due to COPD and patient unable to tolerate the study. Of note, study was technically difficult due to significant myogenic artifact. Abnormality -Continuous slow, generalized -Intermittent rhythmic slow, generalized IMPRESSION: This technically difficult study is suggestive of mild to moderate diffuse encephalopathy, nonspecific etiology. No seizures or epileptiform discharges were seen throughout the recording. Tammy Mathews   DG CHEST PORT 1 VIEW  Result Date: 06/06/2019 CLINICAL DATA:  Chest pain and shortness of breath. EXAM: PORTABLE CHEST 1 VIEW COMPARISON:  06/02/2019 FINDINGS: The patient is rotated to the right with grossly unchanged cardiomediastinal silhouette. Cardiomegaly is again noted. A right subclavian Port-A-Cath terminates over the SVC, unchanged. Lung volumes remain low with elevation of the right hemidiaphragm. Curvilinear opacities in the right lung base and mid lungs bilaterally are similar to the prior study. No large pleural effusion or pneumothorax is identified. IMPRESSION: Low lung volumes with bilateral lung scarring and/or  atelectasis. Electronically Signed   By: Logan Bores M.D.   On: 06/06/2019 10:31   ECHOCARDIOGRAM COMPLETE  Result Date: 06/05/2019   ECHOCARDIOGRAM REPORT   Patient Name:   JEANINE CAVEN Date of Exam: 06/05/2019 Medical Rec #:  [id number]     Height:       62.0 in Accession #:    [id number]    Weight:       272.0 lb Date of Birth:  December 02, 1950    BSA:          2.18 m Patient Age:    73 years      BP:           129/59 mmHg Patient Gender: F             HR:           69 bpm. Exam Location:  Inpatient Procedure: 2D Echo Indications:    Pulmonary hypertension 416.8 / I27.2  History:        Patient has prior history of Echocardiogram examinations, most                 recent 10/28/2018. CHF, COPD; Risk Factors:Hypertension, Diabetes                 and Dyslipidemia. Chronic respiratory failure                 Apnea                 Cor pulmonale (chronic)                 CKD  AKI.  Sonographer:    Chelsea Turrentine Referring Phys: [id number] PAULA B SIMPSON  Sonographer Comments: Technically difficult subcostal views due to patient respiratory rate interfering with image. IMPRESSIONS  1. Left ventricular ejection fraction, by visual estimation, is 55 to 60%. The left ventricle has normal function. Left ventricular septal wall thickness was normal. Normal left ventricular posterior wall thickness. There is no left ventricular hypertrophy.  2. Left ventricular diastolic parameters are indeterminate.  3. The left ventricle has no regional wall motion abnormalities.  4. Global right ventricle has normal systolic function.The right ventricular size is moderately enlarged. No increase in right ventricular wall thickness.  5. Right ventricle findings are consistent with pulmonary hypertension.  6. Left atrial size was mildly dilated.  7. Right atrial size was mild-moderately dilated.  8. The mitral valve is normal in structure. Trivial mitral valve regurgitation. No evidence of mitral stenosis.  9. The tricuspid  valve is normal in structure. 10. The tricuspid valve is normal in structure. Tricuspid valve regurgitation is moderate. 11. The aortic valve is tricuspid. Aortic valve regurgitation is not visualized. Mild aortic valve sclerosis without stenosis. 12. The pulmonic valve was normal in structure. Pulmonic valve regurgitation is trivial. 13. Severely elevated pulmonary artery systolic pressure. 14. The inferior vena cava is dilated in size with <50% respiratory variability, suggesting right atrial pressure of 15 mmHg. FINDINGS  Left Ventricle: Left ventricular ejection fraction, by visual estimation, is 55 to 60%. The left ventricle has normal function. The left ventricle has no regional wall motion abnormalities. The left ventricular internal cavity size was the left ventricle is normal in size. Normal left ventricular posterior wall thickness. There is no left ventricular hypertrophy. Left ventricular diastolic parameters are indeterminate. Normal left atrial pressure. Right Ventricle: The right ventricular size is moderately enlarged. No increase in right ventricular wall thickness. Global RV systolic function is has normal systolic function. The tricuspid regurgitant velocity is 4.03 m/s, and with an assumed right atrial pressure of 15 mmHg, the estimated right ventricular systolic pressure is severely elevated at 80.0 mmHg. Right ventricle findings are consistent with pulmonary hypertension. Interventricular septum flattening noted in systole, consisten with right ventriclar pressure overload. Left Atrium: Left atrial size was mildly dilated. Right Atrium: Right atrial size was mild-moderately dilated Pericardium: There is no evidence of pericardial effusion. Mitral Valve: The mitral valve is normal in structure. Trivial mitral valve regurgitation. No evidence of mitral valve stenosis by observation. Tricuspid Valve: The tricuspid valve is normal in structure. Tricuspid valve regurgitation is moderate. Aortic  Valve: The aortic valve is tricuspid. Aortic valve regurgitation is not visualized. Mild aortic valve sclerosis is present, with no evidence of aortic valve stenosis. Aortic valve mean gradient measures 9.0 mmHg. Aortic valve peak gradient measures 15.5 mmHg. Aortic valve area, by VTI measures 1.99 cm. Pulmonic Valve: The pulmonic valve was normal in structure. Pulmonic valve regurgitation is trivial. Pulmonic regurgitation is trivial. Aorta: The aortic root, ascending aorta and aortic arch are all structurally normal, with no evidence of dilitation or obstruction. Venous: The inferior vena cava is dilated in size with less than 50% respiratory variability, suggesting right atrial pressure of 15 mmHg. IAS/Shunts: No atrial level shunt detected by color flow Doppler. There is no evidence of a patent foramen ovale. No ventricular septal defect is seen or detected. There is no evidence of an atrial septal defect.  LEFT VENTRICLE PLAX 2D LVIDd:         4.60 cm LVIDs:  3.20 cm LV PW:         0.90 cm LV IVS:        1.00 cm LVOT diam:     1.80 cm LV SV:         56 ml LV SV Index:   23.46 LVOT Area:     2.54 cm  RIGHT VENTRICLE RV S prime:     13.90 cm/s LEFT ATRIUM             Index       RIGHT ATRIUM           Index LA diam:        5.00 cm 2.29 cm/m  RA Area:     23.10 cm LA Vol (A2C):   76.1 ml 34.93 ml/m RA Volume:   73.10 ml  33.55 ml/m LA Vol (A4C):   55.5 ml 25.47 ml/m LA Biplane Vol: 67.0 ml 30.75 ml/m  AORTIC VALVE AV Area (Vmax):    1.94 cm AV Area (Vmean):   2.07 cm AV Area (VTI):     1.99 cm AV Vmax:           196.67 cm/s AV Vmean:          138.000 cm/s AV VTI:            0.407 m AV Peak Grad:      15.5 mmHg AV Mean Grad:      9.0 mmHg LVOT Vmax:         150.00 cm/s LVOT Vmean:        112.000 cm/s LVOT VTI:          0.318 m LVOT/AV VTI ratio: 0.78  AORTA Ao Root diam: 2.40 cm MITRAL VALVE                         TRICUSPID VALVE MV Area (PHT): 3.77 cm              TR Peak grad:   65.0 mmHg MV  PHT:        58.29 msec            TR Vmax:        403.00 cm/s MV Decel Time: 201 msec MV E velocity: 206.00 cm/s 103 cm/s  SHUNTS MV A velocity: 87.40 cm/s  70.3 cm/s Systemic VTI:  0.32 m MV E/A ratio:  2.36        1.5       Systemic Diam: 1.80 cm  Skeet Latch MD Electronically signed by Skeet Latch MD Signature Date/Time: 06/05/2019/1:05:34 PM    Final    VAS [place] LOWER EXTREMITY VENOUS (DVT)  Result Date: 06/05/2019  Lower Venous Study Indications: Edema.  Risk Factors: Pulmonary hypertension. Limitations: Body habitus, poor ultrasound/tissue interface, bandages and significant pitting edema. Comparison Study: Prior study from 10/28/18 is available for comparison Performing Technologist: Sharion Dove RVS  Examination Guidelines: A complete evaluation includes B-mode imaging, spectral Doppler, color Doppler, and power Doppler as needed of all accessible portions of each vessel. Bilateral testing is considered an integral part of a complete examination. Limited examinations for reoccurring indications may be performed as noted.  +---------+---------------+---------+-----------+----------+-------------------+ RIGHT    CompressibilityPhasicitySpontaneityPropertiesThrombus Aging      +---------+---------------+---------+-----------+----------+-------------------+ CFV      Full           Yes      Yes                                      +---------+---------------+---------+-----------+----------+-------------------+  SFJ      Full                                                             +---------+---------------+---------+-----------+----------+-------------------+ FV Prox                 Yes      Yes                  patent by color and                                                       Doppler             +---------+---------------+---------+-----------+----------+-------------------+ FV Mid                  Yes      Yes                  patent by color and                                                        Doppler             +---------+---------------+---------+-----------+----------+-------------------+ FV Distal               Yes      Yes                  patent by color and                                                       Doppler             +---------+---------------+---------+-----------+----------+-------------------+ PFV                                                   Not visualized      +---------+---------------+---------+-----------+----------+-------------------+ POP      Full           Yes      Yes                                      +---------+---------------+---------+-----------+----------+-------------------+ PTV                     Yes      Yes                  not all segments  visualized, patent                                                        by color and                                                              Doppler             +---------+---------------+---------+-----------+----------+-------------------+ PERO                                                  Not visualized      +---------+---------------+---------+-----------+----------+-------------------+   +---------+---------------+---------+-----------+----------+-------------------+ LEFT     CompressibilityPhasicitySpontaneityPropertiesThrombus Aging      +---------+---------------+---------+-----------+----------+-------------------+ CFV      Full           Yes      Yes                                      +---------+---------------+---------+-----------+----------+-------------------+ SFJ      Full                                                             +---------+---------------+---------+-----------+----------+-------------------+ FV Prox                 Yes      Yes                  patent by color and                                                        Doppler             +---------+---------------+---------+-----------+----------+-------------------+ FV Mid                  Yes      Yes                  patent by color and                                                       Doppler             +---------+---------------+---------+-----------+----------+-------------------+ FV Distal               Yes      Yes  patent by color and                                                       Doppler             +---------+---------------+---------+-----------+----------+-------------------+ PFV                                                   Not visualized      +---------+---------------+---------+-----------+----------+-------------------+ POP                     Yes      Yes                  patent by color and                                                       Doppler             +---------+---------------+---------+-----------+----------+-------------------+ PTV                     Yes      Yes                  not all segments                                                          visualized, patent                                                        by color and                                                              Doppler             +---------+---------------+---------+-----------+----------+-------------------+ PERO                                                  Not visualized      +---------+---------------+---------+-----------+----------+-------------------+     Summary: Right: Findings appear essentially unchanged compared to previous examination. There is no evidence of deep vein thrombosis in the lower extremity. However, portions of this examination were limited- see technologist comments above. Left: Findings appear essentially unchanged compared to previous examination. There is  no evidence of deep vein  thrombosis in the lower extremity. However, portions of this examination were limited- see technologist comments above.  *See table(s) above for measurements and observations. Electronically signed by Monica Martinez MD on 06/05/2019 at 4:19:23 PM.    Final      Baltazar Apo, MD, PhD 06/25/2019, 11:03 AM [facility] [phone] or if no answer [phone]

## 2019-07-07 DEATH — deceased

## 2020-10-04 IMAGING — DX PORTABLE CHEST - 1 VIEW
1 series · 2 of 2 positions shown · non-contrast
Comparison: None.

CLINICAL DATA: 67-year-old female with acute respiratory distress.

EXAM:
PORTABLE CHEST 1 VIEW

[Series 1: chest ap · 0.14mm/px · 2 of 2 slices shown]
[im 1/2]
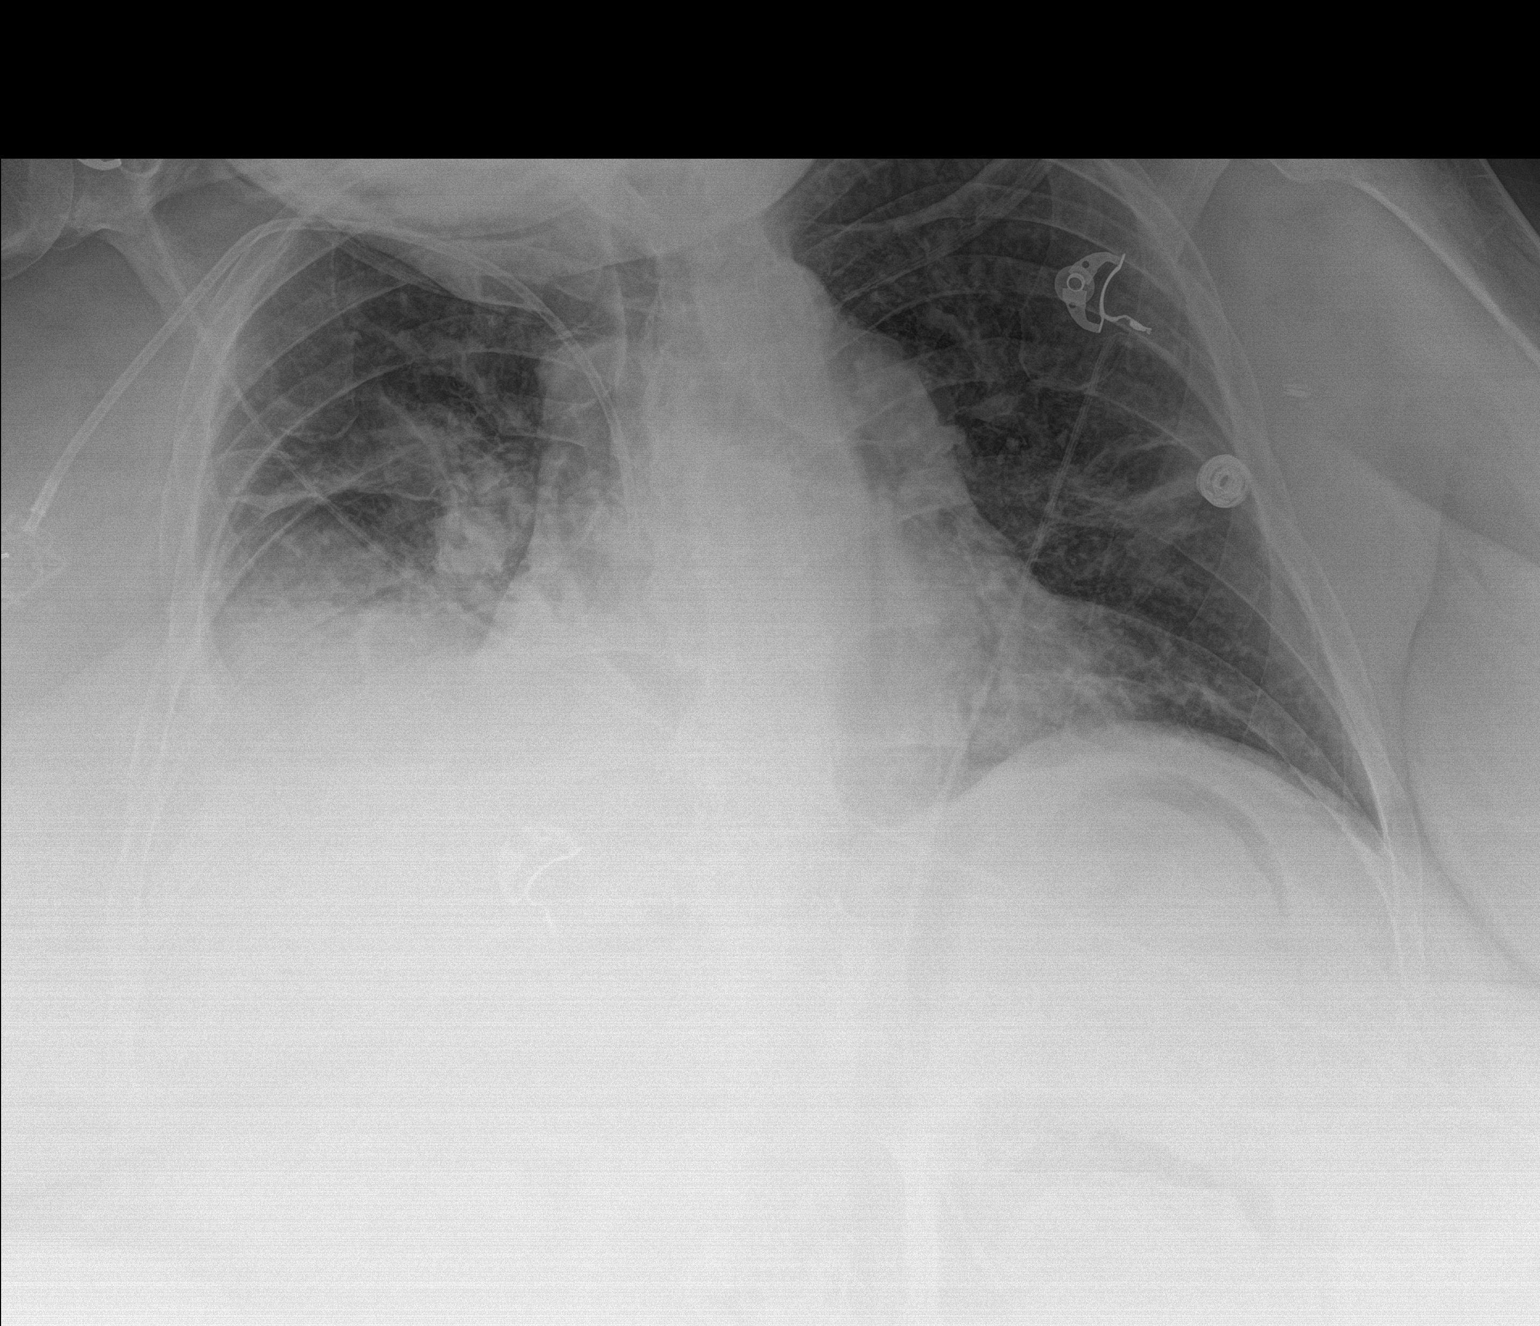
[im 2/2]
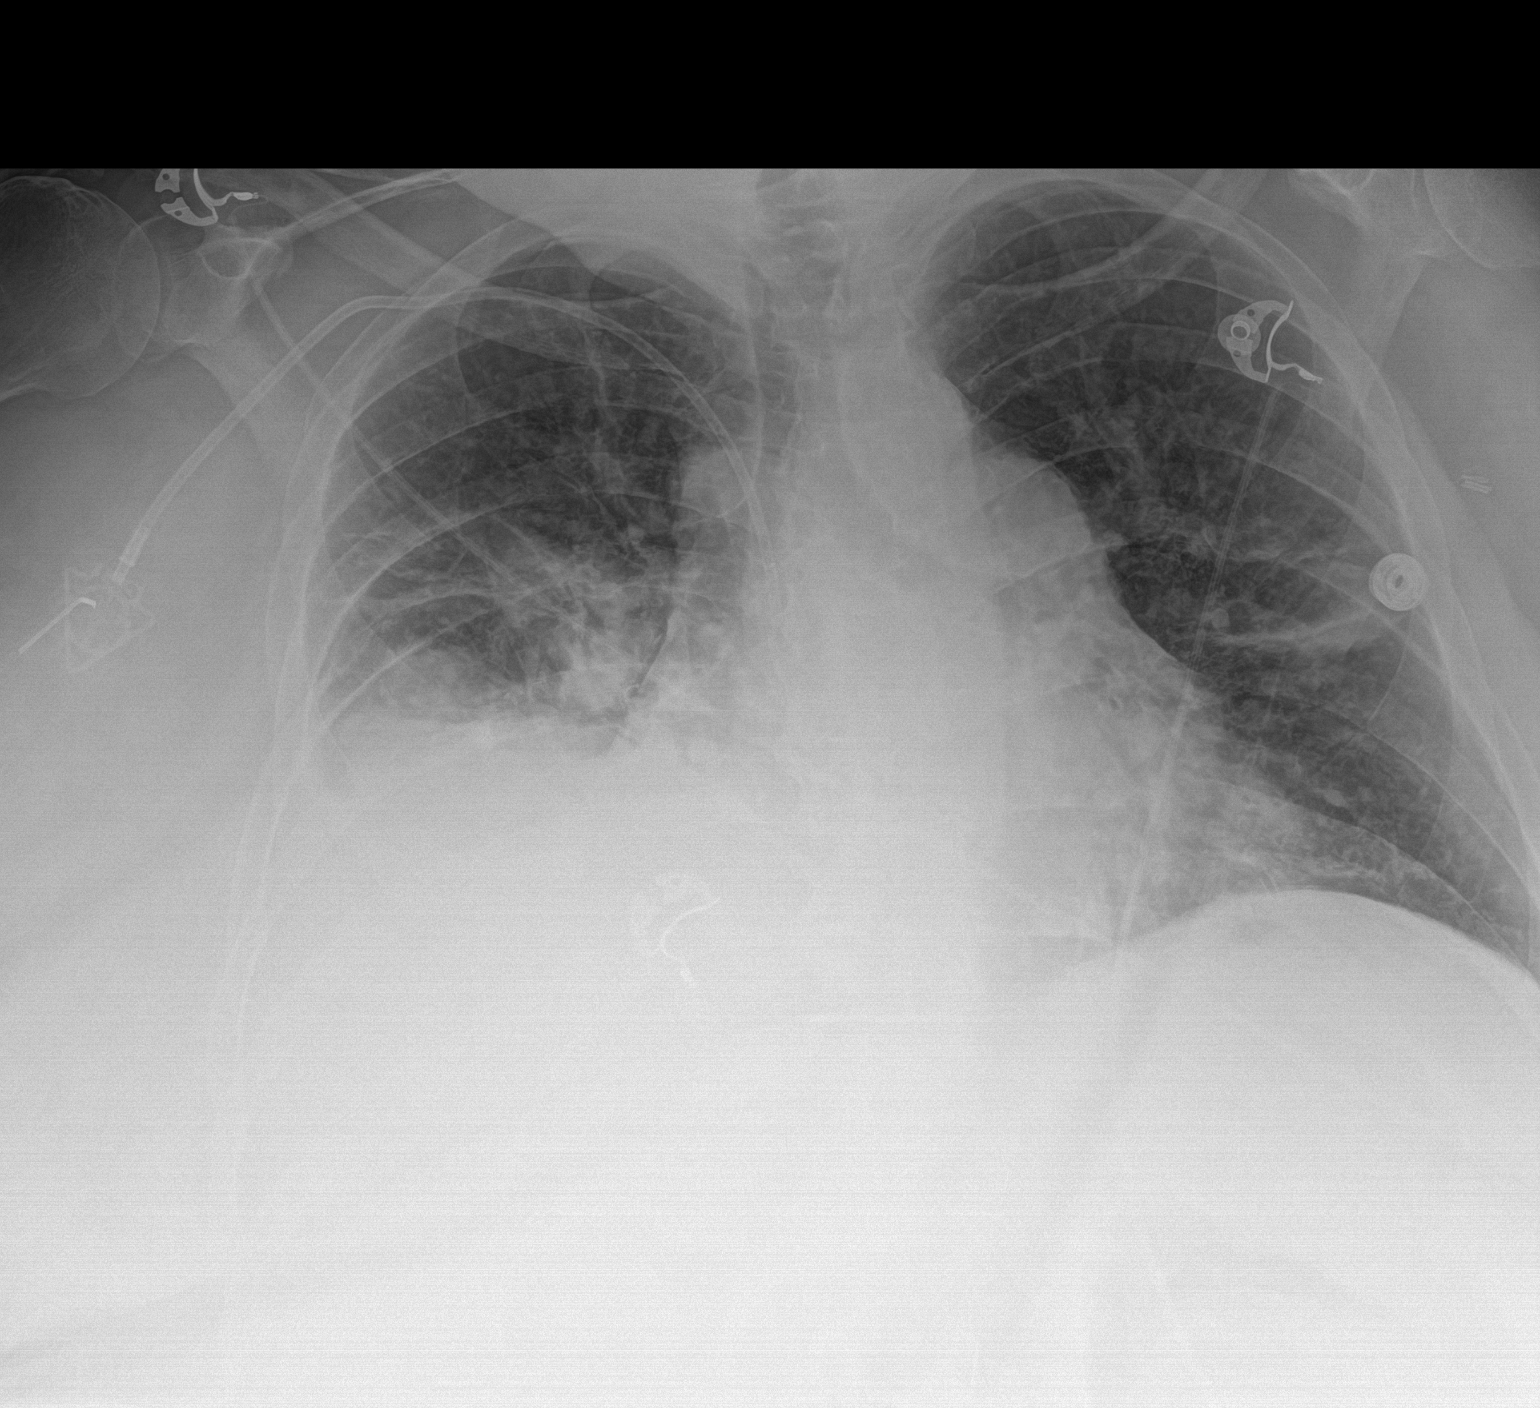

[2 of 2 positions shown; findings below may reference images not displayed]

FINDINGS: Right lung base densities may represent atelectasis or infiltrate.
Linear density in the left mid lung field may represent
atelectasis/scarring although infiltrate is not entirely excluded. A
small right pleural effusion may be present. There is no
pneumothorax. Mild cardiomegaly. Right pectoral Port-A-Cath tip
close to the cavoatrial junction. No acute osseous pathology.
IMPRESSION: 1. Right lung base atelectasis versus infiltrate.
2. Left mid lung field atelectasis/scarring versus infiltrate.
3. Possible small right pleural effusion.

## 2020-10-07 IMAGING — DX ORTHOPANTOGRAM/PANORAMIC
1 series · 1 of 1 positions shown · non-contrast
Comparison: None.

CLINICAL DATA: Chronic periodontitis.

EXAM:
ORTHOPANTOGRAM/PANORAMIC

[view not recorded]
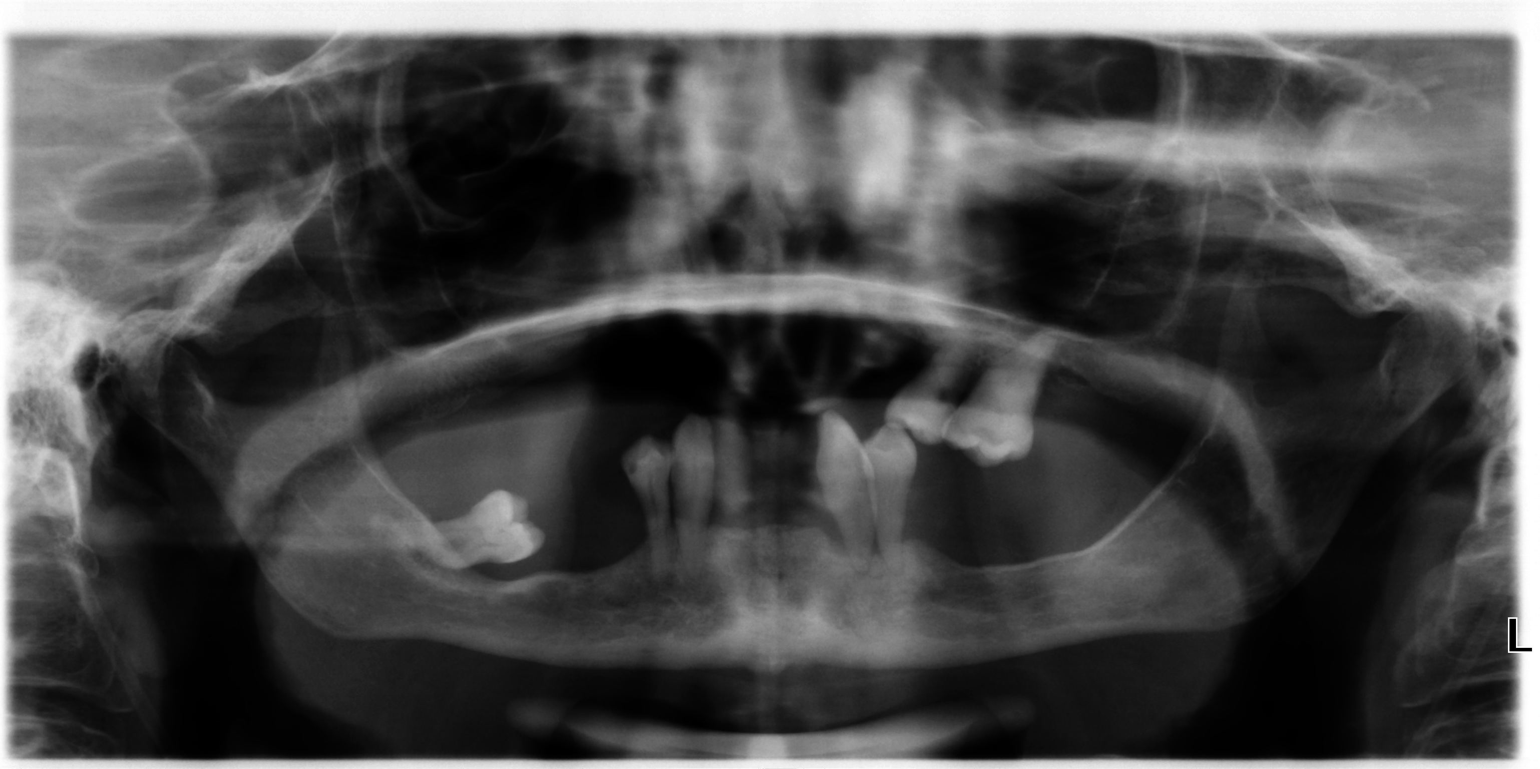

[1 of 1 positions shown; findings below may reference images not displayed]

FINDINGS: Poor dentition is noted. No fracture or dislocation is noted. No
lytic lesion is noted.
IMPRESSION: Poor dentition.  No significant mandibular abnormality is noted.

## 2020-10-08 IMAGING — DX PORTABLE CHEST - 1 VIEW
1 series · 1 of 1 positions shown · non-contrast
Comparison: Radiographs October 27, 2018.

CLINICAL DATA: Shortness of breath, cough.

EXAM:
PORTABLE CHEST 1 VIEW

[chest ap]
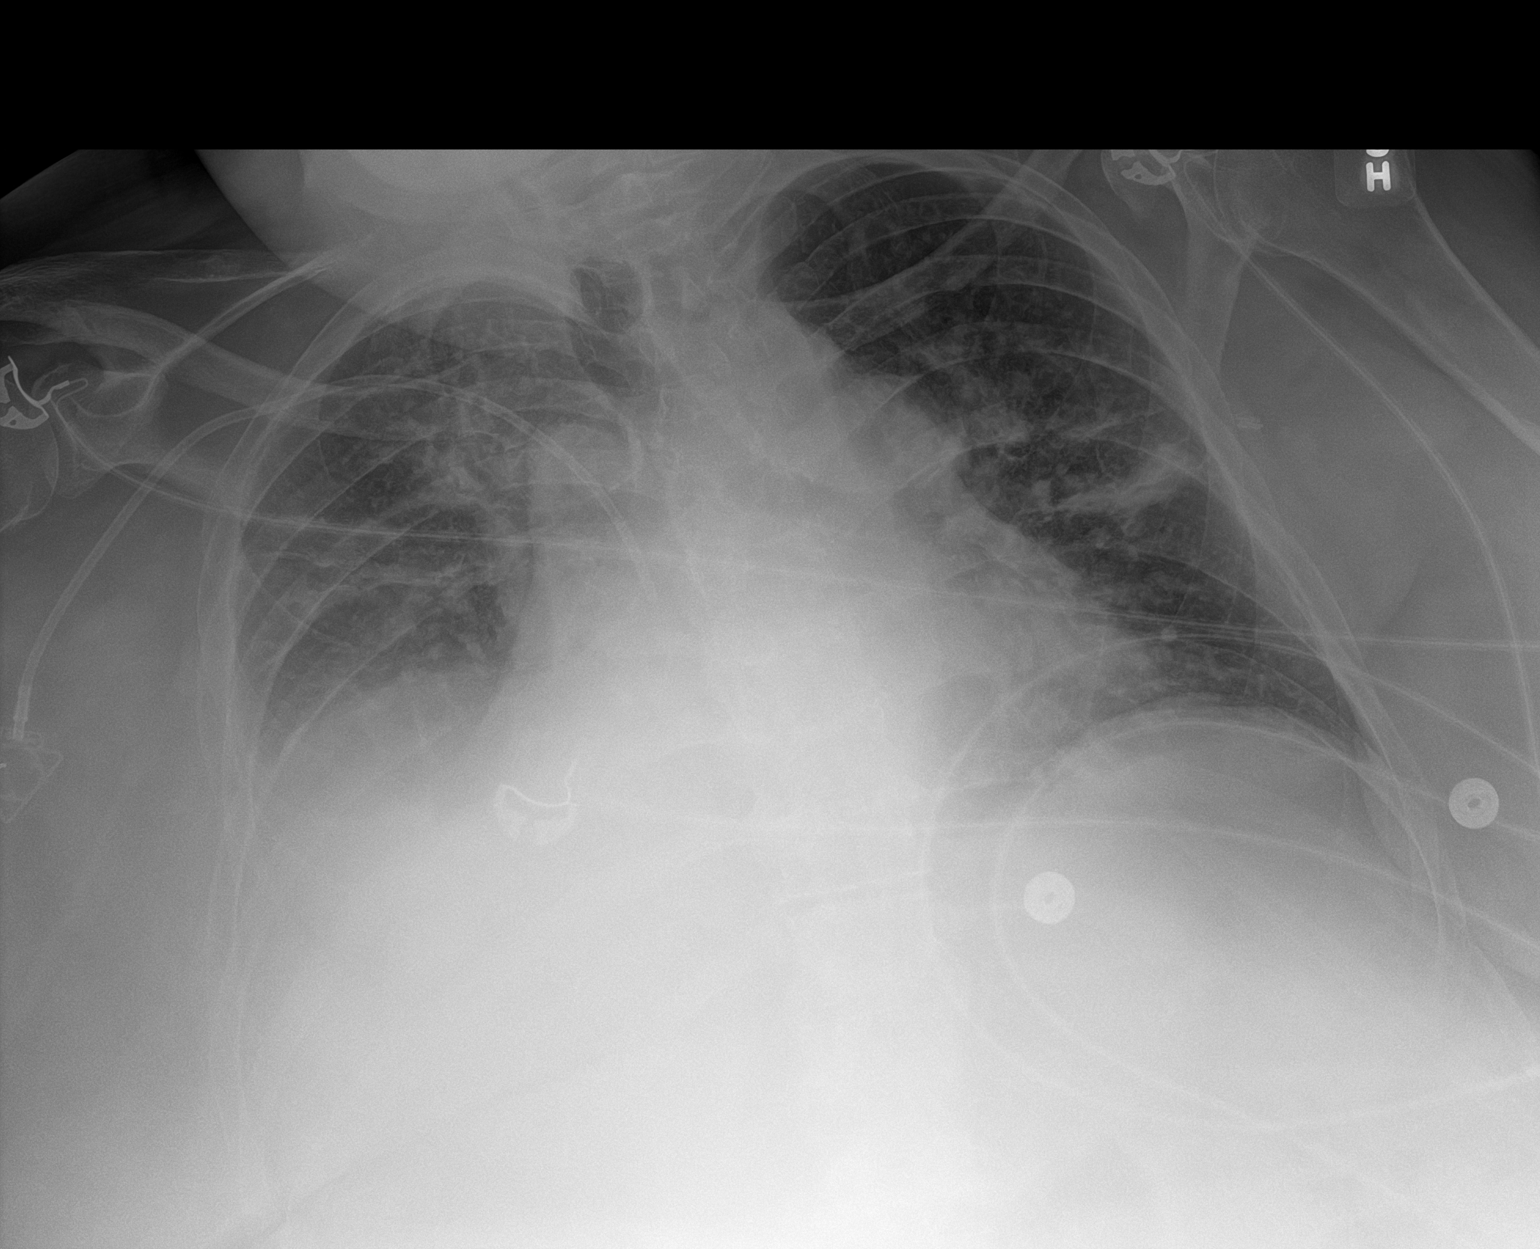

[1 of 1 positions shown; findings below may reference images not displayed]

FINDINGS: Stable cardiomediastinal silhouette. Right subclavian Port-A-Cath is
unchanged in position. No pneumothorax is noted. Stable bilateral
opacities are noted most consistent with subsegmental atelectasis
possibly infiltrates, right greater than left. Mild right pleural
effusion is noted. Bony thorax is unremarkable.
IMPRESSION: Stable bilateral lung opacities as described above with probable
small right pleural effusion.
